# Patient Record
Sex: Female | Born: 1970 | Race: White | Hispanic: No | Marital: Married | Smoking: Never smoker
Health system: Southern US, Community
[De-identification: ages and names within clinical notes are randomized; demographics above are authoritative.]

## PROBLEM LIST (undated history)

## (undated) ENCOUNTER — Ambulatory Visit: Payer: PRIVATE HEALTH INSURANCE

## (undated) ENCOUNTER — Encounter: Attending: Oncology | Primary: Oncology

## (undated) ENCOUNTER — Encounter

## (undated) ENCOUNTER — Ambulatory Visit

## (undated) ENCOUNTER — Encounter: Attending: Adult Health | Primary: Adult Health

## (undated) ENCOUNTER — Telehealth: Attending: Adult Health | Primary: Adult Health

## (undated) ENCOUNTER — Encounter: Attending: Primary Care | Primary: Primary Care

## (undated) ENCOUNTER — Telehealth
Attending: Pharmacist Clinician (PhC)/ Clinical Pharmacy Specialist | Primary: Pharmacist Clinician (PhC)/ Clinical Pharmacy Specialist

## (undated) ENCOUNTER — Telehealth: Attending: Internal Medicine | Primary: Internal Medicine

## (undated) ENCOUNTER — Encounter: Attending: Critical Care Medicine | Primary: Critical Care Medicine

## (undated) ENCOUNTER — Telehealth

## (undated) ENCOUNTER — Encounter: Attending: Internal Medicine | Primary: Internal Medicine

## (undated) ENCOUNTER — Encounter
Attending: Pharmacist Clinician (PhC)/ Clinical Pharmacy Specialist | Primary: Pharmacist Clinician (PhC)/ Clinical Pharmacy Specialist

## (undated) ENCOUNTER — Telehealth
Attending: Student in an Organized Health Care Education/Training Program | Primary: Student in an Organized Health Care Education/Training Program

## (undated) ENCOUNTER — Telehealth: Attending: Oncology | Primary: Oncology

## (undated) ENCOUNTER — Encounter: Attending: Nurse Practitioner | Primary: Nurse Practitioner

## (undated) ENCOUNTER — Inpatient Hospital Stay

## (undated) ENCOUNTER — Encounter: Attending: Family | Primary: Family

## (undated) ENCOUNTER — Ambulatory Visit: Payer: PRIVATE HEALTH INSURANCE | Attending: Adult Health | Primary: Adult Health

## (undated) ENCOUNTER — Ambulatory Visit: Payer: PRIVATE HEALTH INSURANCE | Attending: Internal Medicine | Primary: Internal Medicine

## (undated) ENCOUNTER — Ambulatory Visit: Payer: PRIVATE HEALTH INSURANCE | Attending: Clinical | Primary: Clinical

## (undated) ENCOUNTER — Ambulatory Visit: Attending: Internal Medicine | Primary: Internal Medicine

## (undated) DIAGNOSIS — N83209 Unspecified ovarian cyst, unspecified side: Secondary | ICD-10-CM

## (undated) DIAGNOSIS — D509 Iron deficiency anemia, unspecified: Secondary | ICD-10-CM

## (undated) DIAGNOSIS — N762 Acute vulvitis: Secondary | ICD-10-CM

## (undated) DIAGNOSIS — B977 Papillomavirus as the cause of diseases classified elsewhere: Secondary | ICD-10-CM

## (undated) DIAGNOSIS — D75839 Thrombocytosis, unspecified: Secondary | ICD-10-CM

## (undated) DIAGNOSIS — N6019 Diffuse cystic mastopathy of unspecified breast: Secondary | ICD-10-CM

## (undated) DIAGNOSIS — Z8619 Personal history of other infectious and parasitic diseases: Secondary | ICD-10-CM

## (undated) DIAGNOSIS — B3731 Acute candidiasis of vulva and vagina: Secondary | ICD-10-CM

## (undated) DIAGNOSIS — D7581 Myelofibrosis: Secondary | ICD-10-CM

## (undated) DIAGNOSIS — B379 Candidiasis, unspecified: Secondary | ICD-10-CM

## (undated) DIAGNOSIS — D473 Essential (hemorrhagic) thrombocythemia: Secondary | ICD-10-CM

## (undated) DIAGNOSIS — K219 Gastro-esophageal reflux disease without esophagitis: Secondary | ICD-10-CM

## (undated) DIAGNOSIS — A499 Bacterial infection, unspecified: Secondary | ICD-10-CM

## (undated) DIAGNOSIS — B373 Candidiasis of vulva and vagina: Secondary | ICD-10-CM

## (undated) DIAGNOSIS — C4491 Basal cell carcinoma of skin, unspecified: Secondary | ICD-10-CM

## (undated) HISTORY — DX: Bacterial infection, unspecified: A49.9

## (undated) HISTORY — DX: Basal cell carcinoma of skin, unspecified: C44.91

## (undated) HISTORY — DX: Personal history of other infectious and parasitic diseases: Z86.19

## (undated) HISTORY — DX: Acute vulvitis: N76.2

## (undated) HISTORY — DX: Thrombocytosis, unspecified: D75.839

## (undated) HISTORY — DX: Myelofibrosis: D75.81

## (undated) HISTORY — DX: Gastro-esophageal reflux disease without esophagitis: K21.9

## (undated) HISTORY — DX: Candidiasis of vulva and vagina: B37.3

## (undated) HISTORY — DX: Unspecified ovarian cyst, unspecified side: N83.209

## (undated) HISTORY — PX: BONE MARROW BIOPSY: SHX199

## (undated) HISTORY — DX: Papillomavirus as the cause of diseases classified elsewhere: B97.7

## (undated) HISTORY — DX: Essential (hemorrhagic) thrombocythemia: D47.3

## (undated) HISTORY — DX: Candidiasis, unspecified: B37.9

## (undated) HISTORY — DX: Iron deficiency anemia, unspecified: D50.9

## (undated) HISTORY — DX: Diffuse cystic mastopathy of unspecified breast: N60.19

## (undated) HISTORY — DX: Acute candidiasis of vulva and vagina: B37.31

## (undated) MED ORDER — OXYCODONE 5 MG TABLET: 0 days

## (undated) MED ORDER — CLOBETASOL 0.05 % TOPICAL OINTMENT: 0 days

## (undated) MED ORDER — ASPIRIN 81 MG TABLET,DELAYED RELEASE: 0 days

## (undated) MED ORDER — ESTRADIOL 4 MCG VAGINAL INSERT: 0 days

## (undated) MED ORDER — RUXOLITINIB 20 MG TABLET
Freq: Two times a day (BID) | ORAL | 0 days
Start: ? — End: 2020-01-01

## (undated) MED ORDER — OMEPRAZOLE 20 MG CAPSULE,DELAYED RELEASE: Freq: Every day | ORAL | 0 days

---

## 1997-04-03 ENCOUNTER — Other Ambulatory Visit: Admission: RE | Admit: 1997-04-03 | Discharge: 1997-04-03 | Payer: Self-pay | Admitting: Gynecology

## 1998-03-05 ENCOUNTER — Other Ambulatory Visit: Admission: RE | Admit: 1998-03-05 | Discharge: 1998-03-05 | Payer: Self-pay | Admitting: Obstetrics and Gynecology

## 1999-03-04 ENCOUNTER — Other Ambulatory Visit: Admission: RE | Admit: 1999-03-04 | Discharge: 1999-03-04 | Payer: Self-pay | Admitting: Obstetrics and Gynecology

## 1999-08-28 ENCOUNTER — Inpatient Hospital Stay (HOSPITAL_COMMUNITY): Admission: AD | Admit: 1999-08-28 | Discharge: 1999-08-28 | Payer: Self-pay | Admitting: Obstetrics and Gynecology

## 1999-09-22 ENCOUNTER — Inpatient Hospital Stay (HOSPITAL_COMMUNITY): Admission: AD | Admit: 1999-09-22 | Discharge: 1999-09-22 | Payer: Self-pay | Admitting: Obstetrics & Gynecology

## 1999-09-22 ENCOUNTER — Inpatient Hospital Stay (HOSPITAL_COMMUNITY): Admission: AD | Admit: 1999-09-22 | Discharge: 1999-09-25 | Payer: Self-pay | Admitting: Obstetrics and Gynecology

## 1999-10-21 ENCOUNTER — Encounter: Admission: RE | Admit: 1999-10-21 | Discharge: 2000-01-19 | Payer: Self-pay | Admitting: Obstetrics and Gynecology

## 2000-03-11 ENCOUNTER — Other Ambulatory Visit: Admission: RE | Admit: 2000-03-11 | Discharge: 2000-03-11 | Payer: Self-pay | Admitting: Obstetrics and Gynecology

## 2001-06-28 ENCOUNTER — Encounter (INDEPENDENT_AMBULATORY_CARE_PROVIDER_SITE_OTHER): Payer: Self-pay | Admitting: Specialist

## 2001-06-28 ENCOUNTER — Ambulatory Visit (HOSPITAL_COMMUNITY): Admission: RE | Admit: 2001-06-28 | Discharge: 2001-06-28 | Payer: Self-pay | Admitting: Oncology

## 2001-10-13 ENCOUNTER — Other Ambulatory Visit: Admission: RE | Admit: 2001-10-13 | Discharge: 2001-10-13 | Payer: Self-pay | Admitting: Obstetrics and Gynecology

## 2001-12-15 ENCOUNTER — Ambulatory Visit: Admission: RE | Admit: 2001-12-15 | Discharge: 2001-12-15 | Payer: Self-pay | Admitting: Obstetrics and Gynecology

## 2002-02-07 ENCOUNTER — Inpatient Hospital Stay (HOSPITAL_COMMUNITY): Admission: AD | Admit: 2002-02-07 | Discharge: 2002-02-07 | Payer: Self-pay | Admitting: Obstetrics and Gynecology

## 2002-03-02 ENCOUNTER — Inpatient Hospital Stay (HOSPITAL_COMMUNITY): Admission: AD | Admit: 2002-03-02 | Discharge: 2002-03-02 | Payer: Self-pay | Admitting: Obstetrics and Gynecology

## 2002-04-27 ENCOUNTER — Inpatient Hospital Stay (HOSPITAL_COMMUNITY): Admission: AD | Admit: 2002-04-27 | Discharge: 2002-04-29 | Payer: Self-pay | Admitting: Obstetrics and Gynecology

## 2002-10-19 ENCOUNTER — Other Ambulatory Visit: Admission: RE | Admit: 2002-10-19 | Discharge: 2002-10-19 | Payer: Self-pay | Admitting: Obstetrics and Gynecology

## 2003-05-23 ENCOUNTER — Encounter: Admission: RE | Admit: 2003-05-23 | Discharge: 2003-05-23 | Payer: Self-pay | Admitting: Oncology

## 2003-10-17 ENCOUNTER — Other Ambulatory Visit: Admission: RE | Admit: 2003-10-17 | Discharge: 2003-10-17 | Payer: Self-pay | Admitting: Obstetrics and Gynecology

## 2004-03-07 ENCOUNTER — Ambulatory Visit: Payer: Self-pay | Admitting: Oncology

## 2004-03-18 ENCOUNTER — Encounter: Admission: RE | Admit: 2004-03-18 | Discharge: 2004-03-18 | Payer: Self-pay | Admitting: Oncology

## 2004-03-31 ENCOUNTER — Encounter: Admission: RE | Admit: 2004-03-31 | Discharge: 2004-03-31 | Payer: Self-pay | Admitting: Emergency Medicine

## 2004-06-27 ENCOUNTER — Ambulatory Visit: Payer: Self-pay | Admitting: Oncology

## 2004-08-22 ENCOUNTER — Ambulatory Visit: Payer: Self-pay | Admitting: Oncology

## 2004-12-11 ENCOUNTER — Other Ambulatory Visit: Admission: RE | Admit: 2004-12-11 | Discharge: 2004-12-11 | Payer: Self-pay | Admitting: Obstetrics and Gynecology

## 2005-01-22 ENCOUNTER — Ambulatory Visit: Payer: Self-pay | Admitting: Oncology

## 2005-09-23 ENCOUNTER — Ambulatory Visit: Payer: Self-pay | Admitting: Oncology

## 2005-10-02 LAB — CBC WITH DIFFERENTIAL/PLATELET
Basophils Absolute: 0 10*3/uL (ref 0.0–0.1)
Eosinophils Absolute: 0.1 10*3/uL (ref 0.0–0.5)
HGB: 11.5 g/dL — ABNORMAL LOW (ref 11.6–15.9)
MCV: 79.9 fL — ABNORMAL LOW (ref 81.0–101.0)
MONO#: 0.3 10*3/uL (ref 0.1–0.9)
MONO%: 3.6 % (ref 0.0–13.0)
NEUT#: 6.1 10*3/uL (ref 1.5–6.5)
Platelets: 678 10*3/uL — ABNORMAL HIGH (ref 145–400)
RBC: 4.32 10*6/uL (ref 3.70–5.32)
RDW: 17.2 % — ABNORMAL HIGH (ref 11.3–14.5)
WBC: 7.5 10*3/uL (ref 3.9–10.0)

## 2005-12-29 ENCOUNTER — Ambulatory Visit: Payer: Self-pay | Admitting: Oncology

## 2006-01-01 LAB — CBC WITH DIFFERENTIAL/PLATELET
BASO%: 0.4 % (ref 0.0–2.0)
Basophils Absolute: 0 10*3/uL (ref 0.0–0.1)
EOS%: 1.3 % (ref 0.0–7.0)
HCT: 33.5 % — ABNORMAL LOW (ref 34.8–46.6)
HGB: 11.2 g/dL — ABNORMAL LOW (ref 11.6–15.9)
MONO#: 0.3 10*3/uL (ref 0.1–0.9)
NEUT%: 78.5 % — ABNORMAL HIGH (ref 39.6–76.8)
RDW: 17.3 % — ABNORMAL HIGH (ref 11.3–14.5)
WBC: 6.4 10*3/uL (ref 3.9–10.0)
lymph#: 1 10*3/uL (ref 0.9–3.3)

## 2006-03-30 ENCOUNTER — Ambulatory Visit: Payer: Self-pay | Admitting: Oncology

## 2006-04-02 LAB — CBC WITH DIFFERENTIAL/PLATELET
Basophils Absolute: 0 10*3/uL (ref 0.0–0.1)
Eosinophils Absolute: 0.1 10*3/uL (ref 0.0–0.5)
HCT: 30.8 % — ABNORMAL LOW (ref 34.8–46.6)
LYMPH%: 15.4 % (ref 14.0–48.0)
MCV: 78.1 fL — ABNORMAL LOW (ref 81.0–101.0)
MONO#: 0.1 10*3/uL (ref 0.1–0.9)
MONO%: 1.9 % (ref 0.0–13.0)
NEUT#: 5 10*3/uL (ref 1.5–6.5)
NEUT%: 80 % — ABNORMAL HIGH (ref 39.6–76.8)
Platelets: 523 10*3/uL — ABNORMAL HIGH (ref 145–400)
WBC: 6.3 10*3/uL (ref 3.9–10.0)

## 2006-04-27 ENCOUNTER — Encounter: Admission: RE | Admit: 2006-04-27 | Discharge: 2006-04-27 | Payer: Self-pay | Admitting: Family Medicine

## 2006-05-12 LAB — CBC WITH DIFFERENTIAL/PLATELET
Basophils Absolute: 0.1 10*3/uL (ref 0.0–0.1)
Eosinophils Absolute: 0.1 10*3/uL (ref 0.0–0.5)
LYMPH%: 14.4 % (ref 14.0–48.0)
MCV: 78.6 fL — ABNORMAL LOW (ref 81.0–101.0)
MONO%: 3.3 % (ref 0.0–13.0)
NEUT#: 6.4 10*3/uL (ref 1.5–6.5)
Platelets: 628 10*3/uL — ABNORMAL HIGH (ref 145–400)
RBC: 4.38 10*6/uL (ref 3.70–5.32)

## 2006-10-27 ENCOUNTER — Ambulatory Visit: Payer: Self-pay | Admitting: Oncology

## 2006-10-29 LAB — CBC WITH DIFFERENTIAL/PLATELET
BASO%: 0.5 % (ref 0.0–2.0)
EOS%: 2.2 % (ref 0.0–7.0)
LYMPH%: 13.9 % — ABNORMAL LOW (ref 14.0–48.0)
MCHC: 33.7 g/dL (ref 32.0–36.0)
MCV: 80.4 fL — ABNORMAL LOW (ref 81.0–101.0)
MONO%: 3.6 % (ref 0.0–13.0)
Platelets: 561 10*3/uL — ABNORMAL HIGH (ref 145–400)
RBC: 4.17 10*6/uL (ref 3.70–5.32)
WBC: 5.9 10*3/uL (ref 3.9–10.0)

## 2006-12-03 ENCOUNTER — Encounter: Admission: RE | Admit: 2006-12-03 | Discharge: 2006-12-03 | Payer: Self-pay | Admitting: Obstetrics and Gynecology

## 2007-02-25 ENCOUNTER — Ambulatory Visit: Payer: Self-pay | Admitting: Oncology

## 2007-03-03 LAB — CBC WITH DIFFERENTIAL/PLATELET
BASO%: 0.8 % (ref 0.0–2.0)
EOS%: 1.9 % (ref 0.0–7.0)
HCT: 33 % — ABNORMAL LOW (ref 34.8–46.6)
MCH: 26.9 pg (ref 26.0–34.0)
MCHC: 33.4 g/dL (ref 32.0–36.0)
MONO#: 0.3 10*3/uL (ref 0.1–0.9)
RBC: 4.09 10*6/uL (ref 3.70–5.32)
RDW: 17.9 % — ABNORMAL HIGH (ref 11.3–14.5)
WBC: 5.4 10*3/uL (ref 3.9–10.0)
lymph#: 0.8 10*3/uL — ABNORMAL LOW (ref 0.9–3.3)

## 2007-04-30 ENCOUNTER — Emergency Department (HOSPITAL_COMMUNITY): Admission: EM | Admit: 2007-04-30 | Discharge: 2007-04-30 | Payer: Self-pay | Admitting: Emergency Medicine

## 2007-06-28 ENCOUNTER — Ambulatory Visit: Payer: Self-pay | Admitting: Oncology

## 2007-06-30 LAB — CBC WITH DIFFERENTIAL/PLATELET
HGB: 12.7 g/dL (ref 11.6–15.9)
LYMPH%: 13.6 % — ABNORMAL LOW (ref 14.0–48.0)
MCH: 26.7 pg (ref 26.0–34.0)
MCHC: 33.8 g/dL (ref 32.0–36.0)
MONO#: 0.3 10*3/uL (ref 0.1–0.9)
NEUT%: 80.3 % — ABNORMAL HIGH (ref 39.6–76.8)
RBC: 4.74 10*6/uL (ref 3.70–5.32)
RDW: 17.9 % — ABNORMAL HIGH (ref 11.3–14.5)
lymph#: 1 10*3/uL (ref 0.9–3.3)

## 2007-12-30 ENCOUNTER — Ambulatory Visit: Payer: Self-pay | Admitting: Oncology

## 2008-03-01 ENCOUNTER — Ambulatory Visit: Payer: Self-pay | Admitting: Oncology

## 2008-06-28 ENCOUNTER — Ambulatory Visit: Payer: Self-pay | Admitting: Oncology

## 2008-07-02 LAB — CBC WITH DIFFERENTIAL/PLATELET
Basophils Absolute: 0.1 10*3/uL (ref 0.0–0.1)
EOS%: 1.8 % (ref 0.0–7.0)
LYMPH%: 16 % (ref 14.0–49.7)
MCHC: 30.9 g/dL — ABNORMAL LOW (ref 31.5–36.0)
MCV: 81.2 fL (ref 79.5–101.0)
MONO%: 5.2 % (ref 0.0–14.0)
NEUT#: 4.9 10*3/uL (ref 1.5–6.5)
NEUT%: 75.9 % (ref 38.4–76.8)
RDW: 17.5 % — ABNORMAL HIGH (ref 11.2–14.5)

## 2008-07-23 ENCOUNTER — Encounter: Admission: RE | Admit: 2008-07-23 | Discharge: 2008-07-23 | Payer: Self-pay | Admitting: Obstetrics and Gynecology

## 2008-07-24 LAB — CBC WITH DIFFERENTIAL/PLATELET
Basophils Absolute: 0 10*3/uL (ref 0.0–0.1)
EOS%: 2.5 % (ref 0.0–7.0)
Eosinophils Absolute: 0.2 10*3/uL (ref 0.0–0.5)
MCH: 27.5 pg (ref 25.1–34.0)
NEUT#: 5 10*3/uL (ref 1.5–6.5)
Platelets: 500 10*3/uL — ABNORMAL HIGH (ref 145–400)
WBC: 6.4 10*3/uL (ref 3.9–10.3)
lymph#: 0.9 10*3/uL (ref 0.9–3.3)

## 2008-07-24 LAB — ERYTHROCYTE SEDIMENTATION RATE: Sed Rate: 3 mm/hr (ref 0–30)

## 2008-10-02 ENCOUNTER — Ambulatory Visit: Payer: Self-pay | Admitting: Oncology

## 2008-10-04 LAB — CBC WITH DIFFERENTIAL/PLATELET
Basophils Absolute: 0 10*3/uL (ref 0.0–0.1)
EOS%: 1.3 % (ref 0.0–7.0)
Eosinophils Absolute: 0.1 10*3/uL (ref 0.0–0.5)
MONO#: 0.2 10*3/uL (ref 0.1–0.9)
MONO%: 3.8 % (ref 0.0–14.0)
NEUT#: 5.5 10*3/uL (ref 1.5–6.5)
WBC: 6.6 10*3/uL (ref 3.9–10.3)

## 2008-10-05 LAB — IRON AND TIBC
%SAT: 22 % (ref 20–55)
Iron: 52 ug/dL (ref 42–145)

## 2008-10-05 LAB — FERRITIN: Ferritin: 79 ng/mL (ref 10–291)

## 2009-02-12 ENCOUNTER — Ambulatory Visit: Payer: Self-pay | Admitting: Oncology

## 2009-02-14 LAB — CBC WITH DIFFERENTIAL/PLATELET
Eosinophils Absolute: 0.1 10*3/uL (ref 0.0–0.5)
MCHC: 32.8 g/dL (ref 31.5–36.0)
MONO#: 0.4 10*3/uL (ref 0.1–0.9)
MONO%: 4.1 % (ref 0.0–14.0)
RBC: 4.26 10*6/uL (ref 3.70–5.45)
WBC: 10.6 10*3/uL — ABNORMAL HIGH (ref 3.9–10.3)
lymph#: 1.1 10*3/uL (ref 0.9–3.3)

## 2009-07-29 ENCOUNTER — Encounter: Admission: RE | Admit: 2009-07-29 | Discharge: 2009-07-29 | Payer: Self-pay | Admitting: Obstetrics and Gynecology

## 2009-08-13 ENCOUNTER — Ambulatory Visit (HOSPITAL_BASED_OUTPATIENT_CLINIC_OR_DEPARTMENT_OTHER): Payer: BC Managed Care – PPO | Admitting: Oncology

## 2009-08-15 LAB — CBC WITH DIFFERENTIAL/PLATELET
BASO%: 0.1 % (ref 0.0–2.0)
Basophils Absolute: 0 10*3/uL (ref 0.0–0.1)
Eosinophils Absolute: 0.1 10*3/uL (ref 0.0–0.5)
MCHC: 32.4 g/dL (ref 31.5–36.0)
MCV: 82.3 fL (ref 79.5–101.0)
MONO#: 0.2 10*3/uL (ref 0.1–0.9)
MONO%: 3 % (ref 0.0–14.0)
NEUT#: 5.8 10*3/uL (ref 1.5–6.5)
lymph#: 0.8 10*3/uL — ABNORMAL LOW (ref 0.9–3.3)

## 2009-11-12 HISTORY — PX: SHOULDER SURGERY: SHX246

## 2010-02-13 ENCOUNTER — Encounter: Payer: BC Managed Care – PPO | Admitting: Oncology

## 2010-02-13 DIAGNOSIS — D649 Anemia, unspecified: Secondary | ICD-10-CM

## 2010-02-13 DIAGNOSIS — D473 Essential (hemorrhagic) thrombocythemia: Secondary | ICD-10-CM

## 2010-02-13 LAB — CBC WITH DIFFERENTIAL/PLATELET
EOS%: 0.9 % (ref 0.0–7.0)
MCV: 80.7 fL (ref 79.5–101.0)
MONO#: 0.3 10*3/uL (ref 0.1–0.9)
NEUT#: 5.2 10*3/uL (ref 1.5–6.5)
NEUT%: 81.8 % — ABNORMAL HIGH (ref 38.4–76.8)
WBC: 6.3 10*3/uL (ref 3.9–10.3)
lymph#: 0.8 10*3/uL — ABNORMAL LOW (ref 0.9–3.3)

## 2010-05-30 NOTE — H&P (Signed)
NAME:  Lynn Mccarthy, Lynn Mccarthy                       ACCOUNT NO.:  [id number]   MEDICAL RECORD NO.:  [id number]                   PATIENT TYPE:  INP   LOCATION:  9326                                 FACILITY:  [facility]   PHYSICIAN:  Crist Fat. Rivard, M.D.              DATE OF BIRTH:  [birth year]   DATE OF ADMISSION:  04/27/2002  DATE OF DISCHARGE:                                HISTORY & PHYSICAL   HISTORY OF PRESENT ILLNESS:  The patient is a 40 year old married white  female, gravida 2, para 1-0-0-1, at 38-5/7 weeks who presents complaining of  uterine contractions every three to four minutes at home that have been  increasing in intensity throughout the afternoon.  She reports that she was  seen in the office earlier today and was 2 and 70% at that time.  She  presented initially to maternity admissions and was found to be about 2.5  cm, 90%, vertex, -1 to 0, ambulated for approximately one hour, and was  rechecked and found to be 3, 90%, vertex, -1 and 0, and discussion was held  with her at that time concerning her options.  Initially, she decided to go  home, however, approximately 10 minutes later secondary to the discomfort of  the contractions, she requested that she be allowed to be observed further  on labor and delivery where she had access to a bathtub and birthing ball to  support her in labor.  She denies any vomiting, but is feeling slightly  nauseated.  She denies any headaches or visual disturbances.  She denies any  leaking or vaginal bleeding.  She does report positive fetal movement.  Her  pregnancy has been followed at [facility] by the certified  nurse midwife service, and has been essentially uncomplicated, though at  risk for history of thrombocytosis, splenomegaly, family history of breast  cancer, right ovarian cyst, history of migraines, history of irritable bowel  syndrome.  Her Group B Strep is negative.   OBSTETRICAL AND GYNECOLOGICAL HISTORY:  1.  She is a gravida 2, para 1-0-0-1, with a last menstrual period of     07/30/01, giving her an EDC of 05/06/02, that has been confirmed by     ultrasound.  She delivered a viable female infant in 9/01, who weighed 6     pounds 3 ounces at [redacted] weeks gestation following a 24 hour labor.  She     delivered vaginally without complication and without any anesthesia.     That infant's name is Marcial Pacas.  2. She had a right ovarian cyst in the past.  3. She was diagnosed with a right ovarian cyst in 5/03.   ALLERGIES:  No known drug allergies.   PAST MEDICAL HISTORY:  1. She reports having had the usual childhood diseases.  2. History of irritable bowel syndrome.  3. Occasional migraines.   PAST SURGICAL HISTORY:  Bone marrow biopsy in 1993  and 5/03, secondary to  her diagnosis of thrombocytosis.   FAMILY HISTORY:  Significant for paternal grandfather with myocardial  infarction, maternal grandmother and mother with hypertension, maternal  grandfather with diabetes, mother and maternal grandmother with cancer,  maternal grandmother with kidney failure, and family history of depression.   GENETIC HISTORY:  Negative.   SOCIAL HISTORY:  She is married to [employer] who is involved and  supportive.  He is employed full-time as a Teacher, adult education, as is she.  They deny any illicit drug use, alcohol, or smoking with this pregnancy.  They deny any religious affiliation that effects their care.   LABORATORY DATA:  Her blood type is A positive, her antibody screen is  negative.  Syphilis is nonreactive.  Rubella is positive.  Hepatitis B  surface antigen is negative.  HIV is nonreactive.  GC and Chlamydia are both  negative.  Pap is within normal limits.  Cystic fibrosis screen was  negative.  Serum ferratin was normal.  Her platelets had been elevated  throughout her pregnancy.  Her one hour Glucola was 110.  Maternal serum  alpha fetoprotein as within normal range.  Her Group B Strep was negative  at  36 weeks.   PHYSICAL EXAMINATION:  VITAL SIGNS:  Stable.  She is afebrile.  HEENT:  Grossly within normal limits.  HEART:  Regular rate and rhythm.  CHEST:  Clear.  BREASTS:  Soft and nontender.  ABDOMEN:  Gravid, uterine contractions every three to five minutes.  Her  fetal heart rate is reactive and reassuring.  PELVIC:  Last examination was 3, 90%, vertex, -1 to 0.  EXTREMITIES:  Within normal limits.   ASSESSMENT:  1. Intrauterine pregnancy at term.  2. Probable early labor.  3. Negative Group B Strep.   PLAN:  Admit to labor and delivery for further observation and probable  labor management.  To follow routine CNM orders, and to notify Dr. Estanislado Pandy.     Concha Pyo. Duplantis, C.N.M.              Crist Fat Rivard, M.D.    SJD/MEDQ  D:  04/28/2002  T:  04/28/2002  Job:  [id number]

## 2010-08-14 ENCOUNTER — Encounter (HOSPITAL_BASED_OUTPATIENT_CLINIC_OR_DEPARTMENT_OTHER): Payer: BC Managed Care – PPO | Admitting: Oncology

## 2010-08-14 ENCOUNTER — Other Ambulatory Visit: Payer: Self-pay | Admitting: Oncology

## 2010-08-14 DIAGNOSIS — D649 Anemia, unspecified: Secondary | ICD-10-CM

## 2010-08-14 DIAGNOSIS — D473 Essential (hemorrhagic) thrombocythemia: Secondary | ICD-10-CM

## 2010-08-14 LAB — CBC WITH DIFFERENTIAL/PLATELET
Basophils Absolute: 0 10*3/uL (ref 0.0–0.1)
LYMPH%: 12.3 % — ABNORMAL LOW (ref 14.0–49.7)
MCH: 27.2 pg (ref 25.1–34.0)
MCHC: 33.6 g/dL (ref 31.5–36.0)
MCV: 81.1 fL (ref 79.5–101.0)
MONO#: 0.2 10*3/uL (ref 0.1–0.9)
MONO%: 3.3 % (ref 0.0–14.0)
NEUT#: 4.9 10*3/uL (ref 1.5–6.5)
NEUT%: 82.7 % — ABNORMAL HIGH (ref 38.4–76.8)
Platelets: 411 10*3/uL — ABNORMAL HIGH (ref 145–400)

## 2010-08-18 ENCOUNTER — Other Ambulatory Visit: Payer: Self-pay | Admitting: Obstetrics and Gynecology

## 2010-08-18 DIAGNOSIS — Z1231 Encounter for screening mammogram for malignant neoplasm of breast: Secondary | ICD-10-CM

## 2010-08-26 ENCOUNTER — Ambulatory Visit: Payer: BC Managed Care – PPO

## 2010-08-26 ENCOUNTER — Ambulatory Visit
Admission: RE | Admit: 2010-08-26 | Discharge: 2010-08-26 | Disposition: A | Discharge status: Home / Self Care | Payer: BC Managed Care – PPO | Source: Ambulatory Visit | Attending: Obstetrics and Gynecology | Admitting: Obstetrics and Gynecology

## 2010-08-26 DIAGNOSIS — Z1231 Encounter for screening mammogram for malignant neoplasm of breast: Secondary | ICD-10-CM

## 2011-02-21 ENCOUNTER — Telehealth: Payer: Self-pay | Admitting: Oncology

## 2011-02-21 NOTE — Telephone Encounter (Signed)
lmonvm for pt re appt for 8/2. Schedule mailed.

## 2011-06-13 DIAGNOSIS — C4491 Basal cell carcinoma of skin, unspecified: Secondary | ICD-10-CM

## 2011-06-13 HISTORY — DX: Basal cell carcinoma of skin, unspecified: C44.91

## 2011-07-29 ENCOUNTER — Other Ambulatory Visit: Payer: Self-pay | Admitting: Obstetrics and Gynecology

## 2011-07-29 DIAGNOSIS — Z1231 Encounter for screening mammogram for malignant neoplasm of breast: Secondary | ICD-10-CM

## 2011-08-14 ENCOUNTER — Ambulatory Visit: Payer: BC Managed Care – PPO | Admitting: Oncology

## 2011-08-14 ENCOUNTER — Telehealth: Payer: Self-pay | Admitting: Oncology

## 2011-08-14 ENCOUNTER — Other Ambulatory Visit (HOSPITAL_BASED_OUTPATIENT_CLINIC_OR_DEPARTMENT_OTHER): Payer: BC Managed Care – PPO | Admitting: Lab

## 2011-08-14 ENCOUNTER — Ambulatory Visit (HOSPITAL_BASED_OUTPATIENT_CLINIC_OR_DEPARTMENT_OTHER): Payer: BC Managed Care – PPO | Admitting: Nurse Practitioner

## 2011-08-14 VITALS — BP 120/78 | HR 77 | Temp 97.8°F | Resp 20 | Ht 65.5 in | Wt 152.2 lb

## 2011-08-14 DIAGNOSIS — D649 Anemia, unspecified: Secondary | ICD-10-CM

## 2011-08-14 DIAGNOSIS — D473 Essential (hemorrhagic) thrombocythemia: Secondary | ICD-10-CM

## 2011-08-14 LAB — CBC WITH DIFFERENTIAL/PLATELET
BASO%: 0.9 % (ref 0.0–2.0)
MCHC: 32.3 g/dL (ref 31.5–36.0)
MONO#: 0.3 10*3/uL (ref 0.1–0.9)
RBC: 4.19 10*6/uL (ref 3.70–5.45)
WBC: 8.2 10*3/uL (ref 3.9–10.3)
lymph#: 0.9 10*3/uL (ref 0.9–3.3)

## 2011-08-14 NOTE — Progress Notes (Signed)
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 She is seen today for scheduled followup. She denies symptoms of venous and arterial thrombosis. She denies abdominal pain. No nausea. She intermittently notes fullness at the left abdomen. No fevers or sweats. She has a good appetite. Stable dyspnea on exertion.   Objective: There were no vitals taken for this visit.  Oropharynx is without thrush or ulceration. No palpable cervical, supraclavicular or axillary lymph nodes. Lungs are clear. No wheezes or rales. Regular cardiac rhythm. Abdomen is soft and nontender. No hepatomegaly. Spleen is palpable in the left mid abdomen approximately 2 fingerbreadths above the umbilicus. Extremities are without edema. Calves are soft and nontender.  Lab Results: Lab Results  Component Value Date   WBC 8.2 08/14/2011   HGB 11.1* 08/14/2011   HCT 34.3* 08/14/2011   MCV 82.0 08/14/2011   PLT 431* 08/14/2011    Chemistry:    Chemistry   No results found for this basename: NA, K, CL, CO2, BUN, CREATININE, GLU   No results found for this basename: CALCIUM, ALKPHOS, AST, ALT, BILITOT       Studies/Results: No results found.  Medications: I have reviewed the patient's current medications.  Assessment/Plan:  1. Essential thrombocytosis. She has stable mild thrombocytosis and stable splenomegaly. 2. Microcytic anemia. Improved with the ferrous fumarate. The hemoglobin is stable.  Disposition-Lynn Mccarthy remains stable from a hematologic standpoint. She will return for a CBC in 6 months and a followup visit in one year. She will contact the office in the interim with any problems.  Plan reviewed with Dr. Truett Perna.  Lonna Cobb ANP/GNP-BC

## 2011-08-14 NOTE — Telephone Encounter (Signed)
appts made and printed for pt aom °

## 2011-08-25 ENCOUNTER — Encounter: Payer: Self-pay | Admitting: Obstetrics and Gynecology

## 2011-08-25 ENCOUNTER — Ambulatory Visit (INDEPENDENT_AMBULATORY_CARE_PROVIDER_SITE_OTHER): Payer: BC Managed Care – PPO | Admitting: Obstetrics and Gynecology

## 2011-08-25 VITALS — BP 102/60 | HR 68 | Ht 66.0 in | Wt 154.0 lb

## 2011-08-25 DIAGNOSIS — Z01419 Encounter for gynecological examination (general) (routine) without abnormal findings: Secondary | ICD-10-CM

## 2011-08-25 DIAGNOSIS — N898 Other specified noninflammatory disorders of vagina: Secondary | ICD-10-CM

## 2011-08-25 DIAGNOSIS — Z124 Encounter for screening for malignant neoplasm of cervix: Secondary | ICD-10-CM

## 2011-08-25 MED ORDER — FLUCONAZOLE 150 MG PO TABS: 150.0000 mg | ORAL_TABLET | Freq: Once | ORAL | Status: AC

## 2011-08-25 NOTE — Progress Notes (Signed)
Regular Periods: yes Mammogram: yes    Monthly Breast Ex.: no Exercise: yes  Tetanus < 10 years: yes Seatbelts: yes  NI. Bladder Functn.: yes Abuse at home: no  Daily BM's: yes Stressful Work: no  Healthy Diet: yes Sigmoid-Colonoscopy: NO  Calcium: no Medical problems this year: WANT TO BE CHECKED FOR YEAST   LAST PAP:8/12  Contraception: VASC.  Mammogram:  8/12  PCP: [facility]  PMH: NO CHANGE  FMH: NO CHANGE  Last Bone Scan: NO

## 2011-08-25 NOTE — Progress Notes (Signed)
Subjective:    Lynn Mccarthy is a 41 y.o. female, G2P2, who presents for an annual exam. The patient reports vaginal irritation since antibiotics folllow ing a skin infection from a skin biopsy (returned basal cell carcinoma)  Menstrual cycle:   LMP: Patient's last menstrual period was 08/13/2011.             Review of Systems Pertinent items are noted in HPI. Denies pelvic pain, urinary tract symptoms, vaginitis symptoms, irregular bleeding, menopausal symptoms, change in bowel habits or rectal bleeding   Objective:    BP 102/60  Pulse 68  Ht 5\' 6"  (1.676 m)  Wt 154 lb (69.854 kg)  BMI 24.86 kg/m2  LMP 08/13/2011   @Weigh @t :  Wt Readings from Last 1 Encounters:  08/25/11 154 lb (69.854 kg)   Body mass index is 24.86 kg/(m^2). General Appearance: Alert, no acute distress HEENT: Grossly normal Neck / Thyroid: Supple, no thyromegaly or cervical adenopathy Lungs: Clear to auscultation bilaterally Back: No CVA tenderness Breast Exam: No masses or nodes.No dimpling, nipple retraction or discharge. Cardiovascular: Regular rate and rhythm.  Gastrointestinal: Soft, non-tender, no masses or organomegaly Pelvic Exam: EGBUS-wnl, vagina-normal rugae, cervix- without lesions or tenderness, uterus appears normal size shape and consistency, adnexae-no masses or tenderness Rectovaginal: no masses and normal sphincter tone Lymphatic Exam: Non-palpable nodes in neck, clavicular,  axillary, or inguinal regions  Skin: no rashes or abnormalities Extremities: no clubbing cyanosis or edema  Neurologic: grossly normal Psychiatric: Alert and oriented  Wet Prep: Urinalysis: UPT:    Assessment:   Routine GYN Exam   Plan:    PAP sent RTO 1 year or prn  Kate Larock,ELMIRAPA-C

## 2011-08-27 ENCOUNTER — Ambulatory Visit: Payer: BC Managed Care – PPO

## 2011-08-28 LAB — PAP IG W/ RFLX HPV ASCU

## 2011-12-15 NOTE — Progress Notes (Signed)
Pt failed to report for new patient visit

## 2012-02-12 ENCOUNTER — Telehealth: Payer: Self-pay | Admitting: Oncology

## 2012-02-12 NOTE — Telephone Encounter (Signed)
Pt called, returned call and left message regarding lab on February 2014

## 2012-02-15 ENCOUNTER — Other Ambulatory Visit: Payer: BC Managed Care – PPO | Admitting: Lab

## 2012-02-16 ENCOUNTER — Other Ambulatory Visit (HOSPITAL_BASED_OUTPATIENT_CLINIC_OR_DEPARTMENT_OTHER): Payer: BC Managed Care – PPO | Admitting: Lab

## 2012-02-16 DIAGNOSIS — D473 Essential (hemorrhagic) thrombocythemia: Secondary | ICD-10-CM

## 2012-02-16 LAB — CBC WITH DIFFERENTIAL/PLATELET
Basophils Absolute: 0.1 10*3/uL (ref 0.0–0.1)
EOS%: 1 % (ref 0.0–7.0)
Eosinophils Absolute: 0.1 10*3/uL (ref 0.0–0.5)
HCT: 33.3 % — ABNORMAL LOW (ref 34.8–46.6)
HGB: 10.3 g/dL — ABNORMAL LOW (ref 11.6–15.9)
MCH: 25.5 pg (ref 25.1–34.0)
MCV: 82.4 fL (ref 79.5–101.0)
MONO%: 5 % (ref 0.0–14.0)
NEUT#: 5.6 10*3/uL (ref 1.5–6.5)
NEUT%: 78.5 % — ABNORMAL HIGH (ref 38.4–76.8)
Platelets: 368 10*3/uL (ref 145–400)
RDW: 18.1 % — ABNORMAL HIGH (ref 11.2–14.5)

## 2012-02-16 LAB — TECHNOLOGIST REVIEW

## 2012-02-17 ENCOUNTER — Telehealth: Payer: Self-pay | Admitting: Oncology

## 2012-02-17 ENCOUNTER — Telehealth: Payer: Self-pay | Admitting: *Deleted

## 2012-02-17 DIAGNOSIS — D473 Essential (hemorrhagic) thrombocythemia: Secondary | ICD-10-CM

## 2012-02-17 NOTE — Telephone Encounter (Signed)
Message copied by Wandalee Ferdinand on Wed Feb 17, 2012  2:52 PM ------      Message from: Ladene Artist      Created: Tue Feb 16, 2012  8:42 PM       Please call patient, hb is slightly lower, check cbc 3 months

## 2012-02-17 NOTE — Telephone Encounter (Signed)
lmonvm for pt re appt for 5/5 and mailed schedule.

## 2012-02-17 NOTE — Telephone Encounter (Signed)
Left VM with all results. Recheck counts in 3 months. Call for appointment.

## 2012-05-13 ENCOUNTER — Telehealth: Payer: Self-pay | Admitting: Oncology

## 2012-05-13 NOTE — Telephone Encounter (Signed)
returned pt call and advised on new d/t per pt request.

## 2012-05-16 ENCOUNTER — Ambulatory Visit: Payer: BC Managed Care – PPO | Admitting: Oncology

## 2012-05-16 ENCOUNTER — Other Ambulatory Visit: Payer: BC Managed Care – PPO | Admitting: Lab

## 2012-05-24 ENCOUNTER — Ambulatory Visit: Payer: BC Managed Care – PPO | Admitting: Sports Medicine

## 2012-07-07 ENCOUNTER — Other Ambulatory Visit: Payer: Self-pay | Admitting: Dermatology

## 2012-07-11 ENCOUNTER — Other Ambulatory Visit (HOSPITAL_BASED_OUTPATIENT_CLINIC_OR_DEPARTMENT_OTHER): Payer: BC Managed Care – PPO | Admitting: Lab

## 2012-07-11 ENCOUNTER — Telehealth: Payer: Self-pay | Admitting: Oncology

## 2012-07-11 ENCOUNTER — Ambulatory Visit (HOSPITAL_BASED_OUTPATIENT_CLINIC_OR_DEPARTMENT_OTHER): Payer: BC Managed Care – PPO | Admitting: Oncology

## 2012-07-11 VITALS — BP 105/65 | HR 74 | Temp 99.0°F | Resp 20 | Ht 66.0 in | Wt 156.3 lb

## 2012-07-11 DIAGNOSIS — D649 Anemia, unspecified: Secondary | ICD-10-CM

## 2012-07-11 DIAGNOSIS — D473 Essential (hemorrhagic) thrombocythemia: Secondary | ICD-10-CM

## 2012-07-11 LAB — CBC WITH DIFFERENTIAL/PLATELET
Basophils Absolute: 0.1 10*3/uL (ref 0.0–0.1)
HCT: 32 % — ABNORMAL LOW (ref 34.8–46.6)
HGB: 10.6 g/dL — ABNORMAL LOW (ref 11.6–15.9)
LYMPH%: 9.4 % — ABNORMAL LOW (ref 14.0–49.7)
MCH: 26.6 pg (ref 25.1–34.0)
MONO#: 0.3 10*3/uL (ref 0.1–0.9)
NEUT%: 84.4 % — ABNORMAL HIGH (ref 38.4–76.8)
Platelets: 378 10*3/uL (ref 145–400)
lymph#: 0.7 10*3/uL — ABNORMAL LOW (ref 0.9–3.3)

## 2012-07-11 NOTE — Progress Notes (Signed)
    [facility]    OFFICE PROGRESS NOTE   INTERVAL HISTORY:   She returns as scheduled. She reports stable malaise and exertional dyspnea. No fever, night sweats, or anorexia. She strained her upper back while "pulling "weeds last weekend. She continues once daily iron. She has a menstrual cycle. She is not bothered by the splenomegaly.  Objective:  Vital signs in last 24 hours:  Blood pressure 105/65, pulse 74, temperature 99 F (37.2 C), temperature source Oral, resp. rate 20, height 5\' 6"  (1.676 m), weight 156 lb 4.8 oz (70.897 kg).  Resp: Lungs clear bilaterally Cardio: Regular rate and rhythm GI: The spleen is palpable in the left mid abdomen. No hepatomegaly Vascular: No leg edema   Lab Results:  Lab Results  Component Value Date   WBC 7.8 07/11/2012   HGB 10.6* 07/11/2012   HCT 32.0* 07/11/2012   MCV 80.4 07/11/2012   PLT 378 07/11/2012   ANC 6.6    Medications: I have reviewed the patient's current medications.  Assessment/Plan: 1. Essential thrombocytosis. The platelet count is now in the normal range. Stable splenomegaly. 2. Microcytic anemia. Improved with the ferrous fumarate. The hemoglobin is stable.   Disposition:  She appears stable. No clinical or hematologic evidence of a progressive myeloproliferative disorder. It is possible the chronic mild malaise and exertional dyspnea are related to the mild anemia. No other symptoms related to the essential thrombocytosis.  Lynn Mccarthy will return for a CBC and ferritin level in 6 months. She is scheduled for a one-year a visit. She knows to contact us in the interim for new symptoms.   Thornton Papas, MD  07/11/2012  11:50 AM

## 2012-07-11 NOTE — Telephone Encounter (Signed)
, °

## 2012-07-20 ENCOUNTER — Ambulatory Visit
Admission: RE | Admit: 2012-07-20 | Discharge: 2012-07-20 | Disposition: A | Discharge status: Home / Self Care | Payer: BC Managed Care – PPO | Source: Ambulatory Visit | Attending: Family Medicine | Admitting: Family Medicine

## 2012-07-20 ENCOUNTER — Other Ambulatory Visit: Payer: Self-pay | Admitting: Family Medicine

## 2012-07-20 DIAGNOSIS — R05 Cough: Secondary | ICD-10-CM

## 2012-08-15 ENCOUNTER — Other Ambulatory Visit: Payer: BC Managed Care – PPO | Admitting: Lab

## 2012-08-15 ENCOUNTER — Ambulatory Visit: Payer: BC Managed Care – PPO | Admitting: Oncology

## 2012-09-13 ENCOUNTER — Other Ambulatory Visit: Payer: Self-pay | Admitting: Dermatology

## 2012-12-29 ENCOUNTER — Other Ambulatory Visit (HOSPITAL_BASED_OUTPATIENT_CLINIC_OR_DEPARTMENT_OTHER): Payer: BC Managed Care – PPO

## 2012-12-29 DIAGNOSIS — D649 Anemia, unspecified: Secondary | ICD-10-CM

## 2012-12-29 LAB — CBC WITH DIFFERENTIAL/PLATELET
Eosinophils Absolute: 0 10*3/uL (ref 0.0–0.5)
HCT: 32.7 % — ABNORMAL LOW (ref 34.8–46.6)
LYMPH%: 9.6 % — ABNORMAL LOW (ref 14.0–49.7)
MONO#: 0.3 10*3/uL (ref 0.1–0.9)
NEUT#: 5.8 10*3/uL (ref 1.5–6.5)
NEUT%: 83.9 % — ABNORMAL HIGH (ref 38.4–76.8)
Platelets: 342 10*3/uL (ref 145–400)
RBC: 4.04 10*6/uL (ref 3.70–5.45)
WBC: 6.9 10*3/uL (ref 3.9–10.3)

## 2012-12-30 LAB — FERRITIN CHCC: Ferritin: 166 ng/ml (ref 9–269)

## 2013-02-08 ENCOUNTER — Telehealth: Payer: Self-pay | Admitting: *Deleted

## 2013-02-08 ENCOUNTER — Telehealth: Payer: Self-pay | Admitting: Oncology

## 2013-02-08 DIAGNOSIS — D649 Anemia, unspecified: Secondary | ICD-10-CM

## 2013-02-08 NOTE — Telephone Encounter (Signed)
Message copied by Brien Few on Wed Feb 08, 2013  1:23 PM ------      Message from: Betsy Coder B      Created: Wed Feb 08, 2013 11:47 AM       Please call patient, check cbc/diff and morphology review, 1 blast seen on Dec. Cbc- likely nothing to worry about, check cbc to be sure not increasing ------

## 2013-02-08 NOTE — Telephone Encounter (Signed)
lmonvm for pt re appt for 1/29 @ 9:15am. pt asked to call to r/s if she cannot keep appt. other appts remain the same

## 2013-02-09 ENCOUNTER — Other Ambulatory Visit: Payer: BC Managed Care – PPO

## 2013-02-10 ENCOUNTER — Telehealth: Payer: Self-pay | Admitting: Oncology

## 2013-02-10 ENCOUNTER — Other Ambulatory Visit: Payer: Self-pay | Admitting: *Deleted

## 2013-02-10 NOTE — Telephone Encounter (Signed)
Gave pt appt for lab on 02/13/13

## 2013-02-10 NOTE — Telephone Encounter (Signed)
Left VM to call office.

## 2013-02-10 NOTE — Telephone Encounter (Signed)
Message copied by Tania Ade on Fri Feb 10, 2013  2:39 PM ------      Message from: Betsy Coder B      Created: Wed Feb 08, 2013 11:47 AM       Please call patient, check cbc/diff and morphology review, 1 blast seen on Dec. Cbc- likely nothing to worry about, check cbc to be sure not increasing ------

## 2013-02-13 ENCOUNTER — Other Ambulatory Visit: Payer: BC Managed Care – PPO

## 2013-02-14 ENCOUNTER — Telehealth: Payer: Self-pay | Admitting: *Deleted

## 2013-02-14 NOTE — Telephone Encounter (Signed)
Message from pt reporting she did not get the voicemails about repeating labs in time. Requested I call back, leave message as to why she needs lab so soon. Left message explaining there were abnormal cells in the last CBC. Dr. Benay Spice wanted to recheck lab. Requested she call office to reschedule.

## 2013-02-14 NOTE — Telephone Encounter (Signed)
Pt returned call, explained to her there was an immature blood cell noted on last CBC. Dr. Benay Spice wants to repeat lab and review blood smear. She voiced understanding. Requests appt for 2/4 at 3:15. Orders entered.

## 2013-02-15 ENCOUNTER — Other Ambulatory Visit (HOSPITAL_BASED_OUTPATIENT_CLINIC_OR_DEPARTMENT_OTHER): Payer: BC Managed Care – PPO

## 2013-02-15 DIAGNOSIS — D649 Anemia, unspecified: Secondary | ICD-10-CM

## 2013-02-15 LAB — CBC WITH DIFFERENTIAL/PLATELET
BASO%: 0.9 % (ref 0.0–2.0)
BASOS ABS: 0.1 10*3/uL (ref 0.0–0.1)
EOS ABS: 0.1 10*3/uL (ref 0.0–0.5)
EOS%: 0.7 % (ref 0.0–7.0)
HEMATOCRIT: 35.5 % (ref 34.8–46.6)
HEMOGLOBIN: 11.2 g/dL — AB (ref 11.6–15.9)
LYMPH#: 0.9 10*3/uL (ref 0.9–3.3)
LYMPH%: 9 % — AB (ref 14.0–49.7)
MCH: 25.4 pg (ref 25.1–34.0)
MCHC: 31.4 g/dL — ABNORMAL LOW (ref 31.5–36.0)
MCV: 80.7 fL (ref 79.5–101.0)
MONO#: 0.5 10*3/uL (ref 0.1–0.9)
MONO%: 5.7 % (ref 0.0–14.0)
NEUT#: 8.1 10*3/uL — ABNORMAL HIGH (ref 1.5–6.5)
NEUT%: 83.7 % — ABNORMAL HIGH (ref 38.4–76.8)
Platelets: 436 10*3/uL — ABNORMAL HIGH (ref 145–400)
RBC: 4.4 10*6/uL (ref 3.70–5.45)
RDW: 18.8 % — ABNORMAL HIGH (ref 11.2–14.5)
WBC: 9.6 10*3/uL (ref 3.9–10.3)

## 2013-02-15 LAB — MORPHOLOGY: PLT EST: INCREASED

## 2013-02-16 ENCOUNTER — Telehealth: Payer: Self-pay | Admitting: *Deleted

## 2013-02-16 NOTE — Telephone Encounter (Signed)
VM requesting lab results from 02/15/13. MD notified of request.

## 2013-02-17 ENCOUNTER — Telehealth: Payer: Self-pay | Admitting: *Deleted

## 2013-02-17 NOTE — Telephone Encounter (Signed)
Message copied by Tania Ade on Fri Feb 17, 2013  8:47 AM ------      Message from: Betsy Coder B      Created: Thu Feb 16, 2013  4:38 PM       The hemoglobin is stable. I reviewed the peripheral blood smear from 02/15/2013. No blasts are seen. There are myelocytes. Giant platelets. Ovalocytes, teardrops, and a rare nucleated red cell. ------

## 2013-02-17 NOTE — Telephone Encounter (Signed)
Called patient with CBC/smear results. Explained what various types of cells MD observed are and how in time thrombocytosis can progress. Stressed that this may never happen, and has not at this time. No need to be concerned. Follow up as scheduled.   Tania Ade, RN

## 2013-02-17 NOTE — Telephone Encounter (Signed)
Asking for reliable website that Dr. Benay Spice suggests she use to gain more information on thrombocytosis and myeloproliferative disorder.

## 2013-02-20 NOTE — Telephone Encounter (Signed)
Left VM for patient that Dr. Benay Spice recommends [employer] site.

## 2013-04-30 ENCOUNTER — Ambulatory Visit (INDEPENDENT_AMBULATORY_CARE_PROVIDER_SITE_OTHER): Payer: BC Managed Care – PPO | Admitting: Internal Medicine

## 2013-04-30 ENCOUNTER — Ambulatory Visit: Payer: BC Managed Care – PPO

## 2013-04-30 VITALS — BP 120/80 | HR 79 | Temp 98.7°F | Resp 16 | Ht 65.5 in | Wt 150.0 lb

## 2013-04-30 DIAGNOSIS — S8001XA Contusion of right knee, initial encounter: Secondary | ICD-10-CM

## 2013-04-30 DIAGNOSIS — M25561 Pain in right knee: Secondary | ICD-10-CM

## 2013-04-30 DIAGNOSIS — M25569 Pain in unspecified knee: Secondary | ICD-10-CM

## 2013-04-30 DIAGNOSIS — S8000XA Contusion of unspecified knee, initial encounter: Secondary | ICD-10-CM

## 2013-04-30 NOTE — Progress Notes (Signed)
   Subjective:    Patient ID: Lynn Mccarthy, female    DOB: 01-02-1971, 43 y.o.   MRN: [id number]  HPI Chief Complaint  Patient presents with  . Knee Injury    right knee    This chart was scribed for Tami Lin, MD by Thea Alken, ED Scribe. This patient was seen in room 1 and the patient's care was started at 4:23 PM.  HPI Comments: Lynn Mccarthy is a 43 y.o. female who presents to the [facility] complaining of right knee injury onset 2 hours ago. Pt reports that she was at the skating rink, skating without knee pads, when she fell directly on right knee . Pt reports being able to bear minor weight to left leg. Pt reports that she can bend her knee slightly.    Past Medical History  Diagnosis Date  . History of chicken pox   . Yeast infection   . HPV (human papilloma virus) infection   . Bacterial infection   . Vulvar inflammation   . Thrombocytosis   . Ovarian cyst   . Candida vaginitis   . Fibrocystic breast   . Basal cell carcinoma of skin 06/2011   Allergies  Allergen Reactions  . Doxycycline Nausea Only    Stomach cramps  . Paxil [Paroxetine Hcl] Other (See Comments)    Made her dazed   Prior to Admission medications   Medication Sig Start Date End Date Taking? Authorizing Provider  Ferrous Fumarate (HEMOCYTE PO) Take 1 tablet by mouth daily.   Yes Historical Provider, MD  gabapentin (NEURONTIN) 600 MG tablet Take 600 mg by mouth as needed.  08/01/11  Yes Historical Provider, MD  Multiple Vitamin (MULTIVITAMIN) tablet Take 1 tablet by mouth daily.   Yes Historical Provider, MD  sertraline (ZOLOFT) 100 MG tablet Take 50 mg by mouth daily.  08/12/11  Yes Historical Provider, MD    Review of Systems  Musculoskeletal: Positive for arthralgias and gait problem.       Objective:   Physical Exam  Nursing note and vitals reviewed. Constitutional: She is oriented to person, place, and time. She appears well-developed and  well-nourished. No distress.  HENT:  Head: Normocephalic and atraumatic.  Eyes: EOM are normal.  Neck: Neck supple.  Cardiovascular: Normal rate.   Pulmonary/Chest: Effort normal. No respiratory distress.  Musculoskeletal: Normal range of motion.  Right knee abrasion over patellar with swelling and extreme tenderness with any motion or patellar ballottement  Nontender over the joint line and no pain with palpation over the mediolateral collateral ligaments Valgus and varus stressors show no laxity or pain Lachmann's is stable  Neurological: She is alert and oriented to person, place, and time.  Skin: Skin is warm and dry.  Psychiatric: She has a normal mood and affect. Her behavior is normal.     [facility] reading (PRIMARY) by  Dr.Shaquina Gillham= no fracture or dislocation of the patellae. There is spurring at the proximal fibula but no fracture. The knee joint appears normal    Assessment & Plan:  I have completed the patient encounter in its entirety as documented by the scribe, with editing by me where necessary. Myonna Chisom P. Laney Pastor, M.D. Pain knee with swelling secondary to contusion patella  Ice frequently next 24 hours-48hrs Ibuprofen Compressive Ace wrap but not to the point of discomfort Advance weightbearing only as tolerated(crutches) Recheck if not walking in 3 days

## 2013-07-10 ENCOUNTER — Telehealth: Payer: Self-pay | Admitting: Oncology

## 2013-07-10 ENCOUNTER — Other Ambulatory Visit (HOSPITAL_BASED_OUTPATIENT_CLINIC_OR_DEPARTMENT_OTHER): Payer: BC Managed Care – PPO

## 2013-07-10 ENCOUNTER — Ambulatory Visit (HOSPITAL_BASED_OUTPATIENT_CLINIC_OR_DEPARTMENT_OTHER): Payer: BC Managed Care – PPO | Admitting: Oncology

## 2013-07-10 VITALS — BP 120/81 | HR 81 | Temp 98.5°F | Resp 20 | Ht 65.5 in | Wt 161.5 lb

## 2013-07-10 DIAGNOSIS — D509 Iron deficiency anemia, unspecified: Secondary | ICD-10-CM

## 2013-07-10 DIAGNOSIS — D649 Anemia, unspecified: Secondary | ICD-10-CM

## 2013-07-10 DIAGNOSIS — Z139 Encounter for screening, unspecified: Secondary | ICD-10-CM

## 2013-07-10 DIAGNOSIS — R5381 Other malaise: Secondary | ICD-10-CM

## 2013-07-10 DIAGNOSIS — D473 Essential (hemorrhagic) thrombocythemia: Secondary | ICD-10-CM

## 2013-07-10 DIAGNOSIS — R635 Abnormal weight gain: Secondary | ICD-10-CM

## 2013-07-10 DIAGNOSIS — R5383 Other fatigue: Secondary | ICD-10-CM

## 2013-07-10 LAB — CBC WITH DIFFERENTIAL/PLATELET
BASO%: 0.5 % (ref 0.0–2.0)
BASOS ABS: 0 10*3/uL (ref 0.0–0.1)
EOS%: 1.2 % (ref 0.0–7.0)
Eosinophils Absolute: 0.1 10*3/uL (ref 0.0–0.5)
HEMATOCRIT: 35.3 % (ref 34.8–46.6)
HEMOGLOBIN: 10.9 g/dL — AB (ref 11.6–15.9)
LYMPH#: 0.9 10*3/uL (ref 0.9–3.3)
LYMPH%: 10.5 % — AB (ref 14.0–49.7)
MCH: 26 pg (ref 25.1–34.0)
MCHC: 30.9 g/dL — ABNORMAL LOW (ref 31.5–36.0)
MCV: 84 fL (ref 79.5–101.0)
MONO#: 0.4 10*3/uL (ref 0.1–0.9)
MONO%: 4.6 % (ref 0.0–14.0)
NEUT#: 7 10*3/uL — ABNORMAL HIGH (ref 1.5–6.5)
NEUT%: 83.2 % — AB (ref 38.4–76.8)
PLATELETS: 355 10*3/uL (ref 145–400)
RBC: 4.2 10*6/uL (ref 3.70–5.45)
RDW: 17.8 % — ABNORMAL HIGH (ref 11.2–14.5)
WBC: 8.4 10*3/uL (ref 3.9–10.3)

## 2013-07-10 LAB — TSH CHCC: TSH: 1.041 m(IU)/L (ref 0.308–3.960)

## 2013-07-10 LAB — FERRITIN CHCC: Ferritin: 175 ng/ml (ref 9–269)

## 2013-07-10 NOTE — Progress Notes (Signed)
Only fall has been while roller skating- Asking MD to add more lab work due to weight gain and fatigue-PCP has not worked this up.

## 2013-07-10 NOTE — Telephone Encounter (Signed)
gv adn printed aptp sched and avs fo rpt for June 2016

## 2013-07-10 NOTE — Progress Notes (Signed)
  [facility] OFFICE PROGRESS NOTE   Diagnosis: Essential thrombocytosis  INTERVAL HISTORY:   She returns as scheduled. She is taking iron. The palpable splenomegaly has not changed. No significant abdominal pain or nausea. She is concerned about malaise and weight gain. She requests we check her thyroid function and vitamin D level.  Objective:  Vital signs in last 24 hours:  Blood pressure 120/81, pulse 81, temperature 98.5 F (36.9 C), temperature source Oral, resp. rate 20, height 5' 5.5" (1.664 m), weight 161 lb 8 oz (73.256 kg).   Resp: Lungs clear bilaterally Cardio: Regular rate and rhythm GI: No hepatomegaly. The spleen tip is palpable in the left abdomen extending to the midline and to just above the umbilicus. No tenderness. Vascular: No leg edema   Lab Results:  Lab Results  Component Value Date   WBC 8.4 07/10/2013   HGB 10.9* 07/10/2013   HCT 35.3 07/10/2013   MCV 84.0 07/10/2013   PLT 355 07/10/2013   NEUTROABS 7.0* 07/10/2013   Medications: I have reviewed the patient's current medications.  Assessment/Plan: 1. Essential thrombocytosis. The platelet count is in the normal range. Stable splenomegaly. 2. Microcytic anemia. Improved with the ferrous fumarate. The hemoglobin is stable.  Disposition:  She is stable from a hematologic standpoint. She will continue iron. Ms.Deyarmin will return for an office visit and CBC in one year. She will contact us in the interim as needed. We checked a TSH and vitamin D level today. We will forward these results to Dr.  Nancy Fetter. Betsy Coder, MD  07/10/2013  1:35 PM

## 2013-07-11 LAB — VITAMIN D 25 HYDROXY (VIT D DEFICIENCY, FRACTURES): VIT D 25 HYDROXY: 43 ng/mL (ref 30–89)

## 2013-07-12 ENCOUNTER — Telehealth: Payer: Self-pay | Admitting: Medical Oncology

## 2013-07-12 ENCOUNTER — Telehealth: Payer: Self-pay | Admitting: *Deleted

## 2013-07-12 NOTE — Telephone Encounter (Signed)
Pt notified. Asking for TSH- I told her it was not reported to me . She asked to call her with result. I faxed vit d to PCP

## 2013-07-12 NOTE — Telephone Encounter (Signed)
Called pt with TSH results. Informed her this was sent to PCP as well.

## 2013-07-12 NOTE — Telephone Encounter (Signed)
Message copied by Ardeen Garland on Wed Jul 12, 2013 10:04 AM ------      Message from: Brien Few      Created: Wed Jul 12, 2013  9:50 AM                   ----- Message -----         From: Ladell Pier, MD         Sent: 07/11/2013   7:25 AM           To: Tania Ade, RN, Ludwig Lean, RN, #            Please call patient, Vit. D is normal, send Vit. D and TSH to primary MD ------

## 2013-08-22 ENCOUNTER — Encounter: Payer: Self-pay | Admitting: Sports Medicine

## 2013-08-22 ENCOUNTER — Ambulatory Visit (INDEPENDENT_AMBULATORY_CARE_PROVIDER_SITE_OTHER): Payer: BC Managed Care – PPO | Admitting: Sports Medicine

## 2013-08-22 VITALS — BP 114/71 | Ht 65.5 in | Wt 158.0 lb

## 2013-08-22 DIAGNOSIS — S43109A Unspecified dislocation of unspecified acromioclavicular joint, initial encounter: Secondary | ICD-10-CM | POA: Insufficient documentation

## 2013-08-22 DIAGNOSIS — S43102A Unspecified dislocation of left acromioclavicular joint, initial encounter: Secondary | ICD-10-CM

## 2013-08-22 DIAGNOSIS — G8929 Other chronic pain: Secondary | ICD-10-CM | POA: Diagnosis not present

## 2013-08-22 DIAGNOSIS — M25519 Pain in unspecified shoulder: Secondary | ICD-10-CM

## 2013-08-22 DIAGNOSIS — M25512 Pain in left shoulder: Principal | ICD-10-CM

## 2013-08-22 MED ORDER — AMITRIPTYLINE HCL 25 MG PO TABS: 25.0000 mg | ORAL_TABLET | Freq: Every day | ORAL | Status: DC

## 2013-08-22 NOTE — Assessment & Plan Note (Signed)
This is chronic and I think allows her to impinge her rotator cuff tendons  We will see if we can establish better muscular control to lessen the impingement

## 2013-08-22 NOTE — Progress Notes (Signed)
  Lynn Mccarthy - 43 y.o. female MRN [id number]  Date of birth: Feb 10, 1970  SUBJECTIVE:  Including CC & ROS.   Persistent left shoulder pain   HISTORY: Past Medical, Surgical, Social, and Family History Reviewed & Updated per EMR.   Pertinent Historical Findings include: Patient with a history of a fall onto her left shoulder in 2009 falling to the floor. She had immediate pain and was evaluated with a normal x-ray. She was seen at [place] and when she did not respond to a standard course of physical therapy underwent an MRI. The MRI showed injury to the coracoacromial ligament and a supraspinatous tendinopathy.  And not any better in 2000. She underwent an arthroscopy by Dr. Theda Sers. No other significant injuries were found only arthroscopy.  She bought a total gym and continue doing yoga and other exercises with some gradual improvement but never complete resolution of pain. Over the past few months the pain has been worse and sometimes bothers her at night. She feels that it catches at times.  DATA REVIEWED: MRI report  PHYSICAL EXAM:  VS: BP:114/71 mmHg  HR: bpm  TEMP: ( )  RESP:   HT:5' 5.5" (166.4 cm)   WT:158 lb (71.668 kg)  BMI:25.9 PHYSICAL EXAM: No acute distress  Shoulder: Inspection reveals mild atrophy and asymmetry at left post shoulder Palpation is normal with no tenderness over AC joint or bicipital groove. ROM is full in all planes. There is laxity on testing the left shoulder in inferior and AP glide  Rotator cuff strength normal throughout. + signs of impingement with Neer and Hawkin's tests, empty can. Speeds and Yergason's tests normal. No labral pathology noted with negative Obrien's, negative clunk and good stability. Normal scapular function observed. No painful arc and no drop arm sign. No apprehension sign  Left shoulder ultrasound Bicipital  tendon is normal Infraspinatus, supraspinatous teres minor and some scapularis tendons  are all normal A.c. joint is abnormally positioned on the left with widening and anterior displacement of the clavicle This is not present on the right  ASSESSMENT & PLAN: See problem based charting & AVS for pt instructions.  AC joint instability w secondary impingement

## 2013-08-22 NOTE — Assessment & Plan Note (Signed)
She will use symptomatic medications as needed  Start with home rehabilitation to emphasize scapular stabilization and a few maintenance rotator cuff exercises  Avoid vulnerable positions in yoga  Recheck in 6 weeks

## 2013-08-22 NOTE — Patient Instructions (Addendum)
We suspect your pain is related to increased AC joint space, which likely started with damage to the ligaments at the time of your fall in 2009  Exercises as discussed and as in handout to strengthen the muscles and help stabilize your shoulder/scapula  Amitriptyline at night to help with pain, muscle spasms  Return in about 6 weeks (around 10/03/2013).

## 2013-09-04 ENCOUNTER — Other Ambulatory Visit: Payer: Self-pay | Admitting: Obstetrics and Gynecology

## 2013-09-04 DIAGNOSIS — Z1231 Encounter for screening mammogram for malignant neoplasm of breast: Secondary | ICD-10-CM

## 2013-09-08 ENCOUNTER — Ambulatory Visit
Admission: RE | Admit: 2013-09-08 | Discharge: 2013-09-08 | Disposition: A | Discharge status: Home / Self Care | Payer: BC Managed Care – PPO | Source: Ambulatory Visit | Attending: Obstetrics and Gynecology | Admitting: Obstetrics and Gynecology

## 2013-09-08 ENCOUNTER — Encounter (INDEPENDENT_AMBULATORY_CARE_PROVIDER_SITE_OTHER): Payer: Self-pay

## 2013-09-08 DIAGNOSIS — Z1231 Encounter for screening mammogram for malignant neoplasm of breast: Secondary | ICD-10-CM

## 2013-09-19 ENCOUNTER — Ambulatory Visit: Payer: BC Managed Care – PPO | Admitting: Sports Medicine

## 2013-09-22 ENCOUNTER — Telehealth: Payer: Self-pay | Admitting: *Deleted

## 2013-09-22 NOTE — Telephone Encounter (Signed)
Reports pain has improved, rates 2/10 now. Seems to have moved down to behind the knee. Denies edema or redness. Reviewed with Dr. Benay Spice: See PCP, may need ultrasound of leg. Pt voiced understanding.

## 2013-09-22 NOTE — Telephone Encounter (Addendum)
Message from pt, PCP recommends she go to ED. Pt does not want to go to ED. Asks if there is any other way to have Doppler without being seen in ED? Reviewed with Jenny Reichmann, NP: Bring pt to office now, will evaluate and order Doppler if necessary. Returned call to pt, PCP is ordering Doppler to be done at [facility]. She appreciates call back.

## 2013-10-03 ENCOUNTER — Encounter: Payer: Self-pay | Admitting: Sports Medicine

## 2013-10-03 ENCOUNTER — Ambulatory Visit (INDEPENDENT_AMBULATORY_CARE_PROVIDER_SITE_OTHER): Payer: BC Managed Care – PPO | Admitting: Sports Medicine

## 2013-10-03 VITALS — BP 117/71 | Ht 65.5 in | Wt 152.0 lb

## 2013-10-03 DIAGNOSIS — Z5189 Encounter for other specified aftercare: Secondary | ICD-10-CM

## 2013-10-03 DIAGNOSIS — S43102D Unspecified dislocation of left acromioclavicular joint, subsequent encounter: Secondary | ICD-10-CM

## 2013-10-03 DIAGNOSIS — S43109A Unspecified dislocation of unspecified acromioclavicular joint, initial encounter: Secondary | ICD-10-CM

## 2013-10-03 NOTE — Patient Instructions (Signed)
You have an unstable AC joint on the left  Let's try 3 sets of 15:  Shrugs Forward flexion with elbow straight Limited fly for pecs  Concentrate on posture for upper chest and shoulder  Try amitriptyline for 1 month straight

## 2013-10-03 NOTE — Progress Notes (Signed)
CC: Follow up left shoulder pain  HPI: 73 yof presenting today for follow up of left shoulder pain.  She had previously been seen about one month ago and diagnosed with [facility] joint separation and instability on the left.  She had been assigned home exercises and elavil 25 mg qhs.  She had been taking the elavil as needed. Patient continues to do yoga with modifications. Some overhead activities tend to be more bothersome to her. Doesn't feel as if overall she has improved much, still feels unstable at times in her left shoulder, as if she has to hold it in place. Some neck tightness at times.  She tried a series of RC exercises but these did not seem to help   Objective: BP 117/71  Ht 5' 5.5" (1.664 m)  Wt 152 lb (68.947 kg)  BMI 24.90 kg/m2  LMP 08/24/2013 Gen: NAD HEENT: ncat CV: nl perfusion Resp: nonlabored resp Ext:  Msk -  Left shoulder No obvious deformity +trapezius muscle tightness on left.   Palpable movement of AC jt on left with shoulder roll. FROM forward flexion and abduction, FROM internal/external rotation. Strength 5/5 throughout RTC muscles. Some discomfort with crossbody examination, some discomfort with hawkins test when approaching midline. Negative Neers NV intact  A/P:  82 yof presenting for follow up of left shoulder pain, AC joint separation and instability. Please see AVS for patient instructions - encouraged to perform home exercises.  Encouraged to take elavil 25 mg QHS consistently.  Proper posture reviewed and advised.  Routine follow up in 6 weeks.  Note written by Malka So, MD.  Patient seen and examined with Dr. Oneida Alar.  Agree with assessment/  Ila Mcgill, MD

## 2013-10-03 NOTE — Assessment & Plan Note (Signed)
We will try to use different series of MM for rehab and see if that gives her more stability  Nothing on eval seemed to suggest surgical intervention and her mobility makes the approach to HEP tricky  Reck 2 mos

## 2013-11-13 ENCOUNTER — Encounter: Payer: Self-pay | Admitting: Sports Medicine

## 2014-01-11 ENCOUNTER — Ambulatory Visit (INDEPENDENT_AMBULATORY_CARE_PROVIDER_SITE_OTHER): Payer: BC Managed Care – PPO | Admitting: Family Medicine

## 2014-01-11 VITALS — BP 118/72 | HR 81 | Temp 98.0°F | Resp 18 | Ht 65.5 in | Wt 154.6 lb

## 2014-01-11 DIAGNOSIS — T7840XA Allergy, unspecified, initial encounter: Secondary | ICD-10-CM

## 2014-01-11 DIAGNOSIS — T50995A Adverse effect of other drugs, medicaments and biological substances, initial encounter: Secondary | ICD-10-CM

## 2014-01-11 DIAGNOSIS — L270 Generalized skin eruption due to drugs and medicaments taken internally: Secondary | ICD-10-CM

## 2014-01-11 MED ORDER — METHYLPREDNISOLONE SODIUM SUCC 125 MG IJ SOLR
125.0000 mg | Freq: Once | INTRAMUSCULAR | Status: AC
  Administered 2014-01-11: 125 mg via INTRAMUSCULAR

## 2014-01-11 MED ORDER — PREDNISONE 20 MG PO TABS: ORAL_TABLET | ORAL | Status: DC

## 2014-01-11 NOTE — Progress Notes (Signed)
Subjective: Patient was itching and burning on her face yesterday. Today's much worse with puffiness around the eyes and rash on the face and the of the neck. She had used a toner that she had first tried several weeks ago. Knows of no other allergens. Did not eat anything differently. Was scheduled to go out of town early this morning.  Objective: Face is red and puffy looking. The eyes are blanched around the orbit and puffy eyelids. Throat clear. Neck supple without nodes. Chest clear. Heart regular without murmurs.  Assessment: Allergic reaction on face, not really and angioedema however. Probably allergic to toner  Solu-Medrol Prednisone Allegra or Claritin Ranitidine Return if problems

## 2014-01-11 NOTE — Patient Instructions (Signed)
Wash face well  Take prednisone beginning tomorrow morning 2 pills daily for 2 days, then 1 daily for 2 days  Take over-the-counter Allegra or Claritin one daily for about 5 days  Take over-the-counter ranitidine (Zantac) 150 mg twice daily for about 5 days  In the event of any worse reaction go to an emergency room or urgent care

## 2014-01-29 ENCOUNTER — Other Ambulatory Visit: Payer: Self-pay | Admitting: Obstetrics and Gynecology

## 2014-01-29 DIAGNOSIS — N6012 Diffuse cystic mastopathy of left breast: Secondary | ICD-10-CM

## 2014-01-29 DIAGNOSIS — N63 Unspecified lump in unspecified breast: Secondary | ICD-10-CM

## 2014-01-29 DIAGNOSIS — N644 Mastodynia: Secondary | ICD-10-CM

## 2014-02-05 ENCOUNTER — Ambulatory Visit
Admission: RE | Admit: 2014-02-05 | Discharge: 2014-02-05 | Disposition: A | Discharge status: Home / Self Care | Payer: BC Managed Care – PPO | Source: Ambulatory Visit | Attending: Obstetrics and Gynecology | Admitting: Obstetrics and Gynecology

## 2014-02-05 DIAGNOSIS — N63 Unspecified lump in unspecified breast: Secondary | ICD-10-CM

## 2014-02-05 DIAGNOSIS — N6012 Diffuse cystic mastopathy of left breast: Secondary | ICD-10-CM

## 2014-02-05 DIAGNOSIS — N644 Mastodynia: Secondary | ICD-10-CM

## 2014-02-06 ENCOUNTER — Other Ambulatory Visit: Payer: BC Managed Care – PPO

## 2014-02-27 ENCOUNTER — Ambulatory Visit
Admission: RE | Admit: 2014-02-27 | Discharge: 2014-02-27 | Disposition: A | Discharge status: Home / Self Care | Payer: BC Managed Care – PPO | Source: Ambulatory Visit | Attending: Family Medicine | Admitting: Family Medicine

## 2014-02-27 ENCOUNTER — Other Ambulatory Visit: Payer: Self-pay | Admitting: Family Medicine

## 2014-02-27 DIAGNOSIS — R102 Pelvic and perineal pain: Secondary | ICD-10-CM

## 2014-07-09 ENCOUNTER — Ambulatory Visit (HOSPITAL_BASED_OUTPATIENT_CLINIC_OR_DEPARTMENT_OTHER): Payer: BC Managed Care – PPO | Admitting: Oncology

## 2014-07-09 ENCOUNTER — Other Ambulatory Visit (HOSPITAL_BASED_OUTPATIENT_CLINIC_OR_DEPARTMENT_OTHER): Payer: BC Managed Care – PPO

## 2014-07-09 ENCOUNTER — Telehealth: Payer: Self-pay | Admitting: Oncology

## 2014-07-09 ENCOUNTER — Other Ambulatory Visit: Payer: BC Managed Care – PPO

## 2014-07-09 VITALS — BP 118/71 | HR 84 | Temp 98.6°F | Resp 19 | Ht 65.0 in | Wt 162.2 lb

## 2014-07-09 DIAGNOSIS — D509 Iron deficiency anemia, unspecified: Secondary | ICD-10-CM

## 2014-07-09 DIAGNOSIS — D75839 Thrombocytosis, unspecified: Secondary | ICD-10-CM

## 2014-07-09 DIAGNOSIS — D473 Essential (hemorrhagic) thrombocythemia: Secondary | ICD-10-CM

## 2014-07-09 LAB — CBC WITH DIFFERENTIAL/PLATELET
BASO%: 1 % (ref 0.0–2.0)
BASOS ABS: 0.1 10*3/uL (ref 0.0–0.1)
EOS%: 0.8 % (ref 0.0–7.0)
Eosinophils Absolute: 0.1 10*3/uL (ref 0.0–0.5)
HCT: 37.3 % (ref 34.8–46.6)
HEMOGLOBIN: 11.7 g/dL (ref 11.6–15.9)
LYMPH%: 6.5 % — ABNORMAL LOW (ref 14.0–49.7)
MCH: 25.4 pg (ref 25.1–34.0)
MCHC: 31.4 g/dL — ABNORMAL LOW (ref 31.5–36.0)
MCV: 80.8 fL (ref 79.5–101.0)
MONO#: 0.5 10*3/uL (ref 0.1–0.9)
MONO%: 3.5 % (ref 0.0–14.0)
NEUT%: 88.2 % — ABNORMAL HIGH (ref 38.4–76.8)
NEUTROS ABS: 13.4 10*3/uL — AB (ref 1.5–6.5)
PLATELETS: 391 10*3/uL (ref 145–400)
RBC: 4.62 10*6/uL (ref 3.70–5.45)
RDW: 18.9 % — AB (ref 11.2–14.5)
WBC: 15.2 10*3/uL — AB (ref 3.9–10.3)
lymph#: 1 10*3/uL (ref 0.9–3.3)

## 2014-07-09 LAB — TECHNOLOGIST REVIEW

## 2014-07-09 NOTE — Telephone Encounter (Signed)
Gave and printed appt sched and avs for pt for SEpt and June 2017

## 2014-07-09 NOTE — Progress Notes (Signed)
  [place] OFFICE PROGRESS NOTE   Diagnosis: Essential thrombocytosis  INTERVAL HISTORY:   Lynn Mccarthy returns as scheduled. She reports an upper respiratory infection over the past several weeks. She was treated with a course of antibodies by Dr.Sun with initial improvement. No fever. She reports a sore throat and cough. The cough and congestion have persisted. No bleeding or symptom of thrombosis.  Objective:  Vital signs in last 24 hours:  Blood pressure 118/71, pulse 84, temperature 98.6 F (37 C), temperature source Oral, resp. rate 19, height 5\' 5"  (1.651 m), weight 162 lb 3.2 oz (73.573 kg), SpO2 100 %.    HEENT: Oropharynx without erythema or exudate Resp: Lungs clear bilaterally in the anterior and posterior lung fields, no respiratory distress Cardio: Regular rate and rhythm GI: No hepatomegaly, the spleen is palpable in the left abdomen Vascular: No leg edema   Lab Results:  Lab Results  Component Value Date   WBC 15.2* 07/09/2014   HGB 11.7 07/09/2014   HCT 37.3 07/09/2014   MCV 80.8 07/09/2014   PLT 391 07/09/2014   NEUTROABS 13.4* 07/09/2014    Medications: I have reviewed the patient's current medications.  Assessment/Plan: 1. Essential thrombocytosis. The platelet count is in the normal range. Stable splenomegaly. 2. Microcytic anemia. Improved with iron therapy    Disposition:  She is stable from a hematologic standpoint other than the mild neutrophilia. This may be related to the myeloproliferative disorder or the current upper respiratory infection. She will continue iron. Lynn Mccarthy will return for a CBC in 3 months and an office visit in one year. She will follow-up with Dr.Sun if the respiratory symptoms do not resolve.  Betsy Coder, MD  07/09/2014  11:21 AM

## 2014-07-19 ENCOUNTER — Ambulatory Visit: Payer: BC Managed Care – PPO | Admitting: Sports Medicine

## 2014-09-27 ENCOUNTER — Telehealth: Payer: Self-pay | Admitting: Oncology

## 2014-09-27 NOTE — Telephone Encounter (Signed)
Pt lft msg concerning labs, s/w pt confirming labs for 09/19 no MD until June 2017.... KJ

## 2014-10-01 ENCOUNTER — Other Ambulatory Visit (HOSPITAL_BASED_OUTPATIENT_CLINIC_OR_DEPARTMENT_OTHER): Payer: BC Managed Care – PPO

## 2014-10-01 DIAGNOSIS — D509 Iron deficiency anemia, unspecified: Secondary | ICD-10-CM

## 2014-10-01 DIAGNOSIS — D473 Essential (hemorrhagic) thrombocythemia: Secondary | ICD-10-CM | POA: Diagnosis not present

## 2014-10-01 DIAGNOSIS — D75839 Thrombocytosis, unspecified: Secondary | ICD-10-CM

## 2014-10-01 LAB — CBC WITH DIFFERENTIAL/PLATELET
BASO%: 0.4 % (ref 0.0–2.0)
Basophils Absolute: 0 10*3/uL (ref 0.0–0.1)
EOS%: 0.8 % (ref 0.0–7.0)
Eosinophils Absolute: 0.1 10*3/uL (ref 0.0–0.5)
HCT: 34.1 % — ABNORMAL LOW (ref 34.8–46.6)
HGB: 10.5 g/dL — ABNORMAL LOW (ref 11.6–15.9)
LYMPH%: 8.3 % — ABNORMAL LOW (ref 14.0–49.7)
MCH: 25.9 pg (ref 25.1–34.0)
MCHC: 30.8 g/dL — AB (ref 31.5–36.0)
MCV: 84.2 fL (ref 79.5–101.0)
MONO#: 0.5 10*3/uL (ref 0.1–0.9)
MONO%: 4 % (ref 0.0–14.0)
NEUT%: 86.5 % — ABNORMAL HIGH (ref 38.4–76.8)
NEUTROS ABS: 9.7 10*3/uL — AB (ref 1.5–6.5)
NRBC: 1 % — AB (ref 0–0)
Platelets: 364 10*3/uL (ref 145–400)
RBC: 4.05 10*6/uL (ref 3.70–5.45)
RDW: 17.3 % — AB (ref 11.2–14.5)
WBC: 11.2 10*3/uL — AB (ref 3.9–10.3)
lymph#: 0.9 10*3/uL (ref 0.9–3.3)

## 2014-10-01 LAB — TECHNOLOGIST REVIEW

## 2014-10-03 ENCOUNTER — Telehealth: Payer: Self-pay | Admitting: Oncology

## 2014-10-03 ENCOUNTER — Telehealth: Payer: Self-pay | Admitting: *Deleted

## 2014-10-03 DIAGNOSIS — D473 Essential (hemorrhagic) thrombocythemia: Secondary | ICD-10-CM

## 2014-10-03 NOTE — Telephone Encounter (Signed)
Left message to confirm appointment for January

## 2014-10-03 NOTE — Telephone Encounter (Signed)
-----   Message from Ladell Pier, MD sent at 10/01/2014  7:35 PM EDT ----- Please call patient, hb is wbcs mildly elevated-likely due to the myeloproliferative disorder, check cbc 4 months

## 2014-10-03 NOTE — Telephone Encounter (Signed)
Left message on voicemail informing pt of upcoming lab appointment. WBCs mildly elevated. Dr. Benay Spice recommends checking CBC in 4 mos. Order to schedulers for appointment.

## 2015-01-29 ENCOUNTER — Telehealth: Payer: Self-pay | Admitting: Oncology

## 2015-01-29 NOTE — Telephone Encounter (Signed)
pt cld left voicemail wanting to know what appt was on 1/18-cld & spoke to pt  and adv pt that lab appt and gave pt time

## 2015-01-30 ENCOUNTER — Other Ambulatory Visit (HOSPITAL_BASED_OUTPATIENT_CLINIC_OR_DEPARTMENT_OTHER): Payer: BC Managed Care – PPO

## 2015-01-30 ENCOUNTER — Other Ambulatory Visit: Payer: Self-pay | Admitting: *Deleted

## 2015-01-30 DIAGNOSIS — D473 Essential (hemorrhagic) thrombocythemia: Secondary | ICD-10-CM | POA: Diagnosis not present

## 2015-01-30 LAB — CBC WITH DIFFERENTIAL/PLATELET
BASO%: 1.3 % (ref 0.0–2.0)
Basophils Absolute: 0.1 10*3/uL (ref 0.0–0.1)
EOS%: 1 % (ref 0.0–7.0)
Eosinophils Absolute: 0.1 10*3/uL (ref 0.0–0.5)
HCT: 35.5 % (ref 34.8–46.6)
HGB: 11.1 g/dL — ABNORMAL LOW (ref 11.6–15.9)
LYMPH%: 8.1 % — AB (ref 14.0–49.7)
MCH: 25.3 pg (ref 25.1–34.0)
MCHC: 31.4 g/dL — AB (ref 31.5–36.0)
MCV: 80.8 fL (ref 79.5–101.0)
MONO#: 0.6 10*3/uL (ref 0.1–0.9)
MONO%: 4.9 % (ref 0.0–14.0)
NEUT#: 9.7 10*3/uL — ABNORMAL HIGH (ref 1.5–6.5)
NEUT%: 84.7 % — AB (ref 38.4–76.8)
Platelets: 343 10*3/uL (ref 145–400)
RBC: 4.4 10*6/uL (ref 3.70–5.45)
RDW: 17.9 % — ABNORMAL HIGH (ref 11.2–14.5)
WBC: 11.5 10*3/uL — ABNORMAL HIGH (ref 3.9–10.3)
lymph#: 0.9 10*3/uL (ref 0.9–3.3)

## 2015-01-30 LAB — TECHNOLOGIST REVIEW

## 2015-01-31 ENCOUNTER — Telehealth: Payer: Self-pay | Admitting: *Deleted

## 2015-01-31 LAB — FERRITIN: Ferritin: 212 ng/ml (ref 9–269)

## 2015-01-31 NOTE — Telephone Encounter (Signed)
Pt called wanting to know results of her Ferritin level done 01/30/15.  Gave pt result  Level 212 .   Pt voiced understanding.

## 2015-02-01 ENCOUNTER — Telehealth: Payer: Self-pay | Admitting: *Deleted

## 2015-02-01 NOTE — Telephone Encounter (Signed)
-----   Message from Ladell Pier, MD sent at 01/31/2015  3:39 PM EST ----- Please call patient, hb and iron are ok, f/u as scheduled

## 2015-02-01 NOTE — Telephone Encounter (Signed)
Left message on voicemail informing pt HGB and iron are OK, per Dr. Benay Spice. Follow up as scheduled.

## 2015-06-18 ENCOUNTER — Telehealth: Payer: Self-pay | Admitting: Oncology

## 2015-06-18 NOTE — Telephone Encounter (Signed)
pt called to resched 6/26 apt to next available which was 7/10

## 2015-07-08 ENCOUNTER — Other Ambulatory Visit: Payer: BC Managed Care – PPO

## 2015-07-08 ENCOUNTER — Ambulatory Visit: Payer: BC Managed Care – PPO | Admitting: Oncology

## 2015-07-22 ENCOUNTER — Telehealth: Payer: Self-pay | Admitting: Oncology

## 2015-07-22 ENCOUNTER — Other Ambulatory Visit (HOSPITAL_BASED_OUTPATIENT_CLINIC_OR_DEPARTMENT_OTHER): Payer: BC Managed Care – PPO

## 2015-07-22 ENCOUNTER — Ambulatory Visit (HOSPITAL_BASED_OUTPATIENT_CLINIC_OR_DEPARTMENT_OTHER): Payer: BC Managed Care – PPO | Admitting: Oncology

## 2015-07-22 VITALS — BP 112/62 | HR 78 | Temp 98.6°F | Resp 18 | Ht 65.0 in | Wt 159.1 lb

## 2015-07-22 DIAGNOSIS — R161 Splenomegaly, not elsewhere classified: Secondary | ICD-10-CM | POA: Diagnosis not present

## 2015-07-22 DIAGNOSIS — D509 Iron deficiency anemia, unspecified: Secondary | ICD-10-CM | POA: Diagnosis not present

## 2015-07-22 DIAGNOSIS — D473 Essential (hemorrhagic) thrombocythemia: Secondary | ICD-10-CM

## 2015-07-22 LAB — CBC WITH DIFFERENTIAL/PLATELET
BASO%: 0.5 % (ref 0.0–2.0)
Basophils Absolute: 0.1 10*3/uL (ref 0.0–0.1)
EOS%: 1.1 % (ref 0.0–7.0)
Eosinophils Absolute: 0.1 10*3/uL (ref 0.0–0.5)
HEMATOCRIT: 37.2 % (ref 34.8–46.6)
HGB: 11.3 g/dL — ABNORMAL LOW (ref 11.6–15.9)
LYMPH%: 8.9 % — AB (ref 14.0–49.7)
MCH: 25.4 pg (ref 25.1–34.0)
MCHC: 30.4 g/dL — AB (ref 31.5–36.0)
MCV: 83.6 fL (ref 79.5–101.0)
MONO#: 0.7 10*3/uL (ref 0.1–0.9)
MONO%: 5.4 % (ref 0.0–14.0)
NEUT#: 10.4 10*3/uL — ABNORMAL HIGH (ref 1.5–6.5)
NEUT%: 84.1 % — AB (ref 38.4–76.8)
PLATELETS: 344 10*3/uL (ref 145–400)
RBC: 4.45 10*6/uL (ref 3.70–5.45)
RDW: 17.8 % — ABNORMAL HIGH (ref 11.2–14.5)
WBC: 12.4 10*3/uL — ABNORMAL HIGH (ref 3.9–10.3)
lymph#: 1.1 10*3/uL (ref 0.9–3.3)
nRBC: 1 % — ABNORMAL HIGH (ref 0–0)

## 2015-07-22 LAB — TECHNOLOGIST REVIEW

## 2015-07-22 NOTE — Telephone Encounter (Signed)
per pof to sch pt appt-gave pt copy of avs °

## 2015-07-22 NOTE — Progress Notes (Signed)
  [place] OFFICE PROGRESS NOTE   Diagnosis: Essential thrombocytosis  INTERVAL HISTORY:   Lynn Mccarthy returns as scheduled. She feels well. She is exercising. She reports chronic diminished endurance. She has noted tenderness and erythematous areas at the lower leg bilaterally for the past 3 months. No leg swelling.  Objective:  Vital signs in last 24 hours:  Blood pressure 112/62, pulse 78, temperature 98.6 F (37 C), temperature source Oral, resp. rate 18, height 5\' 5"  (1.651 m), weight 159 lb 1.6 oz (72.167 kg), SpO2 100 %.    Resp: Lungs clear bilaterally Cardio: Regular rate and rhythm GI: No hepatomegaly, the spleen is palpable in the left upper abdomen extending to above the umbilicus Vascular: No leg edema, small varicosities at the lower leg bilaterally, no palpable cord  Skin: Few patches of flat erythema at the pretibial area and calf bilaterally, associated mild tenderness, no vesicles or pustules     Lab Results:  Lab Results  Component Value Date   WBC 12.4* 07/22/2015   HGB 11.3* 07/22/2015   HCT 37.2 07/22/2015   MCV 83.6 07/22/2015   PLT 344 07/22/2015   NEUTROABS 10.4* 07/22/2015     Medications: I have reviewed the patient's current medications.  Assessment/Plan: 1. Essential thrombocytosis. The platelet count is in the normal range. Stable splenomegaly. 2. Microcytic anemia. Improved with iron therapy   Disposition:  She is stable from a hematologic standpoint. She will continue iron. Lynn Mccarthy will return for CBC in 6 months and an office visit in one year.  The etiology of the areas of leg erythema is unclear. I doubt this is related to the myeloproliferative disorder. She will follow-up with her primary physician or dermatologist if these areas persist.  Betsy Coder, MD  07/22/2015  9:25 AM

## 2015-08-22 ENCOUNTER — Other Ambulatory Visit: Payer: Self-pay | Admitting: Obstetrics and Gynecology

## 2015-08-22 DIAGNOSIS — Z1231 Encounter for screening mammogram for malignant neoplasm of breast: Secondary | ICD-10-CM

## 2015-08-23 ENCOUNTER — Ambulatory Visit
Admission: RE | Admit: 2015-08-23 | Discharge: 2015-08-23 | Disposition: A | Discharge status: Home / Self Care | Payer: BC Managed Care – PPO | Source: Ambulatory Visit | Attending: Obstetrics and Gynecology | Admitting: Obstetrics and Gynecology

## 2015-08-23 DIAGNOSIS — Z1231 Encounter for screening mammogram for malignant neoplasm of breast: Secondary | ICD-10-CM

## 2015-09-21 ENCOUNTER — Encounter: Payer: Self-pay | Admitting: Family Medicine

## 2015-09-21 ENCOUNTER — Ambulatory Visit (INDEPENDENT_AMBULATORY_CARE_PROVIDER_SITE_OTHER): Payer: BC Managed Care – PPO

## 2015-09-21 ENCOUNTER — Ambulatory Visit (INDEPENDENT_AMBULATORY_CARE_PROVIDER_SITE_OTHER): Payer: BC Managed Care – PPO | Admitting: Family Medicine

## 2015-09-21 VITALS — BP 122/70 | HR 101 | Temp 98.2°F | Resp 18 | Ht 65.5 in | Wt 160.4 lb

## 2015-09-21 DIAGNOSIS — S93402A Sprain of unspecified ligament of left ankle, initial encounter: Secondary | ICD-10-CM | POA: Diagnosis not present

## 2015-09-21 MED ORDER — MELOXICAM 15 MG PO TABS: 15.0000 mg | ORAL_TABLET | Freq: Every day | ORAL | 1 refills | Status: DC

## 2015-09-21 NOTE — Progress Notes (Signed)
Subjective:  By signing my name below, I, [name], attest that this documentation has been prepared under the direction and in the presence of Delman Cheadle, MD.  Electronically Signed: Thea Alken, ED Scribe. 09/21/2015. 8:21 AM.   Patient ID: Lynn Mccarthy, female    DOB: January 26, 1970, 45 y.o.   MRN: [id number]  HPI Chief Complaint  Patient presents with  . Ankle Pain    Left x 3 weeks, swelling    HPI Comments: Lynn Mccarthy is a 45 y.o. female who presents to the [facility] complaining of gradually worsening left ankle pain that began 3 weeks ago. Pt recently  Started yoga and walking more, but no known injury. She noticed ankle swelling last night while doing yoga. She has been using swedo ankle brace as needed but has not tried ice or OTC medication. She denies chance of pregnancy.    Patient Active Problem List   Diagnosis Date Noted  . Essential thrombocytosis ([place]) 01/30/2015  . Chronic left shoulder pain 08/22/2013  . Acromioclavicular joint separation 08/22/2013   Past Medical History:  Diagnosis Date  . Bacterial infection   . Basal cell carcinoma of skin 06/2011  . Candida vaginitis   . Fibrocystic breast   . History of chicken pox   . HPV (human papilloma virus) infection   . Ovarian cyst   . Thrombocytosis ([place])   . Vulvar inflammation   . Yeast infection    Past Surgical History:  Procedure Laterality Date  . BONE MARROW BIOPSY    . SHOULDER SURGERY  11/2009   Allergies  Allergen Reactions  . Doxycycline Nausea Only    Stomach cramps  . Paxil [Paroxetine Hcl] Other (See Comments)    Made her dazed   Prior to Admission medications   Medication Sig Start Date End Date Taking? Authorizing Provider  Ferrous Fumarate (HEMOCYTE PO) Take 1 tablet by mouth daily. Reported on 07/22/2015    Historical Provider, MD  Multiple Vitamin (MULTIVITAMIN) tablet Take 1 tablet by mouth daily.    Historical Provider, MD   Social History   Social  History  . Marital status: Married    Spouse name: N/A  . Number of children: N/A  . Years of education: N/A   Occupational History  . Not on file.   Social History Main Topics  . Smoking status: Never Smoker  . Smokeless tobacco: Never Used  . Alcohol use Yes  . Drug use: No  . Sexual activity: Yes    Birth control/ protection: Surgical     Comment: VASECTOMY   Other Topics Concern  . Not on file   Social History Narrative  . No narrative on file   Review of Systems  Constitutional: Negative for chills and fever.  Musculoskeletal: Positive for arthralgias and myalgias. Negative for gait problem.  Skin: Negative for color change, rash and wound.  Neurological: Negative for weakness and numbness.       Objective:   Physical Exam  Constitutional: She is oriented to person, place, and time. She appears well-developed and well-nourished. No distress.  HENT:  Head: Normocephalic and atraumatic.  Eyes: Conjunctivae and EOM are normal.  Neck: Neck supple.  Cardiovascular: Normal rate.   Musculoskeletal: Normal range of motion.  Swelling over right anterior to lateral malleolus. No tenderness directly over malleolus or 5th metatarsal head. No tenderness over CF ligament. Tender to palpation over AITF ligament. With Staple test she does have pain with inversion.   Neurological:  She is alert and oriented to person, place, and time.  Skin: Skin is warm and dry.  Psychiatric: She has a normal mood and affect. Her behavior is normal.  Nursing note and vitals reviewed.  Vitals:   09/21/15 0819  BP: 122/70  Pulse: (!) 101  Resp: 18  Temp: 98.2 F (36.8 C)  TempSrc: Oral  SpO2: 100%  Weight: 160 lb 6.4 oz (72.8 kg)  Height: 5' 5.5" (1.664 m)    Assessment & Plan:   1. Sprain of ankle, left, initial encounter   Start using swede-o brace daily rather than prn. Start meloxicam. RICE. Start ROM exercises Expect pain to resolve in about 2 wks after which can change to use  swede-o during activity/exercise and start strengthening exercises.  RTC if worsening or not completely resolved in 4-6 wks.  Orders Placed This Encounter  Procedures  . DG Ankle Complete Left    Standing Status:   Future    Number of Occurrences:   1    Standing Expiration Date:   09/20/2016    Order Specific Question:   Reason for Exam (SYMPTOM  OR DIAGNOSIS REQUIRED)    Answer:   left lateral ankle pain anterior to malleolus worsening x 3 wks, no known injury    Order Specific Question:   Is the patient pregnant?    Answer:   No    Order Specific Question:   Preferred imaging location?    Answer:   External    Meds ordered this encounter  Medications  . Probiotic Product (PROBIOTIC PO)    Sig: Take by mouth.  . meloxicam (MOBIC) 15 MG tablet    Sig: Take 1 tablet (15 mg total) by mouth daily.    Dispense:  30 tablet    Refill:  1    I personally performed the services described in this documentation, which was scribed in my presence. The recorded information has been reviewed and considered, and addended by me as needed.   Eva Shaw, M.D.  [facility]  [place] [address] [place],  [address] [phone] phone [phone] fax  09/24/15 11:18 PM  

## 2015-09-21 NOTE — Patient Instructions (Addendum)
IF you received an x-ray today, you will receive an invoice from [facility]. Please contact [facility] at [phone] with questions or concerns regarding your invoice.   IF you received labwork today, you will receive an invoice from [employer]. Please contact [facility] at [phone] with questions or concerns regarding your invoice.   Our billing staff will not be able to assist you with questions regarding bills from these companies.  You will be contacted with the lab results as soon as they are available. The fastest way to get your results is to activate your My Chart account. Instructions are located on the last page of this paperwork. If you have not heard from [place] regarding the results in 2 weeks, please contact this office.     Acute Ankle Sprain With Phase I Rehab An acute ankle sprain is a partial or complete tear in one or more of the ligaments of the ankle due to traumatic injury. The severity of the injury depends on both the number of ligaments sprained and the grade of sprain. There are 3 grades of sprains.   A grade 1 sprain is a mild sprain. There is a slight pull without obvious tearing. There is no loss of strength, and the muscle and ligament are the correct length.  A grade 2 sprain is a moderate sprain. There is tearing of fibers within the substance of the ligament where it connects two bones or two cartilages. The length of the ligament is increased, and there is usually decreased strength.  A grade 3 sprain is a complete rupture of the ligament and is uncommon. In addition to the grade of sprain, there are three types of ankle sprains.  Lateral ankle sprains: This is a sprain of one or more of the three ligaments on the outer side (lateral) of the ankle. These are the most common sprains. Medial ankle sprains: There is one large triangular ligament of the inner side (medial) of the ankle that is susceptible to  injury. Medial ankle sprains are less common. Syndesmosis, "high ankle," sprains: The syndesmosis is the ligament that connects the two bones of the lower leg. Syndesmosis sprains usually only occur with very severe ankle sprains. SYMPTOMS  Pain, tenderness, and swelling in the ankle, starting at the side of injury that may progress to the whole ankle and foot with time.  "Pop" or tearing sensation at the time of injury.  Bruising that may spread to the heel.  Impaired ability to walk soon after injury. CAUSES   Acute ankle sprains are caused by trauma placed on the ankle that temporarily forces or pries the anklebone (talus) out of its normal socket.  Stretching or tearing of the ligaments that normally hold the joint in place (usually due to a twisting injury). RISK INCREASES WITH:  Previous ankle sprain.  Sports in which the foot may land awkwardly (i.e., basketball, volleyball, or soccer) or walking or running on uneven or rough surfaces.  Shoes with inadequate support to prevent sideways motion when stress occurs.  Poor strength and flexibility.  Poor balance skills.  Contact sports. PREVENTION   Warm up and stretch properly before activity.  Maintain physical fitness:  Ankle and leg flexibility, muscle strength, and endurance.  Cardiovascular fitness.  Balance training activities.  Use proper technique and have a coach correct improper technique.  Taping, protective strapping, bracing, or high-top tennis shoes may help prevent injury. Initially, tape is best; however, it loses most of its  support function within 10 to 15 minutes.  Wear proper-fitted protective shoes (High-top shoes with taping or bracing is more effective than either alone).  Provide the ankle with support during sports and practice activities for 12 months following injury. PROGNOSIS   If treated properly, ankle sprains can be expected to recover completely; however, the length of recovery  depends on the degree of injury.  A grade 1 sprain usually heals enough in 5 to 7 days to allow modified activity and requires an average of 6 weeks to heal completely.  A grade 2 sprain requires 6 to 10 weeks to heal completely.  A grade 3 sprain requires 12 to 16 weeks to heal.  A syndesmosis sprain often takes more than 3 months to heal. RELATED COMPLICATIONS   Frequent recurrence of symptoms may result in a chronic problem. Appropriately addressing the problem the first time decreases the frequency of recurrence and optimizes healing time. Severity of the initial sprain does not predict the likelihood of later instability.  Injury to other structures (bone, cartilage, or tendon).  A chronically unstable or arthritic ankle joint is a possibility with repeated sprains. TREATMENT Treatment initially involves the use of ice, medication, and compression bandages to help reduce pain and inflammation. Ankle sprains are usually immobilized in a walking cast or boot to allow for healing. Crutches may be recommended to reduce pressure on the injury. After immobilization, strengthening and stretching exercises may be necessary to regain strength and a full range of motion. Surgery is rarely needed to treat ankle sprains. MEDICATION   Nonsteroidal anti-inflammatory medications, such as aspirin and ibuprofen (do not take for the first 3 days after injury or within 7 days before surgery), or other minor pain relievers, such as acetaminophen, are often recommended. Take these as directed by your caregiver. Contact your caregiver immediately if any bleeding, stomach upset, or signs of an allergic reaction occur from these medications.  Ointments applied to the skin may be helpful.  Pain relievers may be prescribed as necessary by your caregiver. Do not take prescription pain medication for longer than 4 to 7 days. Use only as directed and only as much as you need. HEAT AND COLD  Cold treatment (icing)  is used to relieve pain and reduce inflammation for acute and chronic cases. Cold should be applied for 10 to 15 minutes every 2 to 3 hours for inflammation and pain and immediately after any activity that aggravates your symptoms. Use ice packs or an ice massage.  Heat treatment may be used before performing stretching and strengthening activities prescribed by your caregiver. Use a heat pack or a warm soak. SEEK IMMEDIATE MEDICAL CARE IF:   Pain, swelling, or bruising worsens despite treatment.  You experience pain, numbness, discoloration, or coldness in the foot or toes.  New, unexplained symptoms develop (drugs used in treatment may produce side effects.) EXERCISES  PHASE I EXERCISES RANGE OF MOTION (ROM) AND STRETCHING EXERCISES - Ankle Sprain, Acute Phase I, Weeks 1 to 2 These exercises may help you when beginning to restore flexibility in your ankle. You will likely work on these exercises for the 1 to 2 weeks after your injury. Once your physician, physical therapist, or athletic trainer sees adequate progress, he or she will advance your exercises. While completing these exercises, remember:   Restoring tissue flexibility helps normal motion to return to the joints. This allows healthier, less painful movement and activity.  An effective stretch should be held for at least 30 seconds.  A stretch should never be painful. You should only feel a gentle lengthening or release in the stretched tissue. RANGE OF MOTION - Dorsi/Plantar Flexion  While sitting with your right / left knee straight, draw the top of your foot upwards by flexing your ankle. Then reverse the motion, pointing your toes downward.  Hold each position for __________ seconds.  After completing your first set of exercises, repeat this exercise with your knee bent. Repeat __________ times. Complete this exercise __________ times per day.  RANGE OF MOTION - Ankle Alphabet  Imagine your right / left big toe is a  pen.  Keeping your hip and knee still, write out the entire alphabet with your "pen." Make the letters as large as you can without increasing any discomfort. Repeat __________ times. Complete this exercise __________ times per day.  STRENGTHENING EXERCISES - Ankle Sprain, Acute -Phase I, Weeks 1 to 2 These exercises may help you when beginning to restore strength in your ankle. You will likely work on these exercises for 1 to 2 weeks after your injury. Once your physician, physical therapist, or athletic trainer sees adequate progress, he or she will advance your exercises. While completing these exercises, remember:   Muscles can gain both the endurance and the strength needed for everyday activities through controlled exercises.  Complete these exercises as instructed by your physician, physical therapist, or athletic trainer. Progress the resistance and repetitions only as guided.  You may experience muscle soreness or fatigue, but the pain or discomfort you are trying to eliminate should never worsen during these exercises. If this pain does worsen, stop and make certain you are following the directions exactly. If the pain is still present after adjustments, discontinue the exercise until you can discuss the trouble with your clinician. STRENGTH - Dorsiflexors  Secure a rubber exercise band/tubing to a fixed object (i.e., table, pole) and loop the other end around your right / left foot.  Sit on the floor facing the fixed object. The band/tubing should be slightly tense when your foot is relaxed.  Slowly draw your foot back toward you using your ankle and toes.  Hold this position for __________ seconds. Slowly release the tension in the band and return your foot to the starting position. Repeat __________ times. Complete this exercise __________ times per day.  STRENGTH - Plantar-flexors   Sit with your right / left leg extended. Holding onto both ends of a rubber exercise band/tubing,  loop it around the ball of your foot. Keep a slight tension in the band.  Slowly push your toes away from you, pointing them downward.  Hold this position for __________ seconds. Return slowly, controlling the tension in the band/tubing. Repeat __________ times. Complete this exercise __________ times per day.  STRENGTH - Ankle Eversion  Secure one end of a rubber exercise band/tubing to a fixed object (table, pole). Loop the other end around your foot just before your toes.  Place your fists between your knees. This will focus your strengthening at your ankle.  Drawing the band/tubing across your opposite foot, slowly, pull your little toe out and up. Make sure the band/tubing is positioned to resist the entire motion.  Hold this position for __________ seconds. Have your muscles resist the band/tubing as it slowly pulls your foot back to the starting position.  Repeat __________ times. Complete this exercise __________ times per day.  STRENGTH - Ankle Inversion  Secure one end of a rubber exercise band/tubing to a fixed object (table, pole).  Loop the other end around your foot just before your toes.  Place your fists between your knees. This will focus your strengthening at your ankle.  Slowly, pull your big toe up and in, making sure the band/tubing is positioned to resist the entire motion.  Hold this position for __________ seconds.  Have your muscles resist the band/tubing as it slowly pulls your foot back to the starting position. Repeat __________ times. Complete this exercises __________ times per day.  STRENGTH - Towel Curls  Sit in a chair positioned on a non-carpeted surface.  Place your right / left foot on a towel, keeping your heel on the floor.  Pull the towel toward your heel by only curling your toes. Keep your heel on the floor.  If instructed by your physician, physical therapist, or athletic trainer, add weight to the end of the towel. Repeat __________  times. Complete this exercise __________ times per day.   This information is not intended to replace advice given to you by your health care provider. Make sure you discuss any questions you have with your health care provider.   Document Released: 07/30/2004 Document Revised: 01/19/2014 Document Reviewed: 04/12/2008 [employer] Interactive Patient Education 2016 [employer]  Chronic Ankle Instability With Rehab Chronic ankle instability is characterized by instability of the ankle for a prolonged period of time. There are two types of ankle instability.   A functionally unstable ankle is one that gives way; however, it may or may not be loose.  A mechanically unstable ankle is one that is loose due to a problem with the ligaments. However, not all loose ankles are unstable or give way. SYMPTOMS   Recurrent ankle pain and giving way of the ankle.  Difficulty running on uneven surfaces, jumping, or changing directions while running (cutting).  Pain, tenderness, swelling, and bruising at the site of injury.  Weakness or looseness in the ankle joint.  Occasionally, impaired ability to walk soon after injury. CAUSES   Ankle instability is most commonly caused by a previous ankle injury that did not completely heal.  Ankle instability may also be caused by stress imposed from either side of the ankle joint that can temporarily force or pry the ankle bone (talus) out of its normal alignment. The ligaments that hold the joint in place are stretched and torn. RISK INCREASES WITH:  Previous ankle injury.  You were born with (congenital) joint looseness.  Too-rapid return to activity after previous ankle sprain.  Activities in which the foot may land sideways while running, walking, and jumping (basketball, volleyball, or soccer) or walking or running on uneven or rough surfaces.  Inadequate ankle support during athletics.  Poor strength and flexibility.  Poor balance  skills. PREVENTION  Warm up and stretch properly before activity.  Maintain physical fitness:  Ankle and leg flexibility, muscle strength, and endurance.  Balanced training activities.  Cardiovascular fitness.  Learn and use proper technique during sports and have a coach correct improper technique.  Taping, protective strapping, bracing, or high-top tennis shoes may be used. Initially, tape is best; however, it loses most of its support function within 10 to 15 minutes.  Wear proper protective shoes (high-top shoes with taping or bracing).  Provide the ankle with support during sports and practice activities for 12 months following injury.  Complete rehabilitation after initial injury. PROGNOSIS  If treated properly, ankle instability normally resolves with non-surgical treatment. However, for certain cases of mechanical instability surgery is necessary. RELATED COMPLICATIONS   Frequent  recurrence of symptoms is possible. Following rehabilitation guidelines correctly decreases the frequency of recurrence and optimizes healing time.  Injury to other structures (bone, cartilage, or tendon).  Chronically unstable or arthritic ankle joint.  Complications of surgery including infection, bleeding, injury to nerves, continued giving way, ankle stiffness, and ankle weakness. TREATMENT Treatment initially involves ice, medication, and compression bandages are used to help reduce pain and inflammation, It may be necessary to immobilize the joint for a period of time to allow for healing. Strengthening and stretching exercises are recommended after immobilization to help regain strength and flexibility. These exercises may be completed at home or with a therapist. Some individuals find placing a heel wedge in the shoe, taping or bracing, and wearing high-top shoes helpful. If symptoms last for longer than 3 months, despite treatment, then surgery may be recommended. HEAT AND COLD  Cold  treatment (icing) relieves pain and reduces inflammation. Cold treatment should be applied for 10 to 15 minutes every 2 to 3 hours for inflammation and pain and immediately after any activity that aggravates your symptoms. Use ice packs or an ice massage.  Heat treatment may be used prior to performing the stretching and strengthening activities prescribed by your caregiver, physical therapist, or athletic trainer. Use a heat pack or a warm soak. MEDICATION   There are no specific medications to improve the stability of your ankle.  If pain medication is necessary, then nonsteroidal anti-inflammatory medications, such as aspirin and ibuprofen, or other minor pain relievers, such as acetaminophen, are often recommended.  Do not take pain medication within 7 days before surgery.  Prescription pain relievers may be prescribed if deemed necessary by your caregiver. Use only as directed and only as much as you need.  Ointments applied to the skin may be helpful. SEEK MEDICAL CARE IF:   Pain, swelling, or bruising worsens despite treatment.  You develop locking or catching in the ankle.  You have pain, numbness, or coldness in the foot.  You develop giving way of the ankle which persists after 3 to 6 months of rehabilitation. EXERCISES  RANGE OF MOTION AND STRETCHING EXERCISES - Ankle Instability, Chronic, Non-Surgical Intervention Since ankles demonstrate instability when they have too much motion throughout the joints, range of motion and stretching exercises are not helpful and can even be harmful. Only complete range of motion and stretching exercises for your ankle if instructed by your physician, physical therapist or athletic trainer. An effective rehabilitation program for unstable ankles will include mostly strengthening and balance exercises. STRENGTHENING EXERCISES - Ankle Instability, Chronic, Non-Surgical Intervention  These exercises may help you when beginning to rehabilitate  your injury. They may resolve your symptoms with or without further involvement from your physician, physical therapist or athletic trainer. While completing these exercises, remember:  Muscles can gain both the endurance and the strength needed for everyday activities through controlled exercises.  Complete these exercises as instructed by your physician, physical therapist or athletic trainer. Progress the resistance and repetitions only as guided.  You may experience muscle soreness or fatigue, but the pain or discomfort you are trying to eliminate should never worsen during these exercises. If this pain does worsen, stop and make certain you are following the directions exactly. If the pain is still present after adjustments, discontinue the exercise until you can discuss the trouble with your clinician. STRENGTH - Dorsiflexors  Secure a rubber exercise band/tubing to a fixed object (table, pole) and loop the other end around your right / left  foot.  Sit on the floor facing the fixed object. The band/tubing should be slightly tense when your foot is relaxed.  Slowly draw your foot back toward you using your ankle and toes.  Hold this position for __________ seconds. Slowly release the tension in the band and return your foot to the starting position. Repeat __________ times. Complete this exercise __________ times per day.  STRENGTH - Plantar-flexors  Sit with your right / left leg extended. Holding onto both ends of a rubber exercise band/tubing, loop it around the ball of your foot. Keep a slight tension in the band.  Slowly push your toes away from you, pointing them downward.  Hold this position for __________ seconds. Return slowly, controlling the tension in the band/tubing. Repeat __________ times. Complete this exercise __________ times per day.  STRENGTH - Plantar-flexors, Standing   Stand with your feet shoulder width apart. Steady yourself with a wall or table using as  little support as needed.  Keeping your weight evenly spread over the width of your feet, rise up on your toes.*  Hold this position for __________ seconds. Repeat __________ times. Complete this exercise __________ times per day.  *If this is too easy, shift your weight toward your right / left leg until you feel challenged. Ultimately, you may be asked to do this exercise with your right / left foot only. STRENGTH - Ankle Eversion  Secure one end of a rubber exercise band/tubing to a fixed object (table, pole). Loop the other end around your foot just before your toes.  Place your fists between your knees. This will focus your strengthening at your ankle.  Drawing the band/tubing across your opposite foot, slowly, pull your little toe out and up. Make sure the band/tubing is positioned to resist the entire motion.  Hold this position for __________ seconds.  Have your muscles resist the band/tubing as it slowly pulls your foot back to the starting position. Repeat __________ times. Complete this exercise __________ times per day.  STRENGTH - Ankle Inversion  Secure one end of a rubber exercise band/tubing to a fixed object (table, pole). Loop the other end around your foot just before your toes.  Place your fists between your knees. This will focus your strengthening at your ankle.  Slowly, pull your big toe up and in, making sure the band/tubing is positioned to resist the entire motion.  Hold this position for __________ seconds.  Have your muscles resist the band/tubing as it slowly pulls your foot back to the starting position. Repeat __________ times. Complete this exercises __________ times per day.  STRENGTH - Towel Curls  Sit in a chair positioned on a non-carpeted surface.  Place your foot on a towel, keeping your heel on the floor.  Pull the towel toward your heel by only curling your toes. Keep your heel on the floor.  If instructed by your physician, physical  therapist or athletic trainer, add weight to the end of the towel. Repeat __________ times. Complete this exercise __________ times per day. STRENGTH - Dorsiflexors and Plantar-flexors, Heel/toe Walking  Dorsiflexion: Walk on your heels only. Keep your toes as high as possible.  Repeat __________ times. Complete __________ times per day.  Plantar flexion: Walk on your toes only. Keep your heels as high as possible.  Walk for ____________________ seconds/feet. Repeat __________ times. Complete __________ times per day.  BALANCE - Tandem Walking  Place your uninjured foot on a line 2-4 inches wide and at least 10 feet long.  Keeping your balance without using anything for extra support, place your right / left heel directly in front of your other foot.  Slowly raise your back foot up, lifting from the heel to the toes, and place it directly in front of the right / left foot.  Continue to walk along the line slowly. Walk for ____________________ feet. Repeat ____________________ times. Complete ____________________ times per day. BALANCE - Inversion/Eversion Use caution, these are advanced level exercises. Do not begin them until you are advised to do so.   Create a balance board using a sturdy board about 1  feet long and at 1-1  feet wide and a 1  inch diameter rod or pipe that is as long as the board's width. A copper pipe or a solid broomstick work well.  Stand on a non-carpeted surface near a countertop or wall. Step onto the board so that your feet are hip-width apart and equally straddle the rod/pipe.  Keeping your feet in place, complete these two exercises without shifting your upper body or hips:  Tip the board from side-to-side. Control the movement so the board does not forcefully strike the ground. The board should silently tap the ground.  Tip the board side-to-side without striking the ground. Occasionally pause and maintain a steady position at various  points.  Repeat the first two exercises, but use only your right / left foot. Place your right / left foot directly over the rod/pipe. Repeat __________ times. Complete this exercise __________ times a day. BALANCE - Plantar/Dorsi Flexion Use caution, these are advanced level exercises. Do not begin them until you are advised to do so.  Create a balance board using a sturdy board about 1  feet long and at 1-1  feet wide and a 1  inch diameter rod or pipe that is as long as the board's width. A copper pipe or a solid broomstick work well.  Stand on a non-carpeted surface near a countertop or wall. Stand on the board so that the rod/pipe runs under the arches in your feet.  Keeping your feet in place, complete these two exercises without shifting your upper body or hips:  Tip the board from side-to-side. Control the movement so the board does not forcefully strike the ground. The board should silently tap the ground.  Tip the board side-to-side without striking the ground. Occasionally pause and maintain a steady position at various points.  Repeat the first two exercises, but use only your right / left foot. Stand in the center of the board. Repeat __________ times. Complete this exercise __________ times a day. STRENGTH - Plantar-flexors, Eccentric Note: This exercise can place a lot of stress on your foot and ankle. Please complete this exercise only if specifically instructed by your caregiver.   Place the balls of your feet on a step. With your hands, use only enough support from a wall or rail to keep your balance.  Keep your knees straight and rise up on your toes.  Slowly shift your weight entirely to your toes and pick up your opposite foot. Gently and with controlled movement, lower your weight through your right / left foot so that your heel drops below the level of the step. You will feel a slight stretch in the back of your calf at the ending position.  Use the healthy leg  to help rise up onto the balls of both feet, then lower weight only on the right / left leg again. Build up to 15 repetitions.  Then progress to 3 consecutive sets of 15 repetitions.*  After completing the above exercise, complete the same exercise with a slight knee bend (about 30 degrees). Again, build up to 15 repetitions. Then progress to 3 consecutive sets of 15 repetitions.* Perform this exercise __________ times per day.  *When you easily complete 3 sets of 15, your physician, physical therapist or athletic trainer may advise you to add resistance by wearing a backpack filled with additional weight.   This information is not intended to replace advice given to you by your health care provider. Make sure you discuss any questions you have with your health care provider.   Document Released: 07/30/2004 Document Revised: 01/19/2014 Document Reviewed: 04/12/2008 Elsevier Interactive Patient Education [employer].

## 2015-10-01 ENCOUNTER — Telehealth: Payer: Self-pay

## 2015-10-01 DIAGNOSIS — M25572 Pain in left ankle and joints of left foot: Secondary | ICD-10-CM

## 2015-10-01 NOTE — Telephone Encounter (Signed)
Dr Brigitte Pulse   Patient states her foot/ankle is much worse.  She was asking if you wanted her to switch up to a boot or get a referral to a specialist. [phone]

## 2015-10-01 NOTE — Telephone Encounter (Signed)
Oh no!  Will refer.

## 2015-10-02 ENCOUNTER — Telehealth: Payer: Self-pay

## 2015-10-02 NOTE — Telephone Encounter (Signed)
See other phone call. Pt was referred to ortho. If appt is to far off she is welcome to come in and see Dr. Carlota Raspberry - our sports med dr and he can fit her for a different boot in the office - bring all of whatever slplints/braces she has at home with her.

## 2015-10-02 NOTE — Telephone Encounter (Signed)
Patient stated she have an ankle sprained. Patient want to know if she need a boot or be referred to an orthopedic. Patient stated she watched a video and she may need a level 3 boot. [phone].

## 2015-10-03 NOTE — Telephone Encounter (Signed)
Attempted to call pt. Message left regarding ankle injury. Has been referred to ortho. If appointment is too far out to make an appointment to see Dr. Carlota Mccarthy. Also left phone number to call back if needed.

## 2015-11-12 ENCOUNTER — Other Ambulatory Visit: Payer: Self-pay | Admitting: Family Medicine

## 2015-11-12 DIAGNOSIS — R1013 Epigastric pain: Secondary | ICD-10-CM

## 2015-11-22 ENCOUNTER — Ambulatory Visit
Admission: RE | Admit: 2015-11-22 | Discharge: 2015-11-22 | Disposition: A | Discharge status: Home / Self Care | Payer: BC Managed Care – PPO | Source: Ambulatory Visit | Attending: Family Medicine | Admitting: Family Medicine

## 2015-11-22 DIAGNOSIS — R1013 Epigastric pain: Secondary | ICD-10-CM

## 2016-01-22 ENCOUNTER — Other Ambulatory Visit: Payer: BC Managed Care – PPO

## 2016-01-23 ENCOUNTER — Other Ambulatory Visit (HOSPITAL_BASED_OUTPATIENT_CLINIC_OR_DEPARTMENT_OTHER): Payer: BC Managed Care – PPO

## 2016-01-23 DIAGNOSIS — D473 Essential (hemorrhagic) thrombocythemia: Secondary | ICD-10-CM

## 2016-01-23 LAB — CBC WITH DIFFERENTIAL/PLATELET
BASO%: 0.6 % (ref 0.0–2.0)
BASOS ABS: 0.1 10*3/uL (ref 0.0–0.1)
EOS%: 0.7 % (ref 0.0–7.0)
Eosinophils Absolute: 0.1 10*3/uL (ref 0.0–0.5)
HCT: 34.1 % — ABNORMAL LOW (ref 34.8–46.6)
HEMOGLOBIN: 10.5 g/dL — AB (ref 11.6–15.9)
LYMPH#: 1 10*3/uL (ref 0.9–3.3)
LYMPH%: 7.1 % — ABNORMAL LOW (ref 14.0–49.7)
MCH: 24.8 pg — ABNORMAL LOW (ref 25.1–34.0)
MCHC: 30.8 g/dL — ABNORMAL LOW (ref 31.5–36.0)
MCV: 80.4 fL (ref 79.5–101.0)
MONO#: 0.7 10*3/uL (ref 0.1–0.9)
MONO%: 5 % (ref 0.0–14.0)
NEUT%: 86.6 % — ABNORMAL HIGH (ref 38.4–76.8)
NEUTROS ABS: 12.2 10*3/uL — AB (ref 1.5–6.5)
NRBC: 1 % — AB (ref 0–0)
Platelets: 345 10*3/uL (ref 145–400)
RBC: 4.24 10*6/uL (ref 3.70–5.45)
RDW: 18.2 % — ABNORMAL HIGH (ref 11.2–14.5)
WBC: 14.1 10*3/uL — AB (ref 3.9–10.3)

## 2016-01-23 LAB — TECHNOLOGIST REVIEW

## 2016-01-24 ENCOUNTER — Telehealth: Payer: Self-pay | Admitting: *Deleted

## 2016-01-24 NOTE — Telephone Encounter (Signed)
Left message on identified voicemail informing pt of result per Dr. Gearldine Shown note below.

## 2016-01-24 NOTE — Telephone Encounter (Signed)
-----   Message from Ladell Pier, MD sent at 01/23/2016  5:22 PM EST ----- Please call patient, hb slightly lower, f/u as scheduled

## 2016-05-04 ENCOUNTER — Telehealth: Payer: Self-pay | Admitting: *Deleted

## 2016-05-04 NOTE — Telephone Encounter (Signed)
Call from pt reporting increased BLE edema, with erythema around toes. PCP has referred to vascular specialist. Pt asks if she should see Dr. Benay Spice. "Does he have any recommendations?"

## 2016-05-06 NOTE — Telephone Encounter (Signed)
Late entry for 4/23: returned call to pt, Dr. Benay Spice can see her 4/30 @ 4PM. She voiced understanding. Message to schedulers for appt.

## 2016-05-07 NOTE — Telephone Encounter (Signed)
Message from pt reporting she will see vascular specialist, inform them about her blood disorder and will see Dr. Benay Spice as needed. She had not received a call about 4/30 appt and will just hold off on that for now.  Dr. Benay Spice made aware.

## 2016-05-11 ENCOUNTER — Telehealth: Payer: Self-pay | Admitting: *Deleted

## 2016-05-11 NOTE — Telephone Encounter (Signed)
Message received from patient requesting an office visit to see Dr. Benay Spice this week.  She states that her vascular MD can not see her until July.  Message left for patient to notify her of available times for Lynn Mccarthy and Dr. Benay Spice.  Call received back from patient stating that she is able to come in tomorrow at 3:15PM to see Lynn Mccarthy.  Message sent to scheduling.

## 2016-05-12 ENCOUNTER — Telehealth: Payer: Self-pay | Admitting: Oncology

## 2016-05-12 ENCOUNTER — Ambulatory Visit (HOSPITAL_BASED_OUTPATIENT_CLINIC_OR_DEPARTMENT_OTHER): Payer: BC Managed Care – PPO | Admitting: Nurse Practitioner

## 2016-05-12 VITALS — BP 128/68 | HR 73 | Temp 98.9°F | Resp 16 | Wt 160.9 lb

## 2016-05-12 DIAGNOSIS — D473 Essential (hemorrhagic) thrombocythemia: Secondary | ICD-10-CM | POA: Diagnosis not present

## 2016-05-12 DIAGNOSIS — D509 Iron deficiency anemia, unspecified: Secondary | ICD-10-CM

## 2016-05-12 DIAGNOSIS — R21 Rash and other nonspecific skin eruption: Secondary | ICD-10-CM | POA: Diagnosis not present

## 2016-05-12 NOTE — Telephone Encounter (Signed)
Gave patient avs report and appointments for May  °

## 2016-05-12 NOTE — Patient Instructions (Signed)
Erythromelalgia Begin Aspirin 325 mg daily

## 2016-05-12 NOTE — Progress Notes (Addendum)
  [facility] OFFICE PROGRESS NOTE   Diagnosis:  Essential thrombocytosis  INTERVAL HISTORY:   Lynn Mccarthy returns for follow-up. She reports noticing redness with associated tenderness around the first and second toes on both feet a few months ago. Symptoms worsen with exercise. She feels a "burning" sensation. No blistering. She reports a persistent rash at the posterior upper arms, less so at the upper legs. She continues to have tenderness at the lower legs.  Objective:  Vital signs in last 24 hours:  Blood pressure 128/68, pulse 73, temperature 98.9 F (37.2 C), temperature source Oral, resp. rate 16, weight 160 lb 14.4 oz (73 kg), SpO2 100 %.    HEENT: No thrush or ulcers. Resp: Lungs clear bilaterally. Cardio: Regular rate and rhythm. GI: Abdomen is soft. Spleen is palpable left upper abdomen with associated tenderness. Vascular: No leg edema. Skin: Erythema involving the skin surrounding the first and second toenails on both feet. Erythematous somewhat nodular/firm rash posterior upper arm bilaterally.    Lab Results:  Lab Results  Component Value Date   WBC 14.1 (H) 01/23/2016   HGB 10.5 (L) 01/23/2016   HCT 34.1 (L) 01/23/2016   MCV 80.4 01/23/2016   PLT 345 01/23/2016   NEUTROABS 12.2 (H) 01/23/2016    Imaging:  No results found.  Medications: I have reviewed the patient's current medications.  Assessment/Plan: 1. Essential thrombocytosis. The platelet count is in the normal range. Stable splenomegaly. 2. Microcytic anemia. Improved with iron therapy   Disposition: Lynn Mccarthy appears stable. The etiology of the erythema and tenderness involving the toe nail beds and erythematous nodular rash at the upper arms is unclear. We discussed the possibility of erythromelalgia related to the myeloproliferative disorder. She will begin a trial of aspirin 325 mg daily. She will return for a follow-up visit in 3 weeks. If there is no improvement we  will consider referring her for a biopsy of the upper arm rash, evaluate for an autoimmune process.  Patient seen with Dr. Benay Spice. 25 minutes were spent face-to-face at today's visit with the majority of that time involved in counseling/coordination of care.    Ned Card ANP/GNP-BC   05/12/2016  3:34 PM  This was a shared visit with Ned Card. Lynn Mccarthy was interviewed and examined. The clinical presentation may be related to erythromelalgia, but some of the features are atypical. It is possible the skin changes are related to a collagen vascular disease or allergies. She will begin a trial of aspirin and return for an office visit in 3 weeks.  Julieanne Manson, M.D.

## 2016-05-21 ENCOUNTER — Other Ambulatory Visit: Payer: Self-pay | Admitting: *Deleted

## 2016-05-21 DIAGNOSIS — R600 Localized edema: Secondary | ICD-10-CM

## 2016-06-02 ENCOUNTER — Other Ambulatory Visit (HOSPITAL_BASED_OUTPATIENT_CLINIC_OR_DEPARTMENT_OTHER): Payer: BC Managed Care – PPO

## 2016-06-02 ENCOUNTER — Ambulatory Visit (HOSPITAL_BASED_OUTPATIENT_CLINIC_OR_DEPARTMENT_OTHER): Payer: BC Managed Care – PPO | Admitting: Nurse Practitioner

## 2016-06-02 VITALS — BP 130/85 | HR 90 | Temp 99.4°F | Resp 20 | Ht 65.5 in | Wt 158.7 lb

## 2016-06-02 DIAGNOSIS — D473 Essential (hemorrhagic) thrombocythemia: Secondary | ICD-10-CM

## 2016-06-02 DIAGNOSIS — L539 Erythematous condition, unspecified: Secondary | ICD-10-CM | POA: Diagnosis not present

## 2016-06-02 DIAGNOSIS — R161 Splenomegaly, not elsewhere classified: Secondary | ICD-10-CM | POA: Diagnosis not present

## 2016-06-02 DIAGNOSIS — R21 Rash and other nonspecific skin eruption: Secondary | ICD-10-CM

## 2016-06-02 DIAGNOSIS — D509 Iron deficiency anemia, unspecified: Secondary | ICD-10-CM | POA: Diagnosis not present

## 2016-06-02 LAB — CBC WITH DIFFERENTIAL/PLATELET
BASO%: 1.1 % (ref 0.0–2.0)
Basophils Absolute: 0.2 10*3/uL — ABNORMAL HIGH (ref 0.0–0.1)
EOS%: 1 % (ref 0.0–7.0)
Eosinophils Absolute: 0.2 10*3/uL (ref 0.0–0.5)
HEMATOCRIT: 35.2 % (ref 34.8–46.6)
HEMOGLOBIN: 11.1 g/dL — AB (ref 11.6–15.9)
LYMPH#: 1.1 10*3/uL (ref 0.9–3.3)
LYMPH%: 5.7 % — ABNORMAL LOW (ref 14.0–49.7)
MCH: 25.2 pg (ref 25.1–34.0)
MCHC: 31.5 g/dL (ref 31.5–36.0)
MCV: 79.9 fL (ref 79.5–101.0)
MONO#: 0.7 10*3/uL (ref 0.1–0.9)
MONO%: 3.7 % (ref 0.0–14.0)
NEUT%: 88.5 % — AB (ref 38.4–76.8)
NEUTROS ABS: 17 10*3/uL — AB (ref 1.5–6.5)
PLATELETS: 415 10*3/uL — AB (ref 145–400)
RBC: 4.41 10*6/uL (ref 3.70–5.45)
RDW: 18.7 % — ABNORMAL HIGH (ref 11.2–14.5)
WBC: 19.2 10*3/uL — ABNORMAL HIGH (ref 3.9–10.3)

## 2016-06-02 LAB — TECHNOLOGIST REVIEW: Technologist Review: 3

## 2016-06-02 NOTE — Progress Notes (Addendum)
  [facility] OFFICE PROGRESS NOTE   Diagnosis:  Essential thrombocytosis  INTERVAL HISTORY:   Lynn Mccarthy returns for follow-up. She has noted no significant improvement in the erythema and tenderness involving the toe nail beds since beginning aspirin following her last visit. The rash at the upper arms and thighs is unchanged as well.  Objective:  Vital signs in last 24 hours:  Blood pressure 130/85, pulse 90, temperature 99.4 F (37.4 C), temperature source Oral, resp. rate 20, height 5' 5.5" (1.664 m), weight 158 lb 11.2 oz (72 kg), SpO2 100 %.    HEENT: No thrush or ulcers. Resp: Lungs clear bilaterally. Cardio: Regular rate and rhythm. GI: Abdomen is soft. Spleen palpable left upper abdomen with associated tenderness. Vascular: No leg edema. Skin: Erythematous palpable rash posterior upper arm. Erythematous rash upper thigh bilaterally. Erythema involving the skin surrounding multiple toenails. There are a few small erythematous lesions on the buttocks.   Lab Results:  Lab Results  Component Value Date   WBC 19.2 (H) 06/02/2016   HGB 11.1 (L) 06/02/2016   HCT 35.2 06/02/2016   MCV 79.9 06/02/2016   PLT 415 (H) 06/02/2016   NEUTROABS 17.0 (H) 06/02/2016    Imaging:  No results found.  Medications: I have reviewed the patient's current medications.  Assessment/Plan: 1. Essential thrombocytosis. The platelet count is mildly elevated. Stable splenomegaly. 2. Microcytic anemia. Improved with iron therapy 3. Rash involving upper and lower extremities 4. Erythema/tenderness involving multiple toenails beds   Disposition: Lynn Mccarthy has essential thrombocytosis. The platelet count is mildly elevated on labs today. Hemoglobin is stable, white count elevated from baseline.   The toe nail bed erythema did not improve with the trial of aspirin. In addition she has a persistent rash on her arms and legs.  We discussed that the rash may be a  manifestation of the myeloproliferative disorder or could be unrelated. We are checking a sedimentation rate, ANA and rheumatoid factor. We are making a referral to dermatology.  With regard to follow-up she prefers to arrange for this once she has seen dermatology. She will contact the office to schedule.  Patient seen with Dr. Benay Spice. 25 minutes were spent face-to-face at today's visit with the majority of that time involved in counseling/coordination of care.    Ned Card ANP/GNP-BC   06/02/2016  4:11 PM  This was a shared visit with Ned Card. Lynn Mccarthy was interviewed and examined. The etiology of the rash and nail bed erythema remains unclear. This is most likely a manifestation of the myeloproliferative disorder. We will check laboratory studies to look for evidence of a collagen vascular disorder. We will refer her to dermatology to consider a biopsy of the raised rash at the arms.  We will consider a trial of hydroxyurea if dermatology finds no explanation for the rash.  Julieanne Manson, M.D.

## 2016-06-03 ENCOUNTER — Other Ambulatory Visit: Payer: BC Managed Care – PPO

## 2016-06-03 DIAGNOSIS — D473 Essential (hemorrhagic) thrombocythemia: Secondary | ICD-10-CM

## 2016-06-03 LAB — SEDIMENTATION RATE: SED RATE: 6 mm/h (ref 0–32)

## 2016-06-04 LAB — ANTI-DNA ANTIBODY, DOUBLE-STRANDED: DSDNA AB: 8 [IU]/mL (ref 0–9)

## 2016-06-04 LAB — RHEUMATOID FACTOR

## 2016-06-05 ENCOUNTER — Telehealth: Payer: Self-pay | Admitting: *Deleted

## 2016-06-05 NOTE — Telephone Encounter (Signed)
  Telephone call to patient- she does not have any interest in starting a medication until the rash origin is identified. Pt would like to wait until after dermatology appointment. This has not been scheduled yet.  I have contacted HIM in regards to referral. Waiting for a response.          Message  Received: Today  Message Contents  Owens Shark, NP  Belva Chimes, LPN        Please let her know lupus tests are negative, Dr. Benay Spice thinks we need to consider hydrea. Does she have an appointment with dermatology yet?

## 2016-06-05 NOTE — Telephone Encounter (Signed)
Left message for patient to return call.

## 2016-06-05 NOTE — Telephone Encounter (Signed)
-----   Message from Owens Shark, NP sent at 06/05/2016  8:46 AM EDT ----- Please let her know lupus tests are negative, Dr. Benay Spice thinks we need to consider hydrea. Does she have an appointment with dermatology yet?

## 2016-06-10 ENCOUNTER — Telehealth: Payer: Self-pay | Admitting: *Deleted

## 2016-06-10 ENCOUNTER — Telehealth: Payer: Self-pay | Admitting: Oncology

## 2016-06-10 NOTE — Telephone Encounter (Signed)
Follow up on referral to dermatology. Patient called and made an appt with [facility] herself. Next available appointment is 7/23 at 1020am. Patient is questioning if this is acceptable since it is so far out. Patient returns to vascular on 7/5 and [place] on 7/10. Status reported to Ned Card for review.

## 2016-06-10 NOTE — Telephone Encounter (Signed)
Left message on patient's voicemail regarding her Dermatology appointment with Dr Harriett Sine. Patient is an established patient of Dr Elvera Lennox. Appointment scheduled for next available, which is on  08/03/16 @ 10:20 a.m.   [place], [place], [place]. [address]. Tel [phone]

## 2016-07-16 ENCOUNTER — Encounter (HOSPITAL_COMMUNITY): Payer: BC Managed Care – PPO

## 2016-07-16 ENCOUNTER — Encounter: Payer: BC Managed Care – PPO | Admitting: Vascular Surgery

## 2016-07-21 ENCOUNTER — Ambulatory Visit: Payer: BC Managed Care – PPO | Admitting: Oncology

## 2016-07-21 ENCOUNTER — Other Ambulatory Visit: Payer: BC Managed Care – PPO

## 2016-09-17 ENCOUNTER — Other Ambulatory Visit: Payer: Self-pay | Admitting: Obstetrics and Gynecology

## 2016-09-17 DIAGNOSIS — Z1231 Encounter for screening mammogram for malignant neoplasm of breast: Secondary | ICD-10-CM

## 2016-09-24 ENCOUNTER — Ambulatory Visit
Admission: RE | Admit: 2016-09-24 | Discharge: 2016-09-24 | Disposition: A | Discharge status: Home / Self Care | Payer: BC Managed Care – PPO | Source: Ambulatory Visit | Attending: Obstetrics and Gynecology | Admitting: Obstetrics and Gynecology

## 2016-09-24 ENCOUNTER — Ambulatory Visit: Payer: BC Managed Care – PPO

## 2016-09-24 DIAGNOSIS — Z1231 Encounter for screening mammogram for malignant neoplasm of breast: Secondary | ICD-10-CM

## 2018-03-03 ENCOUNTER — Other Ambulatory Visit: Payer: Self-pay | Admitting: Obstetrics and Gynecology

## 2018-03-03 DIAGNOSIS — Z1231 Encounter for screening mammogram for malignant neoplasm of breast: Secondary | ICD-10-CM

## 2018-03-04 ENCOUNTER — Ambulatory Visit
Admission: RE | Admit: 2018-03-04 | Discharge: 2018-03-04 | Disposition: A | Discharge status: Home / Self Care | Payer: BC Managed Care – PPO | Source: Ambulatory Visit | Attending: Obstetrics and Gynecology | Admitting: Obstetrics and Gynecology

## 2018-03-04 ENCOUNTER — Other Ambulatory Visit: Payer: Self-pay | Admitting: Obstetrics and Gynecology

## 2018-03-04 DIAGNOSIS — Z1231 Encounter for screening mammogram for malignant neoplasm of breast: Secondary | ICD-10-CM

## 2018-03-04 DIAGNOSIS — N63 Unspecified lump in unspecified breast: Secondary | ICD-10-CM

## 2018-03-11 ENCOUNTER — Other Ambulatory Visit: Payer: Self-pay | Admitting: Obstetrics and Gynecology

## 2018-03-11 ENCOUNTER — Ambulatory Visit
Admission: RE | Admit: 2018-03-11 | Discharge: 2018-03-11 | Disposition: A | Discharge status: Home / Self Care | Payer: BC Managed Care – PPO | Source: Ambulatory Visit | Attending: Obstetrics and Gynecology | Admitting: Obstetrics and Gynecology

## 2018-03-11 DIAGNOSIS — N63 Unspecified lump in unspecified breast: Secondary | ICD-10-CM

## 2018-03-16 ENCOUNTER — Ambulatory Visit
Admission: RE | Admit: 2018-03-16 | Discharge: 2018-03-16 | Disposition: A | Discharge status: Home / Self Care | Payer: BC Managed Care – PPO | Source: Ambulatory Visit | Attending: Obstetrics and Gynecology | Admitting: Obstetrics and Gynecology

## 2018-03-16 ENCOUNTER — Other Ambulatory Visit: Payer: Self-pay | Admitting: Obstetrics and Gynecology

## 2018-03-16 DIAGNOSIS — N6001 Solitary cyst of right breast: Secondary | ICD-10-CM

## 2018-03-16 DIAGNOSIS — N63 Unspecified lump in unspecified breast: Secondary | ICD-10-CM

## 2018-03-21 ENCOUNTER — Other Ambulatory Visit: Payer: Self-pay | Admitting: Obstetrics and Gynecology

## 2018-12-05 ENCOUNTER — Other Ambulatory Visit: Payer: Self-pay

## 2018-12-05 DIAGNOSIS — Z20822 Contact with and (suspected) exposure to covid-19: Secondary | ICD-10-CM

## 2018-12-06 LAB — NOVEL CORONAVIRUS, NAA: SARS-CoV-2, NAA: NOT DETECTED

## 2019-01-01 ENCOUNTER — Telehealth: Payer: BC Managed Care – PPO | Admitting: Family

## 2019-01-01 DIAGNOSIS — Z20822 Contact with and (suspected) exposure to covid-19: Secondary | ICD-10-CM

## 2019-01-01 MED ORDER — BENZONATATE 100 MG PO CAPS: 100.0000 mg | ORAL_CAPSULE | Freq: Three times a day (TID) | ORAL | 0 refills | Status: DC | PRN

## 2019-01-01 NOTE — Progress Notes (Signed)
E-Visit for Corona Virus Screening   Your current symptoms could be consistent with the coronavirus.  Many health care providers can now test patients at their office but not all are.  [facility] has multiple testing sites. For information on our [place] testing locations and hours go to [email or web address]  We are enrolling you in our [place] for [place] . Daily you will receive a questionnaire within the [place] website. Our COVID 19 response team will be monitoring your responses daily.  Testing Information: The COVID-19 Community Testing sites will begin testing BY APPOINTMENT ONLY.  You can schedule online at [email or web address]  If you do not have access to a smart phone or computer you may call [phone] for an appointment.  Testing Locations: Appointment schedule is 8 am to 3:30 pm at all sites  [facility] indoors at [address], [place] [address] [facility]  indoors at [place]. [address], [place], [place] [address] [place] indoors at [address], [place] [address]  Additional testing sites in the Community:  . For [facility] Testing sites in [facility]  FaceUpdate.uy  . For Pop-up testing sites in [place]  BowlDirectory.co.uk  . For Testing sites with regular hours https://onsms.org/[email or web address]  . For [place] [email or web address]  . For [facility] [email or web address]  . For [facility] testing in [place] and [employer] [email or web address]  . For [facility] testing in [facility]   [email or web address].care/[email or web address]  For  more  information about community testing call [phone]   We are enrolling you in our [place] for [place] . Daily you will receive a questionnaire within the [place] website. Our COVID 19 response team will be monitoring your responses daily.  Please quarantine yourself while awaiting your test results. If you develop fever/cough/breathlessness, please stay home for 10 days with improving symptoms and until you have had 24 hours of no fever (without taking a fever reducer).  You should wear a mask or cloth face covering over your nose and mouth if you must be around other people or animals, including pets (even at home). Try to stay at least 6 feet away from other people. This will protect the people around you.  Please continue good preventive care measures, including:  frequent hand-washing, avoid touching your face, cover coughs/sneezes, stay out of crowds and keep a 6 foot distance from others.  COVID-19 is a respiratory illness with symptoms that are similar to the flu. Symptoms are typically mild to moderate, but there have been cases of severe illness and death due to the virus.   The following symptoms may appear 2-14 days after exposure: . Fever . Cough . Shortness of breath or difficulty breathing . Chills . Repeated shaking with chills . Muscle pain . Headache . Sore throat . New loss of taste or smell . Fatigue . Congestion or runny nose . Nausea or vomiting . Diarrhea  Go to the nearest hospital ED for assessment if fever/cough/breathlessness are severe or illness seems like a threat to life.  It is vitally important that if you feel that you have an infection such as this virus or any other virus that you stay home and away from places where you may spread it to others.  You should avoid contact with people age 47 and older.   You can use medication such as A prescription cough medication called Tessalon Perles 100 mg. You may take 1-2 capsules every 8 hours as  needed for cough.  You may also take acetaminophen (Tylenol) as needed for fever.  Reduce your risk of any infection by using the same precautions used for avoiding the common cold or flu:  Marland Kitchen Wash your hands often with soap and warm water for at least 20 seconds.  If soap and water are not readily available, use an alcohol-based hand sanitizer with at least 60% alcohol.  . If coughing or sneezing, cover your mouth and nose by coughing or sneezing into the elbow areas of your shirt or coat, into a tissue or into your sleeve (not your hands). . Avoid shaking hands with others and consider head nods or verbal greetings only. . Avoid touching your eyes, nose, or mouth with unwashed hands.  . Avoid close contact with people who are sick. . Avoid places or events with large numbers of people in one location, like concerts or sporting events. . Carefully consider travel plans you have or are making. . If you are planning any travel outside or inside the [place], visit the CDC's Travelers' Health webpage for the latest health notices. . If you have some symptoms but not all symptoms, continue to monitor at home and seek medical attention if your symptoms worsen. . If you are having a medical emergency, call 911.  HOME CARE . Only take medications as instructed by your medical team. . Drink plenty of fluids and get plenty of rest. . A steam or ultrasonic humidifier can help if you have congestion.   GET HELP RIGHT AWAY IF YOU HAVE EMERGENCY WARNING SIGNS** FOR COVID-19. If you or someone is showing any of these signs seek emergency medical care immediately. Call 911 or proceed to your closest emergency facility if: . You develop worsening high fever. . Trouble breathing . Bluish lips or face . Persistent pain or pressure in the chest . New confusion . Inability to wake or stay awake . You cough up blood. . Your symptoms become more severe  **This list is not all possible symptoms. Contact your  medical provider for any symptoms that are sever or concerning to you.  MAKE SURE YOU   Understand these instructions.  Will watch your condition.  Will get help right away if you are not doing well or get worse.  Your e-visit answers were reviewed by a board certified advanced clinical practitioner to complete your personal care plan.  Depending on the condition, your plan could have included both over the counter or prescription medications.  If there is a problem please reply once you have received a response from your provider.  Your safety is important to [place].  If you have drug allergies check your prescription carefully.    You can use MyChart to ask questions about today's visit, request a non-urgent call back, or ask for a work or school excuse for 24 hours related to this e-Visit. If it has been greater than 24 hours you will need to follow up with your provider, or enter a new e-Visit to address those concerns. You will get an e-mail in the next two days asking about your experience.  I hope that your e-visit has been valuable and will speed your recovery. Thank you for using e-visits.  Approximately 5 minutes was spent documenting and reviewing patient's chart.

## 2019-04-12 NOTE — Telephone Encounter (Signed)
discard

## 2019-05-01 ENCOUNTER — Telehealth: Payer: Self-pay | Admitting: *Deleted

## 2019-05-01 DIAGNOSIS — D473 Essential (hemorrhagic) thrombocythemia: Secondary | ICD-10-CM

## 2019-05-01 NOTE — Telephone Encounter (Signed)
Notified patient that Dr. Benay Spice has reviewed labs sent by [facility]. She needs to be seen here with labs in next 2-3 weeks. She understands and agrees. Scheduling message sent. Confirmed she is still taking her hemocyte daily.

## 2019-05-02 ENCOUNTER — Telehealth: Payer: Self-pay | Admitting: Oncology

## 2019-05-02 NOTE — Telephone Encounter (Signed)
Scheduled appt per 4/19 sch message-unable to reach pt . Left message with appt date and time

## 2019-05-19 ENCOUNTER — Ambulatory Visit: Payer: BC Managed Care – PPO | Admitting: Oncology

## 2019-05-19 ENCOUNTER — Inpatient Hospital Stay: Payer: BC Managed Care – PPO | Admitting: Oncology

## 2019-05-19 ENCOUNTER — Other Ambulatory Visit: Payer: Self-pay

## 2019-05-19 ENCOUNTER — Inpatient Hospital Stay: Payer: BC Managed Care – PPO

## 2019-05-19 ENCOUNTER — Telehealth: Payer: Self-pay | Admitting: Oncology

## 2019-05-19 ENCOUNTER — Other Ambulatory Visit: Payer: BC Managed Care – PPO

## 2019-05-19 ENCOUNTER — Inpatient Hospital Stay: Payer: BC Managed Care – PPO | Attending: Oncology

## 2019-05-19 VITALS — BP 127/82 | HR 96 | Temp 97.3°F | Resp 18 | Ht 65.5 in | Wt 147.6 lb

## 2019-05-19 DIAGNOSIS — R21 Rash and other nonspecific skin eruption: Secondary | ICD-10-CM | POA: Insufficient documentation

## 2019-05-19 DIAGNOSIS — D473 Essential (hemorrhagic) thrombocythemia: Secondary | ICD-10-CM

## 2019-05-19 DIAGNOSIS — M898X9 Other specified disorders of bone, unspecified site: Secondary | ICD-10-CM | POA: Diagnosis not present

## 2019-05-19 DIAGNOSIS — D509 Iron deficiency anemia, unspecified: Secondary | ICD-10-CM | POA: Diagnosis not present

## 2019-05-19 LAB — CBC WITH DIFFERENTIAL (CANCER CENTER ONLY)
Abs Immature Granulocytes: 3.23 10*3/uL — ABNORMAL HIGH (ref 0.00–0.07)
Basophils Absolute: 0.1 10*3/uL (ref 0.0–0.1)
Basophils Relative: 1 %
Eosinophils Absolute: 0.1 10*3/uL (ref 0.0–0.5)
Eosinophils Relative: 0 %
HCT: 35.6 % — ABNORMAL LOW (ref 36.0–46.0)
Hemoglobin: 10.3 g/dL — ABNORMAL LOW (ref 12.0–15.0)
Immature Granulocytes: 20 %
Lymphocytes Relative: 7 %
Lymphs Abs: 1 10*3/uL (ref 0.7–4.0)
MCH: 24.4 pg — ABNORMAL LOW (ref 26.0–34.0)
MCHC: 28.9 g/dL — ABNORMAL LOW (ref 30.0–36.0)
MCV: 84.4 fL (ref 80.0–100.0)
Monocytes Absolute: 0.9 10*3/uL (ref 0.1–1.0)
Monocytes Relative: 6 %
Neutro Abs: 10.5 10*3/uL — ABNORMAL HIGH (ref 1.7–7.7)
Neutrophils Relative %: 66 %
Platelet Count: 326 10*3/uL (ref 150–400)
RBC: 4.22 MIL/uL (ref 3.87–5.11)
RDW: 19.2 % — ABNORMAL HIGH (ref 11.5–15.5)
WBC Count: 15.8 10*3/uL — ABNORMAL HIGH (ref 4.0–10.5)
nRBC: 0.8 % — ABNORMAL HIGH (ref 0.0–0.2)

## 2019-05-19 LAB — CMP (CANCER CENTER ONLY)
ALT: 16 U/L (ref 0–44)
AST: 28 U/L (ref 15–41)
Albumin: 4.4 g/dL (ref 3.5–5.0)
Alkaline Phosphatase: 60 U/L (ref 38–126)
Anion gap: 12 (ref 5–15)
BUN: 15 mg/dL (ref 6–20)
CO2: 25 mmol/L (ref 22–32)
Calcium: 9 mg/dL (ref 8.9–10.3)
Chloride: 103 mmol/L (ref 98–111)
Creatinine: 0.74 mg/dL (ref 0.44–1.00)
GFR, Est AFR Am: >60 mL/min (ref >60)
GFR, Estimated: >60 mL/min (ref >60)
Glucose, Bld: 96 mg/dL (ref 70–99)
Potassium: 4.1 mmol/L (ref 3.5–5.1)
Sodium: 140 mmol/L (ref 135–145)
Total Bilirubin: 1.5 mg/dL — ABNORMAL HIGH (ref 0.3–1.2)
Total Protein: 7.1 g/dL (ref 6.5–8.1)

## 2019-05-19 LAB — LACTATE DEHYDROGENASE: LDH: 909 U/L — ABNORMAL HIGH (ref 98–192)

## 2019-05-19 LAB — SAVE SMEAR(SSMR), FOR PROVIDER SLIDE REVIEW

## 2019-05-19 NOTE — Progress Notes (Signed)
  [facility] OFFICE PROGRESS NOTE   Diagnosis: Essential thrombocytosis  INTERVAL HISTORY:   [name] was last seen in the hematology clinic 3 years ago.  She has been followed by her primary provider.  She continues to have a rash over the legs and arms.  She reports undergoing an evaluation by dermatology that was nondiagnostic.  She has pain at the lower legs and ankles.  She has sweats at night.  Good appetite.  The spleen remains enlarged. She has received the COVID-19 vaccine. She reports intentional weight loss with a change in her diet. She recently saw her primary provider.  A CBC on 04/27/2019 returned with a hemoglobin of 9.7, MCV 78.8, platelets 372,000, white count 17.4 and neutrophil count 15.4.  The ferritin returned at 6.4.  She reports that she had not been taking her regular iron supplement when the CBC was obtained in April.  She resumed iron therapy several weeks ago. Objective:  Vital signs in last 24 hours:  Blood pressure 127/82, pulse 96, temperature (!) 97.3 F (36.3 C), temperature source Oral, resp. rate 18, height 5' 5.5" (1.664 m), weight 147 lb 9.6 oz (67 kg), SpO2 100 %.    Lymphatics: No cervical, supraclavicular, axillary, or inguinal nodes Resp: Lungs clear bilaterally Cardio: Regular rate and rhythm GI: No hepatomegaly, the spleen is palpable in the left mid abdomen extending to the level of the umbilicus Vascular: No leg edema  Skin: Erythematous dry confluent slightly raised areas over the posterior upper arms and thighs   Lab Results:  Lab Results  Component Value Date   WBC 15.8 (H) 05/19/2019   HGB 10.3 (L) 05/19/2019   HCT 35.6 (L) 05/19/2019   MCV 84.4 05/19/2019   PLT 326 05/19/2019   NEUTROABS PENDING 05/19/2019   Blood smear: The majority of the white blood cells are mature neutrophils.  There are a few band forms and myelocytes.  No blasts.  The platelets are normal in number with large platelets present.  There  are numerous ovalocytes and teardrop forms.  Few nucleated red cells.  Medications: I have reviewed the patient's current medications.   Assessment/Plan: 1. Essential thrombocytosis. The platelet count is in the normal range. Stable splenomegaly.  Bone marrow biopsy 06/28/2001-hypercellular marrow with a chronic myeloproliferative disorder, absent iron stores, marked increase in megakaryocytes, reticulin stain showed mild fibrosis and trichrome stain was negative for collagenous fibrosis 2. Microcytic anemia. Improved with iron therapy 3. Rash 4. Bone pain    Disposition: Lynn Mccarthy has a longstanding history of a myeloproliferative disorder, characterized as essential thrombocytosis.  She has chronic iron deficiency anemia, likely secondary to the myeloproliferative disorder.  The rash and bone pain may be related to the myeloproliferative disorder.  We will check a myeloproliferative panel to look for a JAK2 mutation.  She may benefit from a trial of a JAK2 inhibitor.  She may be developing myelofibrosis.   She will continue iron.  Lynn Mccarthy will return for an office and lab visit in approximately 3 weeks.    Betsy Coder, MD  05/19/2019  8:45 AM

## 2019-05-19 NOTE — Telephone Encounter (Signed)
Scheduled appt per 5/7 los - pt aware of appt date and time

## 2019-05-31 ENCOUNTER — Telehealth: Payer: Self-pay | Admitting: *Deleted

## 2019-05-31 NOTE — Telephone Encounter (Addendum)
Left VM for patient to return call for her lab results. Notified that per Dr. 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 Will discuss treatment w/Jak-2 inhibitor when she is seen next week in office. LDH elevation is common in this situation. Will be able to monitor her response to treatment with improvement in anemia, spleen getting smaller and rash resolution.

## 2019-06-05 ENCOUNTER — Inpatient Hospital Stay: Payer: BC Managed Care – PPO

## 2019-06-05 ENCOUNTER — Encounter: Payer: Self-pay | Admitting: Nurse Practitioner

## 2019-06-05 ENCOUNTER — Other Ambulatory Visit: Payer: Self-pay

## 2019-06-05 ENCOUNTER — Telehealth: Payer: Self-pay | Admitting: Oncology

## 2019-06-05 ENCOUNTER — Inpatient Hospital Stay: Payer: BC Managed Care – PPO | Admitting: Nurse Practitioner

## 2019-06-05 VITALS — BP 122/75 | HR 81 | Temp 97.8°F | Ht 65.0 in | Wt 146.7 lb

## 2019-06-05 DIAGNOSIS — D473 Essential (hemorrhagic) thrombocythemia: Secondary | ICD-10-CM

## 2019-06-05 LAB — CBC WITH DIFFERENTIAL (CANCER CENTER ONLY)
Abs Immature Granulocytes: 4.25 10*3/uL — ABNORMAL HIGH (ref 0.00–0.07)
Basophils Absolute: 0.1 10*3/uL (ref 0.0–0.1)
Basophils Relative: 1 %
Eosinophils Absolute: 0.1 10*3/uL (ref 0.0–0.5)
Eosinophils Relative: 0 %
HCT: 36.3 % (ref 36.0–46.0)
Hemoglobin: 10.3 g/dL — ABNORMAL LOW (ref 12.0–15.0)
Immature Granulocytes: 20 %
Lymphocytes Relative: 6 %
Lymphs Abs: 1.3 10*3/uL (ref 0.7–4.0)
MCH: 24 pg — ABNORMAL LOW (ref 26.0–34.0)
MCHC: 28.4 g/dL — ABNORMAL LOW (ref 30.0–36.0)
MCV: 84.6 fL (ref 80.0–100.0)
Monocytes Absolute: 1.2 10*3/uL — ABNORMAL HIGH (ref 0.1–1.0)
Monocytes Relative: 6 %
Neutro Abs: 14.5 10*3/uL — ABNORMAL HIGH (ref 1.7–7.7)
Neutrophils Relative %: 67 %
Platelet Count: 389 10*3/uL (ref 150–400)
RBC: 4.29 MIL/uL (ref 3.87–5.11)
RDW: 19.6 % — ABNORMAL HIGH (ref 11.5–15.5)
WBC Count: 21.4 10*3/uL — ABNORMAL HIGH (ref 4.0–10.5)
nRBC: 1.3 % — ABNORMAL HIGH (ref 0.0–0.2)

## 2019-06-05 NOTE — Progress Notes (Signed)
  [place] OFFICE PROGRESS NOTE   Diagnosis: Essential thrombocytosis  INTERVAL HISTORY:   Lynn Mccarthy returns as scheduled.  She continues to have a rash of the upper arms.  She has pain at the lower legs/ankles.  She continues to have night sweats.  She has abdominal discomfort from enlargement of the spleen.  She is more fatigued.  She has noted dryness/cracking of the skin on both feet.  Objective:  Vital signs in last 24 hours:  Blood pressure 122/75, pulse 81, temperature 97.8 F (36.6 C), temperature source Temporal, height '5\' 5"'$  (1.651 m), weight 146 lb 11.2 oz (66.5 kg), SpO2 100 %.    GI: Spleen palpable to the level of the umbilicus and just crossing midline. Vascular: No leg edema. Skin: Erythematous confluent rash posterior upper arms.   Lab Results:  Lab Results  Component Value Date   WBC 21.4 (H) 06/05/2019   HGB 10.3 (L) 06/05/2019   HCT 36.3 06/05/2019   MCV 84.6 06/05/2019   PLT 389 06/05/2019   NEUTROABS 14.5 (H) 06/05/2019    Imaging:  No results found.  Medications: I have reviewed the patient's current medications.  Assessment/Plan: 1. Essential thrombocytosis. The platelet count is in the normal range. Stable splenomegaly.  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 Microcytic anemia. Improved with iron therapy 3. Rash 4. Bone pain   Disposition: Lynn Mccarthy appears unchanged.  She has a long standing history of a myeloproliferative disorder/essential thrombocytosis.  She has a CALR mutation.  She is symptomatic with splenomegaly.  The rash and bone pain may be related as well.  Dr. Benay Spice recommends a trial of a Jak 2 inhibitor.  We specifically discussed Jakafi.  We reviewed potential  toxicities including bone marrow toxicity, bruising, increased serum cholesterol, hypertriglyceridemia, diarrhea, dizziness, headache, insomnia.  She was provided with printed information as well.  We will ask the [place] pharmacist to contact her to discuss further.  She will return for lab and follow-up in 3 weeks.  Patient seen with Dr. Benay Spice.    Ned Card ANP/GNP-BC   06/05/2019  4:30 PM This was a shared visit with Ned Card.  We discussed the CALR finding and treatment options with Lynn Mccarthy.  I suspect the bone pain, rash, night sweats, and malaise are related to the myeloproliferative disorder.   She will be referred for a bone marrow biopsy to assess for the extent of marrow fibrosis.  She will most likely begin a trial of a JAK2 inhibitor with the hope of improving her symptoms.  We will consider a transplant evaluation based on the bone marrow findings.  Julieanne Manson, MD

## 2019-06-05 NOTE — Telephone Encounter (Signed)
Scheduled per 5/24 los. Pt aware of appts. No avs or calendar needed.

## 2019-06-06 ENCOUNTER — Telehealth: Payer: Self-pay | Admitting: *Deleted

## 2019-06-06 ENCOUNTER — Encounter: Payer: Self-pay | Admitting: *Deleted

## 2019-06-06 ENCOUNTER — Other Ambulatory Visit: Payer: Self-pay | Admitting: Nurse Practitioner

## 2019-06-06 DIAGNOSIS — D473 Essential (hemorrhagic) thrombocythemia: Secondary | ICD-10-CM

## 2019-06-06 NOTE — Telephone Encounter (Addendum)
Reading office note of 5/24 on Mychart: mentioned referral for bone marrow biopsy and she thought Dr. Benay Spice said "no" to this and was not aware MD was considering a referral for transplant options. She would like to discuss this w/MD or NP. Also requesting a referral to [place] hematology at myeloproliferative disorder clinic she found in her research. Per Dr. Benay Spice: He plans to call her later today.

## 2019-06-07 ENCOUNTER — Encounter: Payer: Self-pay | Admitting: *Deleted

## 2019-06-07 ENCOUNTER — Other Ambulatory Visit: Payer: Self-pay | Admitting: Nurse Practitioner

## 2019-06-07 DIAGNOSIS — D473 Essential (hemorrhagic) thrombocythemia: Secondary | ICD-10-CM

## 2019-06-07 NOTE — Progress Notes (Signed)
Faxed referral, demographics and chart information to [facility] for Dr. Adriana Simas to see. Fax [phone]. Noted that her bone marrow biopsy is 06/20/19. Patient notified via Mychart message.

## 2019-06-15 DIAGNOSIS — D473 Essential (hemorrhagic) thrombocythemia: Principal | ICD-10-CM

## 2019-06-19 ENCOUNTER — Other Ambulatory Visit: Payer: Self-pay | Admitting: Radiology

## 2019-06-20 ENCOUNTER — Observation Stay (HOSPITAL_COMMUNITY)
Admission: RE | Admit: 2019-06-20 | Discharge: 2019-06-20 | Disposition: A | Discharge status: Home / Self Care | Payer: BC Managed Care – PPO | Source: Ambulatory Visit | Attending: Oncology | Admitting: Oncology

## 2019-06-20 ENCOUNTER — Ambulatory Visit (HOSPITAL_COMMUNITY)
Admission: RE | Admit: 2019-06-20 | Discharge: 2019-06-20 | Disposition: A | Discharge status: Home / Self Care | Payer: BC Managed Care – PPO | Source: Ambulatory Visit | Attending: Nurse Practitioner | Admitting: Nurse Practitioner

## 2019-06-20 ENCOUNTER — Encounter (HOSPITAL_COMMUNITY): Payer: Self-pay

## 2019-06-20 ENCOUNTER — Other Ambulatory Visit: Payer: Self-pay

## 2019-06-20 ENCOUNTER — Telehealth: Payer: Self-pay | Admitting: *Deleted

## 2019-06-20 ENCOUNTER — Encounter
Admit: 2019-06-20 | Discharge: 2019-06-20 | Payer: PRIVATE HEALTH INSURANCE | Attending: Internal Medicine | Primary: Internal Medicine

## 2019-06-20 DIAGNOSIS — Z825 Family history of asthma and other chronic lower respiratory diseases: Secondary | ICD-10-CM | POA: Insufficient documentation

## 2019-06-20 DIAGNOSIS — Z881 Allergy status to other antibiotic agents status: Secondary | ICD-10-CM | POA: Diagnosis not present

## 2019-06-20 DIAGNOSIS — Z811 Family history of alcohol abuse and dependence: Secondary | ICD-10-CM | POA: Insufficient documentation

## 2019-06-20 DIAGNOSIS — Z79899 Other long term (current) drug therapy: Secondary | ICD-10-CM | POA: Diagnosis not present

## 2019-06-20 DIAGNOSIS — Z833 Family history of diabetes mellitus: Secondary | ICD-10-CM | POA: Insufficient documentation

## 2019-06-20 DIAGNOSIS — Z803 Family history of malignant neoplasm of breast: Secondary | ICD-10-CM | POA: Diagnosis not present

## 2019-06-20 DIAGNOSIS — N6019 Diffuse cystic mastopathy of unspecified breast: Secondary | ICD-10-CM | POA: Diagnosis not present

## 2019-06-20 DIAGNOSIS — Z818 Family history of other mental and behavioral disorders: Secondary | ICD-10-CM | POA: Diagnosis not present

## 2019-06-20 DIAGNOSIS — D649 Anemia, unspecified: Secondary | ICD-10-CM | POA: Insufficient documentation

## 2019-06-20 DIAGNOSIS — Z8249 Family history of ischemic heart disease and other diseases of the circulatory system: Secondary | ICD-10-CM | POA: Diagnosis not present

## 2019-06-20 DIAGNOSIS — D473 Essential (hemorrhagic) thrombocythemia: Secondary | ICD-10-CM

## 2019-06-20 DIAGNOSIS — D72829 Elevated white blood cell count, unspecified: Secondary | ICD-10-CM | POA: Insufficient documentation

## 2019-06-20 DIAGNOSIS — Z85828 Personal history of other malignant neoplasm of skin: Secondary | ICD-10-CM | POA: Insufficient documentation

## 2019-06-20 LAB — CBC WITH DIFFERENTIAL/PLATELET
Abs Immature Granulocytes: 1.6 10*3/uL — ABNORMAL HIGH (ref 0.00–0.07)
Band Neutrophils: 8 %
Basophils Absolute: 0.3 10*3/uL — ABNORMAL HIGH (ref 0.0–0.1)
Basophils Relative: 2 %
Eosinophils Absolute: 0.1 10*3/uL (ref 0.0–0.5)
Eosinophils Relative: 1 %
HCT: 35 % — ABNORMAL LOW (ref 36.0–46.0)
Hemoglobin: 10 g/dL — ABNORMAL LOW (ref 12.0–15.0)
Lymphocytes Relative: 7 %
Lymphs Abs: 1 10*3/uL (ref 0.7–4.0)
MCH: 24.2 pg — ABNORMAL LOW (ref 26.0–34.0)
MCHC: 28.6 g/dL — ABNORMAL LOW (ref 30.0–36.0)
MCV: 84.5 fL (ref 80.0–100.0)
Metamyelocytes Relative: 3 %
Monocytes Absolute: 0.6 10*3/uL (ref 0.1–1.0)
Monocytes Relative: 4 %
Myelocytes: 7 %
Neutro Abs: 11 10*3/uL — ABNORMAL HIGH (ref 1.7–7.7)
Neutrophils Relative %: 66 %
Other: 1 %
Platelets: 272 10*3/uL (ref 150–400)
Promyelocytes Relative: 1 %
RBC: 4.14 MIL/uL (ref 3.87–5.11)
RDW: 18.9 % — ABNORMAL HIGH (ref 11.5–15.5)
WBC: 14.9 10*3/uL — ABNORMAL HIGH (ref 4.0–10.5)
nRBC: 0.4 % — ABNORMAL HIGH (ref 0.0–0.2)

## 2019-06-20 MED ORDER — SODIUM CHLORIDE 0.9 % IV SOLN: INTRAVENOUS | Status: DC

## 2019-06-20 MED ORDER — LIDOCAINE HCL (PF) 1 % IJ SOLN
INTRAMUSCULAR | Status: AC | PRN
  Administered 2019-06-20: 10 mL

## 2019-06-20 MED ORDER — MIDAZOLAM HCL 2 MG/2ML IJ SOLN
INTRAMUSCULAR | Status: AC | PRN
  Administered 2019-06-20 (×4): 1 mg via INTRAVENOUS

## 2019-06-20 MED ORDER — FENTANYL CITRATE (PF) 100 MCG/2ML IJ SOLN
INTRAMUSCULAR | Status: AC | PRN
  Administered 2019-06-20 (×2): 50 ug via INTRAVENOUS

## 2019-06-20 MED ORDER — MIDAZOLAM HCL 2 MG/2ML IJ SOLN
INTRAMUSCULAR | Status: AC
  Filled 2019-06-20: qty 6

## 2019-06-20 MED ORDER — HYDROCODONE-ACETAMINOPHEN 5-325 MG PO TABS: 1.0000 | ORAL_TABLET | ORAL | Status: DC | PRN

## 2019-06-20 MED ORDER — FENTANYL CITRATE (PF) 100 MCG/2ML IJ SOLN
INTRAMUSCULAR | Status: AC
  Filled 2019-06-20: qty 2

## 2019-06-20 NOTE — Procedures (Signed)
Interventional Radiology Procedure Note  Procedure: CT BM ASP AND CORE BX  Complications: None  Estimated Blood Loss: MIN  Findings: 11 G CORE AND

## 2019-06-20 NOTE — H&P (Signed)
Chief Complaint: Patient was seen in consultation today for bone marrow biopsy   Referring Physician(s): *Ned Card NP  Supervising Physician: Daryll Brod  Patient Status: [facility] - Out-pt  History of Present Illness: Lynn Mccarthy is a 49 y.o. female with hx of chronic essential thrombocytosis. She has had BM bx in the past. She now has developed rash and there is concern for more myeloproliferative disorder. She is referred for another bone marrow biopsy PMHx, meds, labs, imaging, allergies reviewed. Feels well, no recent fevers, chills, illness. Has been NPO today as directed.   Past Medical History:  Diagnosis Date  . Bacterial infection   . Basal cell carcinoma of skin 06/2011  . Candida vaginitis   . Fibrocystic breast   . History of chicken pox   . HPV (human papilloma virus) infection   . Ovarian cyst   . Thrombocytosis ([place])   . Vulvar inflammation   . Yeast infection     Past Surgical History:  Procedure Laterality Date  . BONE MARROW BIOPSY    . SHOULDER SURGERY  11/2009    Allergies: Doxycycline and Paxil [paroxetine hcl]  Medications: Prior to Admission medications   Medication Sig Start Date End Date Taking? Authorizing Provider  Ferrous Fumarate (HEMOCYTE PO) Take 1 tablet by mouth daily. Reported on 07/22/2015   Yes [provider]  Multiple Vitamin (MULTIVITAMIN) tablet Take 1 tablet by mouth daily.   Yes [provider]  Multiple Vitamins-Minerals (IMMUNE SYSTEM BOOSTER PO) Take 3 tablets by mouth daily.   Yes [provider]  sertraline (ZOLOFT) 50 MG tablet Take 50 mg by mouth daily. 05/10/19  Yes [provider]  fluticasone (FLONASE) 50 MCG/ACT nasal spray Place 1 spray into both nostrils daily.    [provider]     Family History  Problem Relation Age of Onset  . Heart disease Paternal Grandfather   . COPD Paternal Grandfather   . Alcohol abuse Paternal Grandfather   .  Hypertension Maternal Grandmother   . Diabetes Maternal Grandmother   . Cancer Maternal Grandmother        BREAST  . Breast cancer Maternal Grandmother 60  . Hypertension Mother   . Cancer Mother        BREAST  . Breast cancer Mother 79  . Depression Maternal Grandfather   . Alcohol abuse Paternal Aunt   . Alcohol abuse Father     Social History   Socioeconomic History  . Marital status: Married    Spouse name: Not on file  . Number of children: Not on file  . Years of education: Not on file  . Highest education level: Not on file  Occupational History  . Not on file  Tobacco Use  . Smoking status: Never Smoker  . Smokeless tobacco: Never Used  Substance and Sexual Activity  . Alcohol use: Yes  . Drug use: No  . Sexual activity: Yes    Birth control/protection: Surgical    Comment: VASECTOMY  Other Topics Concern  . Not on file  Social History Narrative  . Not on file   Social Determinants of Health   Financial Resource Strain:   . Difficulty of Paying Living Expenses:   Food Insecurity:   . Worried About Charity fundraiser in the Last Year:   . Arboriculturist in the Last Year:   Transportation Needs:   . Film/video editor (Medical):   Marland Kitchen Lack of Transportation (Non-Medical):   Physical  Activity:   . Days of Exercise per Week:   . Minutes of Exercise per Session:   Stress:   . Feeling of Stress :   Social Connections:   . Frequency of Communication with Friends and Family:   . Frequency of Social Gatherings with Friends and Family:   . Attends Religious Services:   . Active Member of Clubs or Organizations:   . Attends Archivist Meetings:   Marland Kitchen Marital Status:      Review of Systems: A 12 point ROS discussed and pertinent positives are indicated in the HPI above.  All other systems are negative.  Review of Systems  Vital Signs: BP 124/69   Pulse 79   Temp 98.4 F (36.9 C) (Oral)   Resp 18   Ht _0  (1.651 m)   Wt 64.9 kg   LMP  05/09/2019 (Approximate)   SpO2 100%   BMI 23.80 kg/m   Physical Exam Constitutional:      Appearance: Normal appearance. She is not ill-appearing.  HENT:     Mouth/Throat:     Mouth: Mucous membranes are moist.     Pharynx: Oropharynx is clear.  Cardiovascular:     Rate and Rhythm: Normal rate and regular rhythm.     Heart sounds: Normal heart sounds.  Pulmonary:     Effort: Pulmonary effort is normal.     Breath sounds: Normal breath sounds.  Skin:    General: Skin is warm and dry.  Neurological:     General: No focal deficit present.     Mental Status: She is alert and oriented to person, place, and time. Mental status is at baseline.  Psychiatric:        Mood and Affect: Mood normal.        Thought Content: Thought content normal.        Judgment: Judgment normal.      Labs:  CBC: Recent Labs    05/19/19 0808 06/05/19 1508  WBC 15.8* 21.4*  HGB 10.3* 10.3*  HCT 35.6* 36.3  PLT 326 389    COAGS: No results for input(s): INR, APTT in the last 8760 hours.  BMP: Recent Labs    05/19/19 0920  NA 140  K 4.1  CL 103  CO2 25  GLUCOSE 96  BUN 15  CALCIUM 9.0  CREATININE 0.74  GFRNONAA >60  GFRAA >60    LIVER FUNCTION TESTS: Recent Labs    05/19/19 0920  BILITOT 1.5*  AST 28  ALT 16  ALKPHOS 60  PROT 7.1  ALBUMIN 4.4    TUMOR MARKERS: No results for input(s): AFPTM, CEA, CA199, CHROMGRNA in the last 8760 hours.  Assessment and Plan: Thrombocytosis For CT guided bone marrow biopsy. Risks and benefits of bone marrow bx was discussed with the patient and/or patient's family including, but not limited to bleeding, infection, damage to adjacent structures or low yield requiring additional tests.  All of the questions were answered and there is agreement to proceed.  Consent signed and in chart.    Thank you for this interesting consult.  I greatly enjoyed meeting Gwenyth Ober and look forward to participating in their care.  A  copy of this report was sent to the requesting provider on this date.  Electronically Signed: Ascencion Dike, PA-C 06/20/2019, 8:37 AM   I spent a total of 20 minutes in face to face in clinical consultation, greater than 50% of which was counseling/coordinating care for BM bx

## 2019-06-20 NOTE — Telephone Encounter (Signed)
Patient asking if she should cancel her OV here on 6/15, since she is being seen on 6/22 at [facility]. Also asking if she will be called about her bone marrow results (just done today). Per Dr. Benay Spice: OK to move out till after visit at [facility] and he will call her w/results when available. Scheduling message sent.

## 2019-06-20 NOTE — Discharge Instructions (Signed)
Bone Marrow Aspiration and Bone Marrow Biopsy, Adult, Care After This sheet gives you information about how to care for yourself after your procedure. Your health care provider may also give you more specific instructions. If you have problems or questions, contact your health care provider. What can I expect after the procedure? After the procedure, it is common to have:  Mild pain and tenderness.  Swelling.  Bruising. Follow these instructions at home: Puncture site care   Follow instructions from your health care provider about how to take care of the puncture site. Make sure you: ? Wash your hands with soap and water before and after you change your bandage (dressing). If soap and water are not available, use hand sanitizer. ? Change your dressing as told by your health care provider.  Check your puncture site every day for signs of infection. Check for: ? More redness, swelling, or pain. ? Fluid or blood. ? Warmth. ? Pus or a bad smell. Activity  Return to your normal activities as told by your health care provider. Ask your health care provider what activities are safe for you.  Do not lift anything that is heavier than 10 lb (4.5 kg), or the limit that you are told, until your health care provider says that it is safe.  Do not drive for 24 hours if you were given a sedative during your procedure. General instructions   Take over-the-counter and prescription medicines only as told by your health care provider.  Do not take baths, swim, or use a hot tub until your health care provider approves. Ask your health care provider if you may take showers. You may only be allowed to take sponge baths.  If directed, put ice on the affected area. To do this: ? Put ice in a plastic bag. ? Place a towel between your skin and the bag. ? Leave the ice on for 20 minutes, 2-3 times a day.  Keep all follow-up visits as told by your health care provider. This is important. Contact a  health care provider if:  Your pain is not controlled with medicine.  You have a fever.  You have more redness, swelling, or pain around the puncture site.  You have fluid or blood coming from the puncture site.  Your puncture site feels warm to the touch.  You have pus or a bad smell coming from the puncture site. Summary  After the procedure, it is common to have mild pain, tenderness, swelling, and bruising.  Follow instructions from your health care provider about how to take care of the puncture site and what activities are safe for you.  Take over-the-counter and prescription medicines only as told by your health care provider.  Contact a health care provider if you have any signs of infection, such as fluid or blood coming from the puncture site. This information is not intended to replace advice given to you by your health care provider. Make sure you discuss any questions you have with your health care provider. Document Revised: 05/17/2018 Document Reviewed: 05/17/2018 Elsevier Patient Education  2020 Elsevier Inc. Moderate Conscious Sedation, Adult, Care After These instructions provide you with information about caring for yourself after your procedure. Your health care provider may also give you more specific instructions. Your treatment has been planned according to current medical practices, but problems sometimes occur. Call your health care provider if you have any problems or questions after your procedure. What can I expect after the procedure? After your procedure,   it is common:  To feel sleepy for several hours.  To feel clumsy and have poor balance for several hours.  To have poor judgment for several hours.  To vomit if you eat too soon. Follow these instructions at home: For at least 24 hours after the procedure:   Do not: ? Participate in activities where you could fall or become injured. ? Drive. ? Use heavy machinery. ? Drink alcohol. ? Take  sleeping pills or medicines that cause drowsiness. ? Make important decisions or sign legal documents. ? Take care of children on your own.  Rest. Eating and drinking  Follow the diet recommended by your health care provider.  If you vomit: ? Drink water, juice, or soup when you can drink without vomiting. ? Make sure you have little or no nausea before eating solid foods. General instructions  Have a responsible adult stay with you until you are awake and alert.  Take over-the-counter and prescription medicines only as told by your health care provider.  If you smoke, do not smoke without supervision.  Keep all follow-up visits as told by your health care provider. This is important. Contact a health care provider if:  You keep feeling nauseous or you keep vomiting.  You feel light-headed.  You develop a rash.  You have a fever. Get help right away if:  You have trouble breathing. This information is not intended to replace advice given to you by your health care provider. Make sure you discuss any questions you have with your health care provider. Document Revised: 12/11/2016 Document Reviewed: 04/20/2015 Elsevier Patient Education  2020 Elsevier Inc.  

## 2019-06-21 ENCOUNTER — Telehealth: Payer: Self-pay | Admitting: Oncology

## 2019-06-21 NOTE — Telephone Encounter (Signed)
Pt stated that she will call back to r/s appt with f/u. Pt was unsure at the moment to schedule.

## 2019-06-22 ENCOUNTER — Telehealth: Payer: Self-pay

## 2019-06-22 LAB — MPN-ET/MYELOFIBROSIS (JAK2 V617F-MPL515-CALR)

## 2019-06-22 NOTE — Telephone Encounter (Signed)
VM received from [place] at [facility]. Informed MD Sherrill's office that this patient is scheduled to see Dr. Evelene Croon at [facility] on 6/22. Copy of bone marrow bx report done on 6/8 faxed to [phone] as requested.

## 2019-06-23 ENCOUNTER — Telehealth: Payer: Self-pay | Admitting: *Deleted

## 2019-06-23 NOTE — Telephone Encounter (Signed)
Notified patient of Dr. Gearldine Shown message regarding results and to f/u with [facility] and then here. She is asking if the results show she has myelofibrosis?

## 2019-06-23 NOTE — Telephone Encounter (Signed)
-----   Message from Ladell Pier, MD sent at 06/23/2019  8:11 AM EDT ----- Please call patient, bone marrow consistent with her chronic history of a myeloproliferative disorder, no evidence of conversion to leukemia  Follow-up as scheduled at [facility] and then here

## 2019-06-26 ENCOUNTER — Encounter: Payer: Self-pay | Admitting: *Deleted

## 2019-06-26 DIAGNOSIS — D473 Essential (hemorrhagic) thrombocythemia: Principal | ICD-10-CM

## 2019-06-27 ENCOUNTER — Other Ambulatory Visit: Payer: BC Managed Care – PPO

## 2019-06-27 ENCOUNTER — Ambulatory Visit: Payer: BC Managed Care – PPO | Admitting: Oncology

## 2019-06-29 ENCOUNTER — Encounter (HOSPITAL_COMMUNITY): Payer: Self-pay | Admitting: Oncology

## 2019-06-29 ENCOUNTER — Telehealth: Payer: Self-pay | Admitting: *Deleted

## 2019-06-29 NOTE — Telephone Encounter (Signed)
Notified patient that the final stain results are in as well as cytogenetics on her BMBX. Faxed to Dr. Delman Cheadle as she requested, but having difficulty getting it to go to [facility]. HIM department is trying to send now. Suggested she come to [facility] sometime before her visit on 6/22 to pick up a hardcopy of all the results. Left at front desk for pick up.

## 2019-06-30 LAB — SURGICAL PATHOLOGY

## 2019-07-04 ENCOUNTER — Other Ambulatory Visit: Admit: 2019-07-04 | Discharge: 2019-07-05 | Payer: PRIVATE HEALTH INSURANCE

## 2019-07-04 ENCOUNTER — Encounter
Admit: 2019-07-04 | Discharge: 2019-07-05 | Payer: PRIVATE HEALTH INSURANCE | Attending: Internal Medicine | Primary: Internal Medicine

## 2019-07-04 DIAGNOSIS — D7581 Myelofibrosis: Principal | ICD-10-CM

## 2019-07-04 DIAGNOSIS — N3010 Interstitial cystitis (chronic) without hematuria: Principal | ICD-10-CM

## 2019-07-04 DIAGNOSIS — D473 Essential (hemorrhagic) thrombocythemia: Principal | ICD-10-CM

## 2019-07-21 ENCOUNTER — Other Ambulatory Visit: Payer: Self-pay | Admitting: Obstetrics and Gynecology

## 2019-07-21 DIAGNOSIS — Z1231 Encounter for screening mammogram for malignant neoplasm of breast: Secondary | ICD-10-CM

## 2019-07-26 ENCOUNTER — Ambulatory Visit
Admission: RE | Admit: 2019-07-26 | Discharge: 2019-07-26 | Disposition: A | Discharge status: Home / Self Care | Payer: BC Managed Care – PPO | Source: Ambulatory Visit | Attending: Obstetrics and Gynecology | Admitting: Obstetrics and Gynecology

## 2019-07-26 DIAGNOSIS — Z1231 Encounter for screening mammogram for malignant neoplasm of breast: Secondary | ICD-10-CM

## 2019-07-28 DIAGNOSIS — Z7682 Awaiting organ transplant status: Principal | ICD-10-CM

## 2019-08-02 ENCOUNTER — Encounter: Admit: 2019-08-02 | Discharge: 2019-08-03 | Payer: PRIVATE HEALTH INSURANCE

## 2019-09-05 MED ORDER — CYCLOBENZAPRINE 5 MG TABLET
0 days
Start: 2019-09-05 — End: 2019-11-27

## 2019-09-25 ENCOUNTER — Telehealth: Payer: Self-pay | Admitting: *Deleted

## 2019-09-25 NOTE — Telephone Encounter (Addendum)
Called to report she has been in significant sharp pain in area of her spleen (LUQ abdomen) ever since a "rolfing" movement was done in therapy when going from stomach to turning on her side on 09/20/19. She has not informed the therapist of the pain. Was being seen for back pain by therapy (sent per her PCP). She is asking to be seen by Dr. Benay Spice or his NP for evaluation of this pain and possibly lab work. Asking to be seen 1st available.  Per our PT department, this maneuver is not done in the [facility] system and can be painful. Per Dr. Benay Spice: Can see her 9/16 at 0830 or NP at 1513. Appointment scheduled for 1515 per patient choice. MD does not feel labs are necessary.

## 2019-09-28 ENCOUNTER — Encounter: Payer: Self-pay | Admitting: Nurse Practitioner

## 2019-09-28 ENCOUNTER — Other Ambulatory Visit: Payer: Self-pay

## 2019-09-28 ENCOUNTER — Inpatient Hospital Stay: Payer: BC Managed Care – PPO | Attending: Nurse Practitioner | Admitting: Nurse Practitioner

## 2019-09-28 ENCOUNTER — Inpatient Hospital Stay: Payer: BC Managed Care – PPO

## 2019-09-28 VITALS — BP 125/81 | HR 82 | Temp 97.9°F | Resp 16 | Ht 65.0 in | Wt 154.0 lb

## 2019-09-28 DIAGNOSIS — D473 Essential (hemorrhagic) thrombocythemia: Secondary | ICD-10-CM | POA: Insufficient documentation

## 2019-09-28 DIAGNOSIS — D509 Iron deficiency anemia, unspecified: Secondary | ICD-10-CM | POA: Diagnosis not present

## 2019-09-28 DIAGNOSIS — D471 Chronic myeloproliferative disease: Secondary | ICD-10-CM | POA: Insufficient documentation

## 2019-09-28 DIAGNOSIS — R161 Splenomegaly, not elsewhere classified: Secondary | ICD-10-CM | POA: Insufficient documentation

## 2019-09-28 DIAGNOSIS — R1012 Left upper quadrant pain: Secondary | ICD-10-CM | POA: Diagnosis not present

## 2019-09-28 LAB — CBC WITH DIFFERENTIAL (CANCER CENTER ONLY)
Abs Immature Granulocytes: 4.1 10*3/uL — ABNORMAL HIGH (ref 0.00–0.07)
Band Neutrophils: 7 %
Basophils Absolute: 0.4 10*3/uL — ABNORMAL HIGH (ref 0.0–0.1)
Basophils Relative: 2 %
Eosinophils Absolute: 0 10*3/uL (ref 0.0–0.5)
Eosinophils Relative: 0 %
HCT: 35 % — ABNORMAL LOW (ref 36.0–46.0)
Hemoglobin: 10.3 g/dL — ABNORMAL LOW (ref 12.0–15.0)
Lymphocytes Relative: 7 %
Lymphs Abs: 1.4 10*3/uL (ref 0.7–4.0)
MCH: 24.4 pg — ABNORMAL LOW (ref 26.0–34.0)
MCHC: 29.4 g/dL — ABNORMAL LOW (ref 30.0–36.0)
MCV: 82.9 fL (ref 80.0–100.0)
Metamyelocytes Relative: 11 %
Monocytes Absolute: 1.6 10*3/uL — ABNORMAL HIGH (ref 0.1–1.0)
Monocytes Relative: 8 %
Myelocytes: 10 %
Neutro Abs: 12 10*3/uL — ABNORMAL HIGH (ref 1.7–7.7)
Neutrophils Relative %: 55 %
Platelet Count: 379 10*3/uL (ref 150–400)
RBC: 4.22 MIL/uL (ref 3.87–5.11)
RDW: 19 % — ABNORMAL HIGH (ref 11.5–15.5)
WBC Count: 19.4 10*3/uL — ABNORMAL HIGH (ref 4.0–10.5)
nRBC: 0.7 % — ABNORMAL HIGH (ref 0.0–0.2)

## 2019-09-28 LAB — CMP (CANCER CENTER ONLY)
ALT: 22 U/L (ref 0–44)
AST: 30 U/L (ref 15–41)
Albumin: 4.6 g/dL (ref 3.5–5.0)
Alkaline Phosphatase: 57 U/L (ref 38–126)
Anion gap: 12 (ref 5–15)
BUN: 18 mg/dL (ref 6–20)
CO2: 25 mmol/L (ref 22–32)
Calcium: 9.1 mg/dL (ref 8.9–10.3)
Chloride: 103 mmol/L (ref 98–111)
Creatinine: 0.62 mg/dL (ref 0.44–1.00)
GFR, Est AFR Am: >60 mL/min (ref >60)
GFR, Estimated: >60 mL/min (ref >60)
Glucose, Bld: 93 mg/dL (ref 70–99)
Potassium: 3.9 mmol/L (ref 3.5–5.1)
Sodium: 140 mmol/L (ref 135–145)
Total Bilirubin: 1.3 mg/dL — ABNORMAL HIGH (ref 0.3–1.2)
Total Protein: 7.4 g/dL (ref 6.5–8.1)

## 2019-09-28 MED ORDER — TRAMADOL HCL 50 MG PO TABS: 25.0000 mg | ORAL_TABLET | Freq: Three times a day (TID) | ORAL | 0 refills | Status: AC | PRN

## 2019-09-28 MED ORDER — TRAMADOL 50 MG TABLET
ORAL | 0 days
Start: 2019-09-28 — End: ?

## 2019-09-28 NOTE — Progress Notes (Addendum)
  [place] OFFICE PROGRESS NOTE   Diagnosis: Essential thrombocytosis  INTERVAL HISTORY:   Lynn Mccarthy returns prior to scheduled follow-up for evaluation of left-sided abdominal pain.  She reports onset of mid back pain onset about 4 weeks ago.  She was referred to physical therapy.  8 days ago she had deep tissue massage type therapy to the low back and gluteal regions.  When she sat up after this was completed she felt an "electrifying shock" at the left upper abdomen.  She continues to have pain/tenderness in this area.  She denies nausea.  Bowels are moving.  No bowel or bladder dysfunction.  No leg weakness or numbness.  Objective:  Vital signs in last 24 hours:  Blood pressure 125/81, pulse 82, temperature 97.9 F (36.6 C), temperature source Tympanic, resp. rate 16, height $RemoveBe'5\' 5"'[id number]$  (1.651 m), weight 154 lb (69.9 kg), SpO2 100 %.    Lymphatics: No palpable cervical, supraclavicular or axillary lymph nodes. Resp: Lungs clear bilaterally. Cardio: Regular rate and rhythm. GI: No hepatomegaly.  Spleen enlarged to the level of the umbilicus and just crossing midline.  Focal marked tenderness left mid abdomen. Vascular: No leg edema. Skin: Faint erythematous rash at the upper outer arms bilaterally.   Lab Results:  Lab Results  Component Value Date   WBC 14.9 (H) 06/20/2019   HGB 10.0 (L) 06/20/2019   HCT 35.0 (L) 06/20/2019   MCV 84.5 06/20/2019   PLT 272 06/20/2019   NEUTROABS 11.0 (H) 06/20/2019    Imaging:  No results found.  Medications: I have reviewed the patient's current medications.  Assessment/Plan: 1. Essential thrombocytosis. The platelet count is in the normal range. Stable splenomegaly.  Bone marrow biopsy 06/28/2001-hypercellular marrow with a chronic myeloproliferative disorder, absent iron stores, marked increase in megakaryocytes, reticulin stain showed mild fibrosis and trichrome stain was negative for collagenous fibrosis  CALR  mutation 05/19/2019  Bone marrow biopsy 06/20/2019-hypercellular marrow with megakaryocytic hyperplasia and fibrosis; cytogenetic analysis normal 2. Microcytic anemia. Improved with iron therapy 3. Rash 4. Bone pain  Disposition: Lynn Mccarthy has a myeloproliferative disorder currently being followed with observation.  She has marked splenomegaly which is chronic.  She presents today with sudden onset of focal left mid to upper quadrant pain about a week ago following a physical therapy session.  We are referring her for CT scans to evaluate the pain, question splenic infarction.  We will send a prescription to her pharmacy for tramadol.  She understands she should not drive while taking tramadol.  She is returning to the lab today for a CBC and chemistry panel.  We scheduled a return visit in 4 to 6 weeks, sooner pending CT results.  Patient seen with Dr. Benay Spice.    Ned Card ANP/GNP-BC   09/28/2019  3:33 PM  This was a shared visit with Ned Card.  Lynn Mccarthy was interviewed and examined.  She had acute onset of left upper abdomen pain during a massage procedure.  She continues to have pain and tenderness in the left upper abdomen.  This is at the site of splenomegaly.  She will be referred for a CT evaluation.  She is stable from a hematologic standpoint.  She remains off of specific therapy for the myeloproliferative disorder.   Julieanne Manson, MD

## 2019-09-29 ENCOUNTER — Encounter: Payer: Self-pay | Admitting: Nurse Practitioner

## 2019-09-29 ENCOUNTER — Telehealth: Payer: Self-pay | Admitting: Nurse Practitioner

## 2019-09-29 NOTE — Telephone Encounter (Signed)
Scheduled appointment per 9/16 los. Called patient, no answer. Left message for patient with appointment date and time.

## 2019-10-03 ENCOUNTER — Other Ambulatory Visit: Payer: Self-pay | Admitting: Nurse Practitioner

## 2019-10-03 DIAGNOSIS — R161 Splenomegaly, not elsewhere classified: Secondary | ICD-10-CM

## 2019-10-03 DIAGNOSIS — D473 Essential (hemorrhagic) thrombocythemia: Secondary | ICD-10-CM

## 2019-10-04 ENCOUNTER — Other Ambulatory Visit: Payer: Self-pay | Admitting: Obstetrics and Gynecology

## 2019-10-04 DIAGNOSIS — N63 Unspecified lump in unspecified breast: Secondary | ICD-10-CM

## 2019-10-05 ENCOUNTER — Other Ambulatory Visit: Payer: Self-pay

## 2019-10-05 ENCOUNTER — Ambulatory Visit (HOSPITAL_COMMUNITY)
Admission: RE | Admit: 2019-10-05 | Discharge: 2019-10-05 | Disposition: A | Discharge status: Home / Self Care | Payer: BC Managed Care – PPO | Source: Ambulatory Visit | Attending: Nurse Practitioner | Admitting: Nurse Practitioner

## 2019-10-05 DIAGNOSIS — D473 Essential (hemorrhagic) thrombocythemia: Secondary | ICD-10-CM

## 2019-10-05 DIAGNOSIS — R161 Splenomegaly, not elsewhere classified: Secondary | ICD-10-CM | POA: Diagnosis present

## 2019-10-05 MED ORDER — IOHEXOL 300 MG/ML  SOLN
100.0000 mL | Freq: Once | INTRAMUSCULAR | Status: AC | PRN
  Administered 2019-10-05: 100 mL via INTRAVENOUS

## 2019-10-06 ENCOUNTER — Telehealth: Payer: Self-pay

## 2019-10-06 ENCOUNTER — Ambulatory Visit (HOSPITAL_COMMUNITY): Payer: BC Managed Care – PPO

## 2019-10-06 NOTE — Telephone Encounter (Signed)
I left a detailed message for pt. Per Ned Card, NP, CT scan is negative; nonspecific lung nodule can be followed with a repeat CT in 6-12 months if no hx of smoking. Pt was also advised to call back if she has questions/concerns.

## 2019-10-09 ENCOUNTER — Other Ambulatory Visit: Payer: BC Managed Care – PPO

## 2019-10-09 ENCOUNTER — Encounter: Payer: Self-pay | Admitting: Nurse Practitioner

## 2019-10-16 ENCOUNTER — Telehealth: Payer: Self-pay | Admitting: *Deleted

## 2019-10-16 ENCOUNTER — Other Ambulatory Visit: Admit: 2019-10-16 | Discharge: 2019-10-17 | Payer: PRIVATE HEALTH INSURANCE

## 2019-10-16 ENCOUNTER — Encounter
Admit: 2019-10-16 | Discharge: 2019-10-17 | Payer: PRIVATE HEALTH INSURANCE | Attending: Adult Health | Primary: Adult Health

## 2019-10-16 DIAGNOSIS — D7581 Myelofibrosis: Principal | ICD-10-CM

## 2019-10-16 MED ORDER — OXYCODONE 5 MG TABLET
ORAL_TABLET | ORAL | 0 refills | 14.00000 days | Status: CP | PRN
Start: 2019-10-16 — End: 2019-10-30

## 2019-10-16 NOTE — Telephone Encounter (Addendum)
Called to report she saw Dr. Evelene Croon today and she suggested she start the [facility] therapy. Asking if Dr. Benay Spice will order the medication and provide her labs/follow up while taking.

## 2019-10-18 DIAGNOSIS — D7581 Myelofibrosis: Principal | ICD-10-CM

## 2019-10-18 MED ORDER — RUXOLITINIB 20 MG TABLET
ORAL_TABLET | Freq: Two times a day (BID) | ORAL | 2 refills | 30 days | Status: CP
Start: 2019-10-18 — End: 2019-11-17

## 2019-10-19 ENCOUNTER — Telehealth: Payer: Self-pay

## 2019-10-19 ENCOUNTER — Other Ambulatory Visit: Payer: Self-pay | Admitting: Nurse Practitioner

## 2019-10-19 DIAGNOSIS — D7581 Myelofibrosis: Principal | ICD-10-CM

## 2019-10-19 DIAGNOSIS — R161 Splenomegaly, not elsewhere classified: Secondary | ICD-10-CM

## 2019-10-19 DIAGNOSIS — D473 Essential (hemorrhagic) thrombocythemia: Secondary | ICD-10-CM

## 2019-10-19 NOTE — Telephone Encounter (Signed)
Sent scheduling message for lab appointment. Patient was instructed to call or MyChart message the office once she starts her medication so a follow up appointment can be scheduled. Patient verbalized understanding.

## 2019-10-20 ENCOUNTER — Inpatient Hospital Stay: Payer: BC Managed Care – PPO

## 2019-10-20 ENCOUNTER — Other Ambulatory Visit: Payer: Self-pay

## 2019-10-23 ENCOUNTER — Ambulatory Visit: Payer: BC Managed Care – PPO

## 2019-10-23 ENCOUNTER — Encounter: Payer: Self-pay | Admitting: Oncology

## 2019-10-23 ENCOUNTER — Other Ambulatory Visit: Payer: Self-pay | Admitting: Nurse Practitioner

## 2019-10-23 ENCOUNTER — Other Ambulatory Visit: Payer: BC Managed Care – PPO

## 2019-10-23 DIAGNOSIS — D7581 Myelofibrosis: Principal | ICD-10-CM

## 2019-10-23 DIAGNOSIS — D473 Essential (hemorrhagic) thrombocythemia: Secondary | ICD-10-CM

## 2019-10-23 MED ORDER — RUXOLITINIB 20 MG TABLET
ORAL_TABLET | Freq: Two times a day (BID) | ORAL | 0 refills | 30.00000 days | Status: CP
Start: 2019-10-23 — End: 2019-11-22
  Filled 2019-10-25: qty 60, 30d supply, fill #0

## 2019-10-24 ENCOUNTER — Inpatient Hospital Stay: Payer: BC Managed Care – PPO | Attending: Nurse Practitioner

## 2019-10-24 ENCOUNTER — Other Ambulatory Visit: Payer: Self-pay

## 2019-10-24 DIAGNOSIS — R911 Solitary pulmonary nodule: Secondary | ICD-10-CM | POA: Diagnosis not present

## 2019-10-24 DIAGNOSIS — D473 Essential (hemorrhagic) thrombocythemia: Secondary | ICD-10-CM

## 2019-10-24 LAB — CBC WITH DIFFERENTIAL (CANCER CENTER ONLY)
Abs Immature Granulocytes: 3.18 10*3/uL — ABNORMAL HIGH (ref 0.00–0.07)
Basophils Absolute: 0.1 10*3/uL (ref 0.0–0.1)
Basophils Relative: 1 %
Eosinophils Absolute: 0.1 10*3/uL (ref 0.0–0.5)
Eosinophils Relative: 1 %
HCT: 30.9 % — ABNORMAL LOW (ref 36.0–46.0)
Hemoglobin: 9.1 g/dL — ABNORMAL LOW (ref 12.0–15.0)
Immature Granulocytes: 24 %
Lymphocytes Relative: 7 %
Lymphs Abs: 1 10*3/uL (ref 0.7–4.0)
MCH: 24.6 pg — ABNORMAL LOW (ref 26.0–34.0)
MCHC: 29.4 g/dL — ABNORMAL LOW (ref 30.0–36.0)
MCV: 83.5 fL (ref 80.0–100.0)
Monocytes Absolute: 0.6 10*3/uL (ref 0.1–1.0)
Monocytes Relative: 5 %
Neutro Abs: 8.5 10*3/uL — ABNORMAL HIGH (ref 1.7–7.7)
Neutrophils Relative %: 62 %
Platelet Count: 297 10*3/uL (ref 150–400)
RBC: 3.7 MIL/uL — ABNORMAL LOW (ref 3.87–5.11)
RDW: 18.1 % — ABNORMAL HIGH (ref 11.5–15.5)
WBC Count: 13.5 10*3/uL — ABNORMAL HIGH (ref 4.0–10.5)
nRBC: 0.7 % — ABNORMAL HIGH (ref 0.0–0.2)

## 2019-10-24 LAB — CMP (CANCER CENTER ONLY)
ALT: 16 U/L (ref 0–44)
AST: 25 U/L (ref 15–41)
Albumin: 3.5 g/dL (ref 3.5–5.0)
Alkaline Phosphatase: 52 U/L (ref 38–126)
Anion gap: 4 — ABNORMAL LOW (ref 5–15)
BUN: 9 mg/dL (ref 6–20)
CO2: 28 mmol/L (ref 22–32)
Calcium: 8.2 mg/dL — ABNORMAL LOW (ref 8.9–10.3)
Chloride: 112 mmol/L — ABNORMAL HIGH (ref 98–111)
Creatinine: 0.66 mg/dL (ref 0.44–1.00)
GFR, Estimated: >60 mL/min (ref >60)
Glucose, Bld: 97 mg/dL (ref 70–99)
Potassium: 3.8 mmol/L (ref 3.5–5.1)
Sodium: 144 mmol/L (ref 135–145)
Total Bilirubin: 0.6 mg/dL (ref 0.3–1.2)
Total Protein: 6.1 g/dL — ABNORMAL LOW (ref 6.5–8.1)

## 2019-10-25 MED FILL — JAKAFI 20 MG TABLET: 30 days supply | Qty: 60 | Fill #0 | Status: AC

## 2019-10-25 NOTE — Unmapped (Signed)
[facility] Specialty Medication Onboarding    Specialty Medication: Jakafi 20mg  tablets  Prior Authorization: Approved   Financial Assistance: Yes - copay card approved as secondary   Final Copay/Day Supply: $0 / 30 days    Insurance Restrictions: Yes - max 1 month supply     Notes to Pharmacist:     The triage team has completed the benefits investigation and has determined that the patient is able to fill this medication at [facility]. Please contact the patient to complete the onboarding or follow up with the prescribing physician as needed.

## 2019-10-25 NOTE — Unmapped (Signed)
[facility]   Patient Onboarding/Medication Counseling    Mary Navarro is a 49 y.o. female with Myelofibrosis who I am counseling today on initiation of therapy.  I am speaking to the patient.    Was a Nurse, learning disability used for this call? No    Verified patient's date of birth / HIPAA.    Specialty medication(s) to be sent: Hematology/Oncology: Earvin Hansen    Non-specialty medications/supplies to be sent: none    Medications not needed at this time: none     Jakafi (ruxolitinib)    Medication & Administration     Dosage: Take 1 tablet (20 mg total) by mouth Two (2) times a day.    Administration:   ??? Take with or without food    Adherence/Missed dose instructions:  ??? Skip the missed dose and go back to your normal time.  ??? Do not take 2 doses at the same time or extra doses.    Goals of Therapy   ??? It is used to treat myelofibrosis.  ??? It is used to treat polycythemia vera.  ??? It is used to treat graft-versus-host disease (GVHD)    Side Effects & Monitoring Parameters   ??? Common side effects  ??? Diarrhea, constipation, stomach pain or gas  ??? Dizziness  ??? Headache  ??? Muscle spasm  ??? Weight gain  ??? Nose or throat irritation  ??? Joint pain    ??? The following side effects should be reported to the provider  ??? Allergic reaction  ??? Infection like symptoms  ??? Unexplained bleeding or bruising  ??? Painful skin rash with blisters  ??? Upset stomach or vomiting  ??? Shortness of breath  ??? Feeling tired or weak  ??? Swelling  ??? Confusion, memory problems, low mood, changes in behavior, change in strength on 1 side is greater than the other, trouble speaking or thinking, change in balance, or change in eyesight.  ??? Change in color or size of a mole, any new or changing skin lump or growth    ??? Monitoring Parameters:  ??? CBC baseline and every 2-4 weeks  ??? Lipid panels 8-12 weeks after therapy initiated  ??? TB skin test prior to starting    Contraindications, Warnings, & Precautions     ??? Hematologic toxicity, including thrombocytopenia, anemia, and neutropenia  ??? Infections  ??? Lipid abnormalities  ??? Non-melanoma skin cancer  ??? Use caution in patients with hepatic or renal impairment  ??? Withdrawal syndrome - fever, respiratory distress, hypotension, multiorgan failure, splenomegaly, worsening cytopenias, hemodynamic compensation, and septic shock-like syndrome have been reported with treatment tapering or discontinuation. Patients should not interrupt/discontinue treatment without consulting a healthcare provider.    Pregnancy & Lactation Considerations  ??? Not recommended for use during pregnancy  ??? Breastfeeding is not recommended during treatment and for 2 weeks after the last dose    Drug/Food Interactions     ??? Medication list reviewed in Epic. The patient was instructed to inform the care team before taking any new medications or supplements. Stop herbal supplements like Ashwagandha.   ??? Avoid grapefruit juice  ??? Ask your doctor before getting any live or inactivated vaccinations    Storage, Handling Precautions, & Disposal     ??? Store at room temperature  ??? Keep away from children and pets  ??? Caregivers recommended to wear gloves when handling medication    Current Medications (including OTC/herbals), Comorbidities and Allergies     Current Outpatient Medications   Medication Sig  Dispense Refill   ??? ASHWAGANDHA ROOT EXTRACT,BULK, MISC      ??? BORIC ACID MISC Compounded Boric Acid 600mg  vaginal capsules     ??? cetirizine (ZYRTEC) 10 MG tablet Take 10 mg by mouth.     ??? cholecalciferol, vitamin D3-25 mcg, 1,000 unit,, (VITAMIN D3) 25 mcg (1,000 unit) capsule Take 25 mcg by mouth daily.     ??? ferrous sulfate 324 mg (65 mg iron) EC tablet      ??? fluorouraciL (EFUDEX) 5 % cream fluorouracil 5 % topical cream     ??? fluticasone propionate (FLONASE) 50 mcg/actuation nasal spray 1 spray into each nostril.     ??? multivit-min/ferrous fumarate (MULTI VITAMIN ORAL)      ??? olopatadine (PAZEO) 0.7 % ophthalmic solution Pazeo 0.7 % eye drops INSTILL 1 DROP INTO BOTH EYES EVERY DAY AS NEEDED     ??? oxyCODONE (ROXICODONE) 5 MG immediate release tablet Take 1 tablet (5 mg total) by mouth every four (4) hours as needed for pain for up to 14 days. 84 tablet 0   ??? ruxolitinib (JAKAFI) 20 mg tablet Take 1 tablet (20 mg total) by mouth Two (2) times a day. 60 tablet 2   ??? ruxolitinib (JAKAFI) 20 mg tablet Take 1 tablet (20 mg total) by mouth Two (2) times a day. 60 tablet 0   ??? sertraline HCl (ZOLOFT ORAL)      ??? zinc gluconate 50 mg (7 mg elemental zinc) tablet Take 50 mg by mouth daily.       No current facility-administered medications for this visit.       Allergies   Allergen Reactions   ??? Paroxetine Hcl Other (See Comments)     Other reaction(s): Other (See Comments)  Made her dazed  Made her dazed     ??? Doxycycline Nausea Only     Stomach cramps  Stomach cramps         Patient Active Problem List   Diagnosis   ??? Myelofibrosis (CMS-HCC)       Reviewed and up to date in Epic.    Appropriateness of Therapy     Is medication and dose appropriate based on diagnosis? Yes    Prescription has been clinically reviewed: Yes    Baseline Quality of Life Assessment      How many days over the past month did your myelofibrosis  keep you from your normal activities? For example, brushing your teeth or getting up in the morning. 0    Financial Information     Medication Assistance provided: Prior Authorization and Copay Assistance    Anticipated copay of $0 / 30 days reviewed with patient. Verified delivery address.    Delivery Information     Scheduled delivery date: 10/26/19    Expected start date: 10/26/19    Medication will be delivered via UPS to the prescription address in [facility].  This shipment will not require a signature.      Explained the services we provide at [facility] and that each month we would call to set up refills.  Stressed importance of returning phone calls so that we could ensure they receive their medications in time each month.  Informed patient that we should be setting up refills 7-10 days prior to when they will run out of medication.  A pharmacist will reach out to perform a clinical assessment periodically.  Informed patient that a welcome packet and a drug information handout will be sent.  Patient verbalized understanding of the above information as well as how to contact the pharmacy at [phone] option 4 with any questions/concerns.  The pharmacy is open Monday through Friday 8:30am-4:30pm.  A pharmacist is available 24/7 via pager to answer any clinical questions they may have.    Patient Specific Needs     - Does the patient have any physical, cognitive, or cultural barriers? No    - Patient prefers to have medications discussed with  Patient     - Is the patient or caregiver able to read and understand education materials at a high school level or above? Yes    - Patient's primary language is  English     - Is the patient high risk? Yes, patient is taking oral chemotherapy. Appropriateness of therapy as been assessed    - Does the patient require a Care Management Plan? No     - Does the patient require physician intervention or other additional services (i.e. nutrition, smoking cessation, social work)? No      Brytney Somes [facility] Specialty Pharmacist

## 2019-10-26 ENCOUNTER — Telehealth: Payer: Self-pay

## 2019-10-26 LAB — QUANTIFERON-TB GOLD PLUS (RQFGPL)
QuantiFERON Mitogen Value: 0.21 IU/mL
QuantiFERON Nil Value: 0.07 IU/mL
QuantiFERON TB1 Ag Value: 0.07 IU/mL
QuantiFERON TB2 Ag Value: 0.04 IU/mL

## 2019-10-26 LAB — QUANTIFERON-TB GOLD PLUS: QuantiFERON-TB Gold Plus: UNDETERMINED — AB

## 2019-10-26 NOTE — Telephone Encounter (Signed)
-----   Message from Owens Shark, NP sent at 10/26/2019  9:05 AM EDT ----- Please call her--has she started [facility]?

## 2019-10-26 NOTE — Telephone Encounter (Signed)
Called patient this morning, no answer. Asked patient to call the office back. Will call patient again later today.

## 2019-10-26 NOTE — Telephone Encounter (Signed)
Patient called and left a message stating her Lynn Mccarthy is coming in today and she is supposed to start it tonight. Patient questioned results from TB test. Per Lattie Haw NP will have to look into lab result more and not to start [place] until TB results are in.  Left a message on patient voicemail and sent a MyChart message. Advised to call back or send a MyChart message with any further question.

## 2019-10-27 ENCOUNTER — Other Ambulatory Visit: Payer: Self-pay | Admitting: *Deleted

## 2019-10-29 MED ORDER — PROMETHAZINE-DM 6.25 MG-15 MG/5 ML ORAL SYRUP
0 days
Start: 2019-10-29 — End: ?

## 2019-10-30 ENCOUNTER — Telehealth: Payer: Self-pay | Admitting: *Deleted

## 2019-10-30 ENCOUNTER — Ambulatory Visit: Payer: BC Managed Care – PPO | Admitting: Oncology

## 2019-10-30 DIAGNOSIS — D473 Essential (hemorrhagic) thrombocythemia: Secondary | ICD-10-CM

## 2019-10-30 NOTE — Telephone Encounter (Signed)
Called patient to confirm start date of her jackafi--it was pm dose on 10/26/19. She reports she is not able to make her appointment here on 10/25--will be having her office visits at [facility] on 11/15 and in December. Asking to have lab on 10/28 per order of [facility]. Scheduled for 10/28 at 3:30 pm. Faxed TB gold test to [facility]

## 2019-11-06 ENCOUNTER — Ambulatory Visit: Payer: BC Managed Care – PPO | Admitting: Oncology

## 2019-11-09 ENCOUNTER — Other Ambulatory Visit: Payer: Self-pay

## 2019-11-09 ENCOUNTER — Inpatient Hospital Stay: Payer: BC Managed Care – PPO

## 2019-11-09 DIAGNOSIS — D473 Essential (hemorrhagic) thrombocythemia: Secondary | ICD-10-CM | POA: Diagnosis not present

## 2019-11-09 LAB — CBC WITH DIFFERENTIAL (CANCER CENTER ONLY)
Abs Immature Granulocytes: 7.59 10*3/uL — ABNORMAL HIGH (ref 0.00–0.07)
Basophils Absolute: 0.3 10*3/uL — ABNORMAL HIGH (ref 0.0–0.1)
Basophils Relative: 1 %
Eosinophils Absolute: 0.4 10*3/uL (ref 0.0–0.5)
Eosinophils Relative: 1 %
HCT: 34.7 % — ABNORMAL LOW (ref 36.0–46.0)
Hemoglobin: 10.4 g/dL — ABNORMAL LOW (ref 12.0–15.0)
Immature Granulocytes: 24 %
Lymphocytes Relative: 9 %
Lymphs Abs: 2.8 10*3/uL (ref 0.7–4.0)
MCH: 24.4 pg — ABNORMAL LOW (ref 26.0–34.0)
MCHC: 30 g/dL (ref 30.0–36.0)
MCV: 81.5 fL (ref 80.0–100.0)
Monocytes Absolute: 1.3 10*3/uL — ABNORMAL HIGH (ref 0.1–1.0)
Monocytes Relative: 4 %
Neutro Abs: 19.8 10*3/uL — ABNORMAL HIGH (ref 1.7–7.7)
Neutrophils Relative %: 61 %
Platelet Count: 340 10*3/uL (ref 150–400)
RBC: 4.26 MIL/uL (ref 3.87–5.11)
RDW: 17.9 % — ABNORMAL HIGH (ref 11.5–15.5)
WBC Count: 32.2 10*3/uL — ABNORMAL HIGH (ref 4.0–10.5)
nRBC: 0.2 % (ref 0.0–0.2)

## 2019-11-10 ENCOUNTER — Encounter: Payer: Self-pay | Admitting: Oncology

## 2019-11-10 ENCOUNTER — Other Ambulatory Visit: Payer: Self-pay | Admitting: *Deleted

## 2019-11-14 DIAGNOSIS — D7581 Myelofibrosis: Principal | ICD-10-CM

## 2019-11-14 MED ORDER — JAKAFI 20 MG TABLET
ORAL_TABLET | Freq: Two times a day (BID) | ORAL | 0 refills | 30 days
Start: 2019-11-14 — End: 2019-12-14
  Filled 2019-11-20: qty 60, 30d supply, fill #0

## 2019-11-14 MED ORDER — AMOXICILLIN 875 MG-POTASSIUM CLAVULANATE 125 MG TABLET
0 days
Start: 2019-11-14 — End: 2019-11-27

## 2019-11-14 NOTE — Unmapped (Signed)
[facility] Clinical Assessment & Refill Coordination Note    Mary Navarro [place], DOB: 1970/04/02  Phone: [phone] (home)     All above HIPAA information was verified with patient.     Was a Nurse, learning disability used for this call? No    Specialty Medication(s):   Hematology/Oncology: [id number]     Current Outpatient Medications   Medication Sig Dispense Refill   ??? ASHWAGANDHA ROOT EXTRACT,BULK, MISC      ??? BORIC ACID MISC Compounded Boric Acid 600mg  vaginal capsules     ??? cetirizine (ZYRTEC) 10 MG tablet Take 10 mg by mouth.     ??? cholecalciferol, vitamin D3-25 mcg, 1,000 unit,, (VITAMIN D3) 25 mcg (1,000 unit) capsule Take 25 mcg by mouth daily.     ??? ferrous sulfate 324 mg (65 mg iron) EC tablet      ??? fluorouraciL (EFUDEX) 5 % cream fluorouracil 5 % topical cream     ??? fluticasone propionate (FLONASE) 50 mcg/actuation nasal spray 1 spray into each nostril.     ??? multivit-min/ferrous fumarate (MULTI VITAMIN ORAL)      ??? olopatadine (PAZEO) 0.7 % ophthalmic solution Pazeo 0.7 % eye drops   INSTILL 1 DROP INTO BOTH EYES EVERY DAY AS NEEDED     ??? ruxolitinib (JAKAFI) 20 mg tablet Take 1 tablet (20 mg total) by mouth Two (2) times a day. 60 tablet 2   ??? ruxolitinib (JAKAFI) 20 mg tablet Take 1 tablet (20 mg total) by mouth Two (2) times a day. 60 tablet 0   ??? sertraline HCl (ZOLOFT ORAL)      ??? zinc gluconate 50 mg (7 mg elemental zinc) tablet Take 50 mg by mouth daily.       No current facility-administered medications for this visit.        Changes to medications: Mary Navarro reports no changes at this time.    Allergies   Allergen Reactions   ??? Paroxetine Hcl Other (See Comments)     Other reaction(s): Other (See Comments)  Made her dazed  Made her dazed     ??? Doxycycline Nausea Only     Stomach cramps  Stomach cramps         Changes to allergies: No    SPECIALTY MEDICATION ADHERENCE     Jakafi 20 mg: 11 days of medicine on hand       Medication Adherence    Patient reported X missed doses in the last month: 0  Specialty Medication: Jakafi 20mg  BID  Informant: patient          Specialty medication(s) dose(s) confirmed: Regimen is correct and unchanged.     Are there any concerns with adherence? No    Adherence counseling provided? Not needed    CLINICAL MANAGEMENT AND INTERVENTION      Clinical Benefit Assessment:    Do you feel the medicine is effective or helping your condition? Yes. Patient reported that spleen size has shrunk (is no longer palpable) but area remains tender.    Clinical Benefit counseling provided? Not needed    Adverse Effects Assessment:    Are you experiencing any side effects? Yes, patient reports experiencing soreness in her elbows and knees, low energy (same as her baseline), bone soreness from conducting ADLs, and has suspicion of developing a UTI.. Side effect counseling provided: I recommended OTC Tylenol to help relieve pain and joint soreness. Pt also has an appt today with her local provider to evaluate UTI symptoms  Are you experiencing difficulty administering your medicine? No    Quality of Life Assessment:    How many days over the past month did your myelofibrosis  keep you from your normal activities? For example, brushing your teeth or getting up in the morning. Patient declined to answer    Have you discussed this with your provider? Not needed    Therapy Appropriateness:    Is therapy appropriate? Yes, therapy is appropriate and should be continued    DISEASE/MEDICATION-SPECIFIC INFORMATION      N/A     PATIENT SPECIFIC NEEDS     - Does the patient have any physical, cognitive, or cultural barriers? No    - Is the patient high risk? Yes, patient is taking oral chemotherapy. Appropriateness of therapy as been assessed    - Does the patient require a Care Management Plan? No     - Does the patient require physician intervention or other additional services (i.e. nutrition, smoking cessation, social work)? No      SHIPPING     Specialty Medication(s) to be Shipped:   Hematology/Oncology: Jakafi    Other medication(s) to be shipped: No additional medications requested for fill at this time     Changes to insurance: No    Delivery Scheduled: Yes, Expected medication delivery date: 11/21/19.  However, Rx request for refills was sent to the provider as there are none remaining.     Medication will be delivered via UPS to the confirmed prescription address in [facility].    The patient will receive a drug information handout for each medication shipped and additional FDA Medication Guides as required.  Verified that patient has previously received a Conservation officer, historic buildings.    All of the patient's questions and concerns have been addressed.    Vernon Prey   [facility] Specialty Pharmacist  I reviewed this patient case and all documentation provided by the learner and was readily available for consultation during their interaction with the patient.  I agree with the assessment and plan listed below.    Breck Coons [facility] Specialty Pharmacist

## 2019-11-17 ENCOUNTER — Other Ambulatory Visit: Payer: Self-pay

## 2019-11-17 ENCOUNTER — Ambulatory Visit
Admission: RE | Admit: 2019-11-17 | Discharge: 2019-11-17 | Disposition: A | Discharge status: Home / Self Care | Payer: BC Managed Care – PPO | Source: Ambulatory Visit | Attending: Obstetrics and Gynecology | Admitting: Obstetrics and Gynecology

## 2019-11-17 DIAGNOSIS — N63 Unspecified lump in unspecified breast: Secondary | ICD-10-CM

## 2019-11-20 DIAGNOSIS — D471 Chronic myeloproliferative disease: Principal | ICD-10-CM

## 2019-11-20 MED FILL — JAKAFI 20 MG TABLET: 30 days supply | Qty: 60 | Fill #0 | Status: AC

## 2019-11-21 NOTE — Unmapped (Signed)
Clinical Pharmacist Practitioner: [facility]       Patient Information: Mary Navarro  is a 49 y.o. 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    Dose: 20mg  two times daily      A/P: (discussed with Dr Anise Salvo)   -adjust ruxolitinib dose to 20mg  once daily  -Labs (cbc, cmp) asap    Counseled patient on:  -dose adjustment of ruxolitinib  -need for blood draw for cbc, cmp asap at local [facility]    Ms.Oehler verbalized understanding of the treatment plan provided and had no further questions.     F/u: 11/27/19    ____________________________________________________________________________________    Interim History:  Patient reported following:  -started ruxolitinib on evening of 10/26/19  -bone pain (pain when leaning elbow on table), nausea (whole day) started two weeks ago, with emesis yesterday  -waking up middle of the night, unable to go back to sleep    Adherence: Patient reported missing 0 doses from start of ruxolitinib     Side effects: bone pain, insomnia, nausea, vomiting    Medication access issues:  Pharmacy: [facility]  Insurance: [employer]  Copay: $0 with copay card    Labs:   No visits with results within 1 Day(s) from this visit.   Latest known visit with results is:   Lab on 10/16/2019   Component Date Value Ref Range Status   ??? Sodium 10/16/2019 139  135 - 145 mmol/L Final   ??? Potassium 10/16/2019 4.3  3.4 - 4.5 mmol/L Final   ??? Chloride 10/16/2019 106  98 - 107 mmol/L Final   ??? Anion Gap 10/16/2019 7  5 - 14 mmol/L Final   ??? CO2 10/16/2019 26.0  20.0 - 31.0 mmol/L Final   ??? BUN 10/16/2019 19  9 - 23 mg/dL Final   ??? Creatinine 10/16/2019 0.78  0.55 - 1.02 mg/dL Final   ??? BUN/Creatinine Ratio 10/16/2019 24   Final   ??? EGFR CKD-EPI Non-African American,* 10/16/2019 >90  >=60 mL/min/1.50m2 Final   ??? EGFR CKD-EPI African American, Fem* 10/16/2019 >90  >=60 mL/min/1.29m2 Final   ??? Glucose 10/16/2019 95  70 - 179 mg/dL Final   ??? Calcium 16/10/9602 9.4  8.7 - 10.4 mg/dL Final   ??? Albumin 54/09/8117 4.4  3.4 - 5.0 g/dL Final   ??? Total Protein 10/16/2019 7.5  5.7 - 8.2 g/dL Final   ??? Total Bilirubin 10/16/2019 1.0  0.3 - 1.2 mg/dL Final   ??? AST 14/78/2956 33  <=34 U/L Final   ??? ALT 10/16/2019 18  10 - 49 U/L Final   ??? Alkaline Phosphatase 10/16/2019 67  46 - 116 U/L Final   ??? LDH 10/16/2019 928* 120 - 246 U/L Final   ??? WBC 10/16/2019 21.0* 4.5 - 11.0 10*9/L Final   ??? RBC 10/16/2019 4.84  4.00 - 5.20 10*12/L Final   ??? HGB 10/16/2019 12.4  12.0 - 16.0 g/dL Final   ??? HCT 21/30/8657 40.4  36.0 - 46.0 % Final   ??? MCV 10/16/2019 83.4  80.0 - 100.0 fL Final   ??? MCH 10/16/2019 25.7* 26.0 - 34.0 pg Final   ??? MCHC 10/16/2019 30.8* 31.0 - 37.0 g/dL Final   ??? RDW 84/69/6295 18.7* 12.0 - 15.0 % Final   ??? MPV 10/16/2019 9.9  7.0 - 10.0 fL Final   ??? Platelet 10/16/2019 456* 150 - 440 10*9/L Final   ??? Variable HGB Concentration 10/16/2019 Slight* Not Present Final   ???  Neutrophils % 10/16/2019 88.9  % Final   ??? Lymphocytes % 10/16/2019 3.7  % Final   ??? Monocytes % 10/16/2019 3.7  % Final   ??? Eosinophils % 10/16/2019 1.1  % Final   ??? Basophils % 10/16/2019 0.9  % Final   ??? Neutrophil Left Shift 10/16/2019 1+* Not Present Final   ??? Absolute Neutrophils 10/16/2019 18.7* 2.0 - 7.5 10*9/L Final   ??? Absolute Lymphocytes 10/16/2019 0.8* 1.5 - 5.0 10*9/L Final   ??? Absolute Monocytes 10/16/2019 0.8  0.2 - 0.8 10*9/L Final   ??? Absolute Eosinophils 10/16/2019 0.2  0.0 - 0.4 10*9/L Final   ??? Absolute Basophils 10/16/2019 0.2* 0.0 - 0.1 10*9/L Final   ??? Large Unstained Cells 10/16/2019 2  0 - 4 % Final   ??? Microcytosis 10/16/2019 Slight* Not Present Final   ??? Anisocytosis 10/16/2019 Moderate* Not Present Final   ??? Hypochromasia 10/16/2019 Marked* Not Present Final   ??? Smear Review Comments 10/16/2019 See Comment* Undefined Final    Slide reviewed. Promyelocytes present- rare. Myelocytes present.     ??? Polychromasia 10/16/2019 Slight* Not Present Final   ??? Ovalocytes 10/16/2019 Moderate* Not Present Final   ??? Tear Drop Cells 10/16/2019 Moderate* Not Present Final   ??? Poikilocytosis 10/16/2019 Moderate* Not Present Final   ??? Pathologist Smear Interpretation 10/16/2019 Confirmed by Hemepath Specialist/Senior Tech  Confirmed by Hemepath Fellow, Confirmed by Hemepath Specialist/Senior Tech, Confirmed by Core Specialist/Senior Tech, To Be Accessioned - Case Report to Follow Final        Approximate telephone time spent with patient: 15 minutes     Audie Box, PharmD, BCOP, CPP  Clinical Pharmacist Practitioner, Benign Hematology

## 2019-11-22 LAB — CBC W/ DIFFERENTIAL
BASOPHILS ABSOLUTE COUNT: 0.2 10*3/uL (ref 0.0–0.2)
BASOPHILS RELATIVE PERCENT: 1 %
EOSINOPHILS ABSOLUTE COUNT: 0.2 10*3/uL (ref 0.0–0.4)
EOSINOPHILS RELATIVE PERCENT: 1 %
HEMATOCRIT: 32.2 % — ABNORMAL LOW (ref 34.0–46.6)
HEMOGLOBIN: 10.1 g/dL — ABNORMAL LOW (ref 11.1–15.9)
LYMPHOCYTES ABSOLUTE COUNT: 1.2 10*3/uL (ref 0.7–3.1)
LYMPHOCYTES RELATIVE PERCENT: 6 %
MEAN CORPUSCULAR HEMOGLOBIN CONC: 31.4 g/dL — ABNORMAL LOW (ref 31.5–35.7)
MEAN CORPUSCULAR HEMOGLOBIN: 25 pg — ABNORMAL LOW (ref 26.6–33.0)
MEAN CORPUSCULAR VOLUME: 80 fL (ref 79–97)
MONOCYTES ABSOLUTE COUNT: 0.4 10*3/uL (ref 0.1–0.9)
MONOCYTES RELATIVE PERCENT: 2 %
NEUTROPHILS ABSOLUTE COUNT: 16.6 10*3/uL — ABNORMAL HIGH (ref 1.4–7.0)
NEUTROPHILS RELATIVE PERCENT: 82 %
NUCLEATED RED BLOOD CELLS: 3 % — ABNORMAL HIGH (ref 0–0)
PLATELET COUNT: 250 10*3/uL (ref 150–450)
RED BLOOD CELL COUNT: 4.04 x10E6/uL (ref 3.77–5.28)
RED CELL DISTRIBUTION WIDTH: 18.7 % — ABNORMAL HIGH (ref 11.7–15.4)
WHITE BLOOD CELL COUNT: 20.2 10*3/uL (ref 3.4–10.8)

## 2019-11-22 LAB — COMPREHENSIVE METABOLIC PANEL
A/G RATIO: 2.4 — ABNORMAL HIGH (ref 1.2–2.2)
ALBUMIN: 4.7 g/dL (ref 3.8–4.8)
ALKALINE PHOSPHATASE: 51 IU/L (ref 44–121)
ALT (SGPT): 32 IU/L (ref 0–32)
AST (SGOT): 32 IU/L (ref 0–40)
BILIRUBIN TOTAL: 0.5 mg/dL (ref 0.0–1.2)
BLOOD UREA NITROGEN: 17 mg/dL (ref 6–24)
BUN / CREAT RATIO: 24 — ABNORMAL HIGH (ref 9–23)
CALCIUM: 8.4 mg/dL — ABNORMAL LOW (ref 8.7–10.2)
CHLORIDE: 103 mmol/L (ref 96–106)
CO2: 25 mmol/L (ref 20–29)
CREATININE: 0.72 mg/dL (ref 0.57–1.00)
GFR MDRD AF AMER: 116 mL/min/{1.73_m2}
GFR MDRD NON AF AMER: 101 mL/min/{1.73_m2}
GLOBULIN, TOTAL: 2 g/dL (ref 1.5–4.5)
GLUCOSE: 79 mg/dL (ref 65–99)
POTASSIUM: 4.6 mmol/L (ref 3.5–5.2)
SODIUM: 138 mmol/L (ref 134–144)
TOTAL PROTEIN: 6.7 g/dL (ref 6.0–8.5)

## 2019-11-22 LAB — IMMATURE CELLS
BLASTS/BLAST LIKE CELLS: 1 % — ABNORMAL HIGH (ref 0–0)
METAMYLOCYTES-LABCORP: 4 % — ABNORMAL HIGH (ref 0–0)
MYELOCYTES: 3 % — ABNORMAL HIGH (ref 0–0)

## 2019-11-22 LAB — C-REACTIVE PROTEIN: C-REACTIVE PROTEIN: <1 mg/L (ref 0–10)

## 2019-11-23 DIAGNOSIS — D471 Chronic myeloproliferative disease: Principal | ICD-10-CM

## 2019-11-23 MED ORDER — RUXOLITINIB 10 MG TABLET
ORAL_TABLET | Freq: Two times a day (BID) | ORAL | 2 refills | 30.00000 days | Status: CP
Start: 2019-11-23 — End: 2020-02-06
  Filled 2019-11-28: qty 60, 30d supply, fill #0

## 2019-11-24 NOTE — Unmapped (Signed)
Clinical Assessment Needed For: Dose Change  Medication: Jakafi 10mg  tablet  Last Fill Date/Day Supply: 11/20/2019 / 30 days  Copay $0  Was previous dose already scheduled to fill: No    Notes to Pharmacist:

## 2019-11-24 NOTE — Unmapped (Signed)
Lab cxld

## 2019-11-27 ENCOUNTER — Encounter
Admit: 2019-11-27 | Discharge: 2019-11-28 | Payer: PRIVATE HEALTH INSURANCE | Attending: Adult Health | Primary: Adult Health

## 2019-11-27 DIAGNOSIS — Z7982 Long term (current) use of aspirin: Principal | ICD-10-CM

## 2019-11-27 DIAGNOSIS — D7581 Myelofibrosis: Principal | ICD-10-CM

## 2019-11-27 DIAGNOSIS — R161 Splenomegaly, not elsewhere classified: Principal | ICD-10-CM

## 2019-11-27 DIAGNOSIS — D509 Iron deficiency anemia, unspecified: Principal | ICD-10-CM

## 2019-11-27 DIAGNOSIS — D473 Essential (hemorrhagic) thrombocythemia: Principal | ICD-10-CM

## 2019-11-27 DIAGNOSIS — Z79899 Other long term (current) drug therapy: Principal | ICD-10-CM

## 2019-11-27 NOTE — Unmapped (Signed)
Myeloproliferative Neoplasms & Bone Marrow Failure Clinic Follow Up Visit    Patient: Mary Navarro  MRN: [id number]  DOB: [birth year]  Date of Visit: 11/27/2019    Referred By: Vernie Murders, AGNP  [address]  [id number]  [place],  [place] [address]    Reason for Visit   Zuleika Gallus is a 49 y.o. seen in follow up of post-essential thrombocythemia myelofibrosis.     Assessment   #1 Post-ET myelofibrosis  #2 Iron deficiency - ferritin 6.4  #3 anemia related to #1 and #2  #4 Splenomegaly related to #1  #5 Rash - not present today    Doing fairly well overall.  She had great spleen response with 20mg  BID.  However, she had significant side effects that were debilitating.  Her dose was reduced to 20mg  PO daily while she awaited the 10mg  tablets for 10mg  BID dosing.  She has noticed that after she decreased her dose, she started to be able to feel her spleen again, which was discouraging to her.  For now, we will plan to continue with 10mg  BID and hopefully this should continue to shrink her spleen.  Her side effect symptoms have completely resolved.    If spleen size increases over the next 2 weeks, will have to increase to a 10mg /20mg  split dosing.        Plan   1. Decrease Jakafi to 10mg  PO BID  2. Labs in 2 weeks at [facility]   3. Follow up in 1 month with labs    Markus Jarvis, RN, MSN, AGPCNP-C  Nurse Practitioner  Hematologic Malignancies  [facility]  [phone] (phone)  [phone] (fax)  [email or web address]      I personally spent 45 minutes face-to-face and non-face-to-face in the care of this patient, which includes all pre, intra, and post visit time on the date of service.        Interval History  Started on 10/26/19.  Reported bone pain, nausea, and vomiting while on 20mg  PO bid.  Spleen shrunk with this.  However, when she decreased dose her symptoms resolved, but her spleen size increased.   Has not gotten the 10mg  tablets yet.  New bottle was sent on 11/8, but that was the 20mg  tabs.  She has been breaking the tablets in half.  Elbow pain is still present.  Under left rib, it is tender, the shock feeling is less intense.  She also says that she's doing more exercise so she thinks that could be contributing.      Otherwise, denies new bone pain, fevers, chills, night sweats, lumps/bumps, tongue swelling, shortness of breath, syncope, lightheadedness, constipation or diarrhea, nausea or vomiting, very easy bruising or bleeding, or urinary changes.       History of Present Illness   Dx = Post-ET myelofibrosis, diagnosed 06/20/2019.    Patient was originally diagnosed with essential thrombocythemia in 5 at the age of 69 when routine check pre-Accutane.  Platelets were >1 million.   Had two children without pregnancy complications after the diagnosis.      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 Abdominal ultrasound from 11/22/2015 shows spleen 23 cm.   B) Bone pain  - shins are very tender all the time.   C) Iron deficiency   D) rash - urticaria perhaps heat-related;  erythematous, blanchable rash on lower extremities      Prognostic Risk Classification:  DIPSS-Plus: Intermediate-1 (anemia, hgb <10)  MIPSS70v2.0: Low risk (but incomplete data).  Corresponds to median overall survival of 16.4 years. Potential for intermediate risk disease if HMR category, median overall survival 7.7 years.    MYSEC-PM: Low risk    Allogenic Stem Cell Transplant Assessment:  MTSS Score for allogenic stem cell transplant (Blood (2019) 133 (20): 2233???2242.):  0 points (but incomplete data) = low-risk.  Very high benefit from allogeneic stem cell transplant, minimal risk.         Treatment History/Disease Course:  A. Aspirin alone - stopped ~1997  B. Observation alone.    CEarvin Hansen 20mg  PO BID - started 10/2019      Review of Systems   10 systems reviewed and negative except as noted in the History of Present Illness.    Medications     Current Outpatient Medications   Medication Sig Dispense Refill   ??? ASHWAGANDHA ROOT EXTRACT,BULK, MISC      ??? aspirin (ECOTRIN) 81 MG tablet 1 tablet     ??? BORIC ACID MISC Compounded Boric Acid 600mg  vaginal capsules     ??? cetirizine (ZYRTEC) 10 MG tablet Take 10 mg by mouth.     ??? cholecalciferol, vitamin D3-25 mcg, 1,000 unit,, (VITAMIN D3) 25 mcg (1,000 unit) capsule Take 25 mcg by mouth daily.     ??? ferrous sulfate 324 mg (65 mg iron) EC tablet      ??? fluorouraciL (EFUDEX) 5 % cream fluorouracil 5 % topical cream     ??? fluticasone propionate (FLONASE) 50 mcg/actuation nasal spray 1 spray into each nostril.     ??? multivit-min/ferrous fumarate (MULTI VITAMIN ORAL)      ??? olopatadine (PAZEO) 0.7 % ophthalmic solution Pazeo 0.7 % eye drops   INSTILL 1 DROP INTO BOTH EYES EVERY DAY AS NEEDED     ??? promethazine-dextromethorphan (PROMETHAZINE-DM) 6.25-15 mg/5 mL syrup      ??? ruxolitinib (JAKAFI) 20 mg tablet Take 20 mg by mouth Two (2) times a day.     ??? sertraline HCl (ZOLOFT ORAL)      ??? traMADoL (ULTRAM) 50 mg tablet Take 25-50 mg by mouth.     ??? zinc gluconate 50 mg (7 mg elemental zinc) tablet Take 50 mg by mouth daily.     ??? ruxolitinib (JAKAFI) 10 mg tablet Take 1 tablet (10 mg total) by mouth Two (2) times a day. (Patient not taking: Reported on 11/27/2019) 60 tablet 2     No current facility-administered medications for this visit.       Allergies     Allergies   Allergen Reactions   ??? Doxycycline Hyclate      Other reaction(s): stomach upset   ??? Hydrocodone-Homatropine      Other reaction(s): itching   ??? Paroxetine Hcl Other (See Comments)     Other reaction(s): Other (See Comments)  Made her dazed  Made her dazed    Other reaction(s): fatigue/not effective   ??? Doxycycline Nausea Only     Stomach cramps  Stomach cramps         Past Medical and Surgical History     Past Medical History:   Diagnosis Date   ??? Myelofibrosis (CMS-HCC) 07/04/2019     No past surgical history on file.    Social History     Social History     Socioeconomic History   ??? Marital status: Married  Spouse name: Not on file   ??? Number of children: Not on file   ??? Years of education: Not on file   ??? Highest education level: Not on file   Occupational History   ??? Not on file   Tobacco Use   ??? Smoking status: Never Smoker   ??? Smokeless tobacco: Never Used   Substance and Sexual Activity   ??? Alcohol use: Yes     Alcohol/week: 4.0 standard drinks     Types: 4 Cans of beer per week   ??? Drug use: Not on file   ??? Sexual activity: Not on file   Other Topics Concern   ??? Not on file   Social History Narrative   ??? 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 Husband is physical therapist.     Family History   2 children - son (19yo) and daughter (41yo)  Twin sister and older sister - twin sister with significant OA and has had a hip and knee replacement.  Melissa older sister in good health.      Mom - breast cancer at 46yo - negative BRCA testing  Maternal grandmother - breast cancer at 70yo    Physical Examination     Vitals:    11/27/19 1006   BP: 128/88   Pulse: 76   Resp: 15   Temp: 36.7 ??C (98 ??F)   TempSrc: Temporal   SpO2: 100%   Weight: 73.1 kg (161 lb 1.6 oz)   Height: 166.4 cm (5' 5.51)     GENERAL:  Well-appearing and in no apparent distress.  No apparent dysmorphia. Accompanied by her twin sister.   HEENT:  Anicteric sclera.  No oropharyngeal ulceration nor erythema.  NECK:  No thyromegaly nor masses.  LYMPH: No cervical, supraclavicular, axillary, epitrochlear adenopathy.  CV:  RRR.  S1, S2.  No m/r/g.  LUNGS: CTA bilaterally.  No adventitious sounds. GI:  Soft, nt/nd.  No hepatomegaly.  +splenomegaly 7cm below the LCM. Normoactive bowel sounds.   EXTREMITIES:  No clubbing, cyanosis, nor edema.  NEURO:  AO X 4. CN II-XII grossly intact and symmetric.  Gait is examined and normal.  SKIN: No petechiae nor purpura.  No rashes today.   PSYCH:  Mood is great, affect is full and congruent.  No psychomotor agitation nor retardation.  Thought process is goal-directed and linear.      Laboratory Testing and Imaging     Results for orders placed or performed in visit on 11/20/19   CBC w/ Differential   Result Value Ref Range    WBC 20.2 (HH) 3.4 - 10.8 x10E3/uL    RBC 4.04 3.77 - 5.28 x10E6/uL    HGB 10.1 (L) 11.1 - 15.9 g/dL    HCT 16.1 (L) 09.6 - 46.6 %    MCV 80 79 - 97 fL    MCH 25.0 (L) 26.6 - 33.0 pg    MCHC 31.4 (L) 31.5 - 35.7 g/dL    RDW 04.5 (H) 40.9 - 15.4 %    Platelet 250 150 - 450 x10E3/uL    Neutrophils % 82 Not Estab. %  Lymphocytes % 6 Not Estab. %    Monocytes % 2 Not Estab. %    Eosinophils % 1 Not Estab. %    Basophils % 1 Not Estab. %    Immature Cells Note     Absolute Neutrophils 16.6 (H) 1.4 - 7.0 x10E3/uL    Absolute Lymphocytes 1.2 0.7 - 3.1 x10E3/uL    Absolute Monocytes  0.4 0.1 - 0.9 x10E3/uL    Absolute Eosinophils 0.2 0.0 - 0.4 x10E3/uL    Absolute Basophils  0.2 0.0 - 0.2 x10E3/uL    Nucleated RBC 3 (H) 0 - 0 %    Hematology Comments: Note:    Comprehensive Metabolic Panel   Result Value Ref Range    Glucose 79 65 - 99 mg/dL    BUN 17 6 - 24 mg/dL    Creatinine 1.61 0.96 - 1.00 mg/dL    GFR MDRD Non Af Amer 101 >59 mL/min/1.73    GFR MDRD Af Amer 116 >59 mL/min/1.73    BUN/Creatinine Ratio 24 (H) 9 - 23    Sodium 138 134 - 144 mmol/L    Potassium 4.6 3.5 - 5.2 mmol/L    Chloride 103 96 - 106 mmol/L    CO2 25 20 - 29 mmol/L    Calcium 8.4 (L) 8.7 - 10.2 mg/dL    Total Protein 6.7 6.0 - 8.5 g/dL    Albumin 4.7 3.8 - 4.8 g/dL    Globulin, Total 2.0 1.5 - 4.5 g/dL    A/G Ratio 2.4 (H) 1.2 - 2.2    Total Bilirubin 0.5 0.0 - 1.2 mg/dL Alkaline Phosphatase 51 44 - 121 IU/L    AST 32 0 - 40 IU/L    ALT 32 0 - 32 IU/L   C-reactive protein   Result Value Ref Range    CRP <1 0 - 10 mg/L   Immature Cells   Result Value Ref Range    Metamylocytes 4 (H) 0 - 0 %    Myelocytes 3 (H) 0 - 0 %    Blasts/blast like cells 1 (H) 0 - 0 %

## 2019-11-27 NOTE — Unmapped (Signed)
Get your labs checked at [facility] in 2 weeks.  We'll see you in clinic with labs in 4 weeks.    Please return on 12/20 as scheduled for a follow up visit and labs.    If you have any questions, please do not hesitate to contact us.    If you experience new or worsening of the following symptoms, please call:  1. Night sweats  2. Abdominal discomfort in the left upper quadrant  3. Feeling full early  4. Excessive fatigue  5. Unintentional weight loss  6. Bone pain  7. Itching after hot showers    When reviewing your results, please remember that the results of many of the tests we order can vary somewhat and that variation often means nothing.  Sometimes when we get results back after your clinic visit, if it looks like there???s some variation of that type, we may decide to recheck things sooner than we discussed in clinic.  If you get a call that we want to recheck things sooner, do not panic. It does not mean that things are going wrong.    For appointments & questions Monday through Friday 8 AM??? 5 PM   please call [phone] or Toll free [phone].    On Nights, Weekends and Holidays  Call [phone] and ask for the adult hematologist/oncologist on call.    Markus Jarvis, AGPCNP-C, MSN, OCN  Nurse Practitioner  Hematologic Malignancies  [facility]    Nurse Navigator: Wynona Meals RN  Questions and appointments M-F 8am - 5pm: [phone] or [phone]    [facility]  [address]  [place], [place] [address]  [email or web address]    Results for orders placed or performed in visit on 11/20/19   CBC w/ Differential   Result Value Ref Range    WBC 20.2 (HH) 3.4 - 10.8 x10E3/uL    RBC 4.04 3.77 - 5.28 x10E6/uL    HGB 10.1 (L) 11.1 - 15.9 g/dL    HCT 36.6 (L) 44.0 - 46.6 %    MCV 80 79 - 97 fL    MCH 25.0 (L) 26.6 - 33.0 pg    MCHC 31.4 (L) 31.5 - 35.7 g/dL    RDW 34.7 (H) 42.5 - 15.4 %    Platelet 250 150 - 450 x10E3/uL    Neutrophils % 82 Not Estab. %    Lymphocytes % 6 Not Estab. % Monocytes % 2 Not Estab. %    Eosinophils % 1 Not Estab. %    Basophils % 1 Not Estab. %    Immature Cells Note     Absolute Neutrophils 16.6 (H) 1.4 - 7.0 x10E3/uL    Absolute Lymphocytes 1.2 0.7 - 3.1 x10E3/uL    Absolute Monocytes  0.4 0.1 - 0.9 x10E3/uL    Absolute Eosinophils 0.2 0.0 - 0.4 x10E3/uL    Absolute Basophils  0.2 0.0 - 0.2 x10E3/uL    Nucleated RBC 3 (H) 0 - 0 %    Hematology Comments: Note:    Comprehensive Metabolic Panel   Result Value Ref Range    Glucose 79 65 - 99 mg/dL    BUN 17 6 - 24 mg/dL    Creatinine 9.56 3.87 - 1.00 mg/dL    GFR MDRD Non Af Amer 101 >59 mL/min/1.73    GFR MDRD Af Amer 116 >59 mL/min/1.73    BUN/Creatinine Ratio 24 (H) 9 - 23    Sodium 138 134 - 144 mmol/L    Potassium 4.6 3.5 -  5.2 mmol/L    Chloride 103 96 - 106 mmol/L    CO2 25 20 - 29 mmol/L    Calcium 8.4 (L) 8.7 - 10.2 mg/dL    Total Protein 6.7 6.0 - 8.5 g/dL    Albumin 4.7 3.8 - 4.8 g/dL    Globulin, Total 2.0 1.5 - 4.5 g/dL    A/G Ratio 2.4 (H) 1.2 - 2.2    Total Bilirubin 0.5 0.0 - 1.2 mg/dL    Alkaline Phosphatase 51 44 - 121 IU/L    AST 32 0 - 40 IU/L    ALT 32 0 - 32 IU/L   C-reactive protein   Result Value Ref Range    CRP <1 0 - 10 mg/L   Immature Cells   Result Value Ref Range    Metamylocytes 4 (H) 0 - 0 %    Myelocytes 3 (H) 0 - 0 %    Blasts/blast like cells 1 (H) 0 - 0 %

## 2019-11-28 MED FILL — JAKAFI 10 MG TABLET: 30 days supply | Qty: 60 | Fill #0 | Status: AC

## 2019-11-28 NOTE — Unmapped (Signed)
[facility] Clinical Assessment & Refill Coordination Note    Mary Navarro [place], DOB: [birth year]  Phone: [phone] (home)     All above HIPAA information was verified with patient.     Was a Nurse, learning disability used for this call? No    Specialty Medication(s):   Hematology/Oncology: [id number]     Current Outpatient Medications   Medication Sig Dispense Refill   ??? ASHWAGANDHA ROOT EXTRACT,BULK, MISC      ??? aspirin (ECOTRIN) 81 MG tablet 1 tablet     ??? BORIC ACID MISC Compounded Boric Acid 600mg  vaginal capsules     ??? cetirizine (ZYRTEC) 10 MG tablet Take 10 mg by mouth.     ??? cholecalciferol, vitamin D3-25 mcg, 1,000 unit,, (VITAMIN D3) 25 mcg (1,000 unit) capsule Take 25 mcg by mouth daily.     ??? ferrous sulfate 324 mg (65 mg iron) EC tablet      ??? fluorouraciL (EFUDEX) 5 % cream fluorouracil 5 % topical cream     ??? fluticasone propionate (FLONASE) 50 mcg/actuation nasal spray 1 spray into each nostril.     ??? multivit-min/ferrous fumarate (MULTI VITAMIN ORAL)      ??? olopatadine (PAZEO) 0.7 % ophthalmic solution Pazeo 0.7 % eye drops   INSTILL 1 DROP INTO BOTH EYES EVERY DAY AS NEEDED     ??? promethazine-dextromethorphan (PROMETHAZINE-DM) 6.25-15 mg/5 mL syrup      ??? ruxolitinib (JAKAFI) 10 mg tablet Take 1 tablet (10 mg total) by mouth Two (2) times a day. (Patient not taking: Reported on 11/27/2019) 60 tablet 2   ??? ruxolitinib (JAKAFI) 20 mg tablet Take 20 mg by mouth Two (2) times a day.     ??? sertraline HCl (ZOLOFT ORAL)      ??? traMADoL (ULTRAM) 50 mg tablet Take 25-50 mg by mouth.     ??? zinc gluconate 50 mg (7 mg elemental zinc) tablet Take 50 mg by mouth daily.       No current facility-administered medications for this visit.        Changes to medications: Kandra reports no changes at this time.    Allergies   Allergen Reactions   ??? Doxycycline Hyclate      Other reaction(s): stomach upset   ??? Hydrocodone-Homatropine      Other reaction(s): itching   ??? Paroxetine Hcl Other (See Comments)     Other reaction(s): Other (See Comments)  Made her dazed  Made her dazed    Other reaction(s): fatigue/not effective   ??? Doxycycline Nausea Only     Stomach cramps  Stomach cramps         Changes to allergies: No    SPECIALTY MEDICATION ADHERENCE     Jakafi 10 mg: 0 days of medicine on hand     Medication Adherence    Patient reported X missed doses in the last month: 0  Specialty Medication: Jakafi 10 mg BID (dose decreased)  Patient is on additional specialty medications: No  Informant: patient  Confirmed plan for next specialty medication refill: delivery by pharmacy          Specialty medication(s) dose(s) confirmed: Dose decrease: now taking Jakafi 10 mg BID     Are there any concerns with adherence? No    Adherence counseling provided? Not needed    CLINICAL MANAGEMENT AND INTERVENTION      Clinical Benefit Assessment:    Do you feel the medicine is effective or helping your condition? Yes  Clinical Benefit counseling provided? Not needed    Adverse Effects Assessment:    Are you experiencing any side effects? Did not discuss.  She reported speaking with the clinic recently and that she did not have any other concerns    Are you experiencing difficulty administering your medicine? No    Quality of Life Assessment:    How many days over the past month did your Primary myelofibrosis  keep you from your normal activities? For example, brushing your teeth or getting up in the morning. 0    Have you discussed this with your provider? Not needed    Therapy Appropriateness:    Is therapy appropriate? Yes, therapy is appropriate and should be continued    DISEASE/MEDICATION-SPECIFIC INFORMATION      N/A    PATIENT SPECIFIC NEEDS     - Does the patient have any physical, cognitive, or cultural barriers? No    - Is the patient high risk? Yes, patient is taking oral chemotherapy. Appropriateness of therapy as been assessed    - Does the patient require a Care Management Plan? No     - Does the patient require physician intervention or other additional services (i.e. nutrition, smoking cessation, social work)? No      SHIPPING     Specialty Medication(s) to be Shipped:   Hematology/Oncology: Jakafi    Other medication(s) to be shipped: No additional medications requested for fill at this time     Changes to insurance: No    Delivery Scheduled: Yes, Expected medication delivery date: 11/29/19.     Medication will be delivered via UPS to the confirmed prescription address in [facility].    The patient will receive a drug information handout for each medication shipped and additional FDA Medication Guides as required.  Verified that patient has previously received a Conservation officer, historic buildings.    All of the patient's questions and concerns have been addressed.    Breck Coons [facility] Specialty Pharmacist

## 2019-12-01 MED ORDER — CYCLOBENZAPRINE 5 MG TABLET
0 days
Start: 2019-12-01 — End: ?

## 2019-12-11 DIAGNOSIS — D471 Chronic myeloproliferative disease: Principal | ICD-10-CM

## 2019-12-11 NOTE — Unmapped (Signed)
CBC and CMP orders entered and sent to [facility]

## 2019-12-11 NOTE — Unmapped (Signed)
Labs for [facility]

## 2019-12-11 NOTE — Unmapped (Signed)
Mary Navarro contacted the Communication Center requesting to speak with the care team of Mary Navarro to discuss:    Needs her blood work orders sent to the [facility] to include CBC and CMP.  She is there now.    Please contact at [phone].    Program: Heme Malignancy  Speciality: Medical Oncology    Check Indicates criteria has been reviewed and confirmed with the patient:    []  Preferred Name   [x]  DOB and/or MR#  [x]  Preferred Contact Method  [x]  Phone Number(s)   []  MyChart     Thank you,   Vernie Ammons  [facility]   [phone]

## 2019-12-12 LAB — IMMATURE CELLS
BANDS: 4 %
METAMYLOCYTES-LABCORP: 4 % — ABNORMAL HIGH (ref 0–0)

## 2019-12-12 LAB — COMPREHENSIVE METABOLIC PANEL
A/G RATIO: 3.1 — ABNORMAL HIGH (ref 1.2–2.2)
ALBUMIN: 5.2 g/dL — ABNORMAL HIGH (ref 3.8–4.8)
ALKALINE PHOSPHATASE: 51 IU/L (ref 44–121)
ALT (SGPT): 23 IU/L (ref 0–32)
AST (SGOT): 27 IU/L (ref 0–40)
BILIRUBIN TOTAL: 0.4 mg/dL (ref 0.0–1.2)
BLOOD UREA NITROGEN: 25 mg/dL — ABNORMAL HIGH (ref 6–24)
BUN / CREAT RATIO: 33 — ABNORMAL HIGH (ref 9–23)
CALCIUM: 8.9 mg/dL (ref 8.7–10.2)
CHLORIDE: 102 mmol/L (ref 96–106)
CO2: 21 mmol/L (ref 20–29)
CREATININE: 0.75 mg/dL (ref 0.57–1.00)
GFR MDRD AF AMER: 111 mL/min/{1.73_m2}
GFR MDRD NON AF AMER: 96 mL/min/{1.73_m2}
GLOBULIN, TOTAL: 1.7 g/dL (ref 1.5–4.5)
GLUCOSE: 85 mg/dL (ref 65–99)
POTASSIUM: 4.4 mmol/L (ref 3.5–5.2)
SODIUM: 138 mmol/L (ref 134–144)
TOTAL PROTEIN: 6.9 g/dL (ref 6.0–8.5)

## 2019-12-12 LAB — CBC W/ DIFFERENTIAL
BASOPHILS ABSOLUTE COUNT: 0.8 10*3/uL — ABNORMAL HIGH (ref 0.0–0.2)
BASOPHILS RELATIVE PERCENT: 3 %
EOSINOPHILS ABSOLUTE COUNT: 1 10*3/uL — ABNORMAL HIGH (ref 0.0–0.4)
EOSINOPHILS RELATIVE PERCENT: 4 %
HEMATOCRIT: 35.3 % (ref 34.0–46.6)
HEMOGLOBIN: 11.3 g/dL (ref 11.1–15.9)
LYMPHOCYTES ABSOLUTE COUNT: 2.3 10*3/uL (ref 0.7–3.1)
LYMPHOCYTES RELATIVE PERCENT: 9 %
MEAN CORPUSCULAR HEMOGLOBIN CONC: 32 g/dL (ref 31.5–35.7)
MEAN CORPUSCULAR HEMOGLOBIN: 25.3 pg — ABNORMAL LOW (ref 26.6–33.0)
MEAN CORPUSCULAR VOLUME: 79 fL (ref 79–97)
MONOCYTES ABSOLUTE COUNT: 0.8 10*3/uL (ref 0.1–0.9)
MONOCYTES RELATIVE PERCENT: 3 %
NEUTROPHILS ABSOLUTE COUNT: 20 10*3/uL — ABNORMAL HIGH (ref 1.4–7.0)
NEUTROPHILS RELATIVE PERCENT: 73 %
NUCLEATED RED BLOOD CELLS: 2 % — ABNORMAL HIGH (ref 0–0)
PLATELET COUNT: 382 10*3/uL (ref 150–450)
RED BLOOD CELL COUNT: 4.47 x10E6/uL (ref 3.77–5.28)
RED CELL DISTRIBUTION WIDTH: 19.5 % — ABNORMAL HIGH (ref 11.7–15.4)
WHITE BLOOD CELL COUNT: 26 10*3/uL (ref 3.4–10.8)

## 2019-12-12 LAB — IRON & TIBC
IRON SATURATION: 35 % (ref 15–55)
IRON: 80 ug/dL (ref 27–159)
TOTAL IRON BINDING CAPACITY: 231 ug/dL — ABNORMAL LOW (ref 250–450)
UNSATURATED IRON BINDING CAPACITY: 151 ug/dL (ref 131–425)

## 2019-12-12 LAB — FERRITIN: FERRITIN: 553 ng/mL — ABNORMAL HIGH (ref 15–150)

## 2019-12-12 LAB — TRIGLYCERIDES: TRIGLYCERIDES: 73 mg/dL (ref 0–149)

## 2019-12-15 NOTE — Unmapped (Signed)
[facility] Refill Coordination Note    Specialty Medication(s) to be Shipped:   Hematology/Oncology: Mary Navarro    Other medication(s) to be shipped: No additional medications requested for fill at this time     Mary Navarro, DOB: 11-03-1970  Phone: [phone] (home)       All above HIPAA information was verified with patient.     Was a Nurse, learning disability used for this call? No    Completed refill call assessment today to schedule patient's medication shipment from the [facility] [phone]).       Specialty medication(s) and dose(s) confirmed: Regimen is correct and unchanged.   Changes to medications: Tenzin reports no changes at this time.  Changes to insurance: No  Questions for the pharmacist: No    Confirmed patient received Welcome Packet with first shipment. The patient will receive a drug information handout for each medication shipped and additional FDA Medication Guides as required.       DISEASE/MEDICATION-SPECIFIC INFORMATION        N/A    SPECIALTY MEDICATION ADHERENCE     Medication Adherence    Patient reported X missed doses in the last month: 0  Specialty Medication: Jakafi 10mg   Patient is on additional specialty medications: No  Informant: patient                Jakafi 10 mg: 14 days of medicine on hand         SHIPPING     Shipping address confirmed in Epic.     Delivery Scheduled: Yes, Expected medication delivery date: 12/27/19.     Medication will be delivered via UPS to the prescription address in [facility] [place].    [name]   [facility] Specialty Technician

## 2019-12-18 DIAGNOSIS — D471 Chronic myeloproliferative disease: Principal | ICD-10-CM

## 2019-12-26 MED FILL — JAKAFI 10 MG TABLET: 30 days supply | Qty: 60 | Fill #1 | Status: AC

## 2019-12-26 MED FILL — JAKAFI 10 MG TABLET: ORAL | 30 days supply | Qty: 60 | Fill #1

## 2020-01-01 ENCOUNTER — Encounter: Admit: 2020-01-01 | Discharge: 2020-01-02 | Payer: PRIVATE HEALTH INSURANCE

## 2020-01-01 ENCOUNTER — Ambulatory Visit
Admit: 2020-01-01 | Discharge: 2020-01-02 | Payer: PRIVATE HEALTH INSURANCE | Attending: Internal Medicine | Primary: Internal Medicine

## 2020-01-01 DIAGNOSIS — K219 Gastro-esophageal reflux disease without esophagitis: Principal | ICD-10-CM

## 2020-01-01 DIAGNOSIS — Z79899 Other long term (current) drug therapy: Principal | ICD-10-CM

## 2020-01-01 DIAGNOSIS — R162 Hepatomegaly with splenomegaly, not elsewhere classified: Principal | ICD-10-CM

## 2020-01-01 DIAGNOSIS — D7581 Myelofibrosis: Principal | ICD-10-CM

## 2020-01-01 DIAGNOSIS — K224 Dyskinesia of esophagus: Principal | ICD-10-CM

## 2020-01-01 DIAGNOSIS — R161 Splenomegaly, not elsewhere classified: Principal | ICD-10-CM

## 2020-01-01 DIAGNOSIS — E611 Iron deficiency: Principal | ICD-10-CM

## 2020-01-01 DIAGNOSIS — D649 Anemia, unspecified: Principal | ICD-10-CM

## 2020-01-01 DIAGNOSIS — D471 Chronic myeloproliferative disease: Principal | ICD-10-CM

## 2020-01-01 LAB — COMPREHENSIVE METABOLIC PANEL
ALBUMIN: 4.5 g/dL (ref 3.4–5.0)
ALKALINE PHOSPHATASE: 52 U/L (ref 46–116)
ALT (SGPT): 36 U/L (ref 10–49)
ANION GAP: 5 mmol/L (ref 5–14)
AST (SGOT): 42 U/L — ABNORMAL HIGH (ref <=34)
BILIRUBIN TOTAL: 1.1 mg/dL (ref 0.3–1.2)
BLOOD UREA NITROGEN: 15 mg/dL (ref 9–23)
BUN / CREAT RATIO: 24
CALCIUM: 9.6 mg/dL (ref 8.7–10.4)
CHLORIDE: 106 mmol/L (ref 98–107)
CO2: 28 mmol/L (ref 20.0–31.0)
CREATININE: 0.63 mg/dL
EGFR CKD-EPI AA FEMALE: >90 mL/min/{1.73_m2} (ref >=60)
EGFR CKD-EPI NON-AA FEMALE: >90 mL/min/{1.73_m2} (ref >=60)
GLUCOSE RANDOM: 92 mg/dL (ref 70–179)
POTASSIUM: 4.2 mmol/L (ref 3.4–4.5)
PROTEIN TOTAL: 7.4 g/dL (ref 5.7–8.2)
SODIUM: 139 mmol/L (ref 135–145)

## 2020-01-01 LAB — LIPID PANEL
CHOLESTEROL/HDL RATIO SCREEN: 1.8 (ref 1.0–4.5)
CHOLESTEROL: 105 mg/dL (ref <=200)
HDL CHOLESTEROL: 59 mg/dL (ref 40–60)
LDL CHOLESTEROL CALCULATED: 30 mg/dL — ABNORMAL LOW (ref 40–99)
NON-HDL CHOLESTEROL: 46 mg/dL — ABNORMAL LOW (ref 70–130)
TRIGLYCERIDES: 78 mg/dL (ref 0–150)
VLDL CHOLESTEROL CAL: 15.6 mg/dL (ref 9–37)

## 2020-01-01 LAB — SLIDE REVIEW

## 2020-01-01 LAB — CBC W/ AUTO DIFF
BASOPHILS ABSOLUTE COUNT: 0.3 10*9/L — ABNORMAL HIGH (ref 0.0–0.1)
BASOPHILS RELATIVE PERCENT: 1.6 %
EOSINOPHILS ABSOLUTE COUNT: 0.2 10*9/L (ref 0.0–0.4)
EOSINOPHILS RELATIVE PERCENT: 1.1 %
HEMATOCRIT: 36.5 % (ref 36.0–46.0)
HEMOGLOBIN: 11.1 g/dL — ABNORMAL LOW (ref 12.0–16.0)
LARGE UNSTAINED CELLS: 2 % (ref 0–4)
LYMPHOCYTES ABSOLUTE COUNT: 1.3 10*9/L — ABNORMAL LOW (ref 1.5–5.0)
LYMPHOCYTES RELATIVE PERCENT: 7.4 %
MEAN CORPUSCULAR HEMOGLOBIN CONC: 30.5 g/dL — ABNORMAL LOW (ref 31.0–37.0)
MEAN CORPUSCULAR HEMOGLOBIN: 26.1 pg (ref 26.0–34.0)
MEAN CORPUSCULAR VOLUME: 85.4 fL (ref 80.0–100.0)
MEAN PLATELET VOLUME: 9.7 fL (ref 7.0–10.0)
MONOCYTES ABSOLUTE COUNT: 0.6 10*9/L (ref 0.2–0.8)
MONOCYTES RELATIVE PERCENT: 3.5 %
NEUTROPHILS ABSOLUTE COUNT: 14.7 10*9/L — ABNORMAL HIGH (ref 2.0–7.5)
NEUTROPHILS RELATIVE PERCENT: 84.5 %
PLATELET COUNT: 271 10*9/L (ref 150–440)
RED BLOOD CELL COUNT: 4.27 10*12/L (ref 4.00–5.20)
RED CELL DISTRIBUTION WIDTH: 20.1 % — ABNORMAL HIGH (ref 12.0–15.0)
WBC ADJUSTED: 17.4 10*9/L — ABNORMAL HIGH (ref 4.5–11.0)

## 2020-01-01 NOTE — Unmapped (Signed)
Myeloproliferative Neoplasms & Bone Marrow Failure Clinic Follow Up Visit    Patient: Mary Navarro  MRN: [id number]  DOB: Apr 29, 1970  Date of Visit: 01/01/2020    Referred By: Vito Berger, MD  [address]  [id number]  [place],  [place] [address]    Reason for Visit   Mary Navarro is a 49 y.o. seen in follow up of post-essential thrombocythemia myelofibrosis.     Assessment   #1 Post-ET myelofibrosis  #2 Iron deficiency - improved, normalized iron stores as of 12/11/19 labs  #3 Anemia related to #1 and #2  #4 Splenomegaly related to #1  #5 Rash - not present today  #6 Hepatomegaly   #7 GERD/Esophageal Spasms     Had been started initially on Ruxolitinib 20mg  BID with excellent splenic response, though required dose reduction secondary to intolerable side effects (bone pain, nausea, emesis). Currently on 10mg  BID with improved side effect profile (ongoing nausea, mild dizziness, joint discomfort predominately in hands) and otherwise ongoing control of symptoms (mild increase in spleen size per patient but based on exam ~18-19cm, which is still a significant improvement from prior to start of therapy with spleen measuring 26.4x19.4x13.2cm then).      Though she is considered lower risk based on prognostic models, her long standing disease and initial lack of disease control with Ruxolitinib were somewhat worrisome. She also harbors an EZH2 mutation, which can be an early mutation in leukemogenesis (Leukemia. 2017 Sep;31(9):1936-1943), though the clinical significance of hers is unclear as it is a missense variant which has yet to be characterized.     Her counts today however are improved with WBC trending down to 17.4 (from 26 previously; remains left shifted) and overall improved symptoms burden as above. Had long discussion with patient regarding the benefits of continued therapy for disease control, and the potential for slowing disease progression given her age. Went over that goal at this stage was to maximize therapy from a disease standpoint, while minimizing side effects, and that we could titrate the dose to such. We will plan on continuing at 10mg  BID of Ruxolitinib for the time being as she has only been on the dose for a month to better evaluate her side effect profile. We will have the patient follow-up in one month for repeat labs and to check in on symptoms at that time. She is aware to contact us in the interim if she is having intolerable side effects as would plan on further dose reduction in that case (7.5mg  BID, or potentially alternating 10mg  BID/5mg  BID on days). Advised continuation of ASA (81mg  daily) as patient has noted an improvement in erythromelalgia symptoms since start of this and Ruxolitinib.     She has previously been seen and evaluated for transplant and will refer back if there are any signs of disease progression to revisit the role of allogeneic transplant in her management.      Plan   1. Continue Ruxolitinib 10mg  BID  2. If worsening side effects will consider further dose reduction of Rux as above  3. Continue ASA 81mg  daily    4. Advised keeping log for presumed esophageal spasms (intermittent choking sensation that is independent of eating/drinking and very transient); continue PPI otherwise  5. RTC in 4 weeks for repeat labs and to reassess side effect burden    Patient seen and discussed with Dr. Anise Salvo.     Mary Mantis, MD, PhD  Hematology/Oncology Fellow  Interval History    Mary Navarro presents to clinic today with her father for follow-up. She notes that she has been doing ok since last seen, though continues to experience side effects that she thinks are attributable to the [facility]. She notes ongoing nausea, but has not had any emesis as she had on the higher dosing. She has not had any abdominal pain. She does feel though that her spleen has gotten larger somewhat since dropping down to the lower dosing but is still nowhere near where it was from before the start of treatment. She has also had dizziness over the last week, which she has not experienced before. Yesterday it was to the point that she didn't feel comfortable driving and is worried if that continues she may not be able to stay on the same dose of Ruxolitinib. She has had mild arthralgias, restricted to her hands.     ROS otherwise notable for a choking sensation that she has experienced near daily. She says it lasts for about a minute and then resolves without intervention. It is not associated with eating or drinking. She has not had any dysphagia or odynophagia. She said the Omeprazole has helped a little bit but the symptoms are not classically what she things of as GERD related. From a symptom standpoint otherwise she says that she had been having pain, redness, and swelling in her toes more frequently, but has not noticed it of late after having started on Ruxolitinib and ASA.       Otherwise, she denies new bone pain, fevers, chills, night sweats, lumps/bumps, tongue swelling, shortness of breath, syncope, lightheadedness, constipation or diarrhea, nausea or vomiting, very easy bruising or bleeding, or urinary changes.     History of Present Illness   Dx = Post-ET myelofibrosis, diagnosed 06/20/2019.    Patient was originally diagnosed with essential thrombocythemia in 51 at the age of 65 when routine check pre-Accutane.  Platelets were >1 million.   Had two children without pregnancy complications after the diagnosis.      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 Abdominal ultrasound from 11/22/2015 shows spleen 23 cm.   B) Bone pain  - shins are very tender all the time.   C) Iron deficiency   D) rash - urticaria perhaps heat-related; erythematous, blanchable rash on lower extremities      Prognostic Risk Classification:  DIPSS-Plus: Intermediate-1 (anemia, hgb <10)  ZOXWR60A5.4: Low risk (but incomplete data).  Corresponds to median overall survival of 16.4 years. Potential for intermediate risk disease if HMR category, median overall survival 7.7 years.    MYSEC-PM: Low risk    Allogenic Stem Cell Transplant Assessment:  MTSS Score for allogenic stem cell transplant (Blood (2019) 133 (20): 2233???2242.):  0 points (but incomplete data) = low-risk.  Very high benefit from allogeneic stem cell transplant, minimal risk.         Treatment History/Disease Course:  A. Aspirin alone - stopped ~1997  B. Observation alone.    CEarvin Hansen 20mg  PO BID - started 10/16/2019->dose reduced to 20mg  daily on 11/21/2019, transitioned to 10mg  BID on 11/27/2019      Review of Systems   10 systems reviewed and negative except as noted in the Interval History/HPI.    Medications     Current Outpatient Medications   Medication Sig Dispense  Refill   ??? ASHWAGANDHA ROOT EXTRACT,BULK, MISC      ??? aspirin (ECOTRIN) 81 MG tablet 1 tablet     ??? BORIC ACID MISC Compounded Boric Acid 600mg  vaginal capsules     ??? cetirizine (ZYRTEC) 10 MG tablet Take 10 mg by mouth.     ??? cholecalciferol, vitamin D3-25 mcg, 1,000 unit,, (VITAMIN D3) 25 mcg (1,000 unit) capsule Take 25 mcg by mouth daily.     ??? cyclobenzaprine (FLEXERIL) 5 MG tablet      ??? ferrous sulfate 324 mg (65 mg iron) EC tablet      ??? fluorouraciL (EFUDEX) 5 % cream fluorouracil 5 % topical cream     ??? fluticasone propionate (FLONASE) 50 mcg/actuation nasal spray 1 spray into each nostril.     ??? multivit-min/ferrous fumarate (MULTI VITAMIN ORAL)      ??? olopatadine (PAZEO) 0.7 % ophthalmic solution Pazeo 0.7 % eye drops   INSTILL 1 DROP INTO BOTH EYES EVERY DAY AS NEEDED     ??? promethazine-dextromethorphan (PROMETHAZINE-DM) 6.25-15 mg/5 mL syrup      ??? ruxolitinib (JAKAFI) 10 mg tablet Take 1 tablet (10 mg total) by mouth Two (2) times a day. (Patient not taking: Reported on 11/27/2019) 60 tablet 2   ??? sertraline HCl (ZOLOFT ORAL)      ??? traMADoL (ULTRAM) 50 mg tablet Take 25-50 mg by mouth.     ??? zinc gluconate 50 mg (7 mg elemental zinc) tablet Take 50 mg by mouth daily.       No current facility-administered medications for this visit.       Allergies     Allergies   Allergen Reactions   ??? Doxycycline Hyclate      Other reaction(s): stomach upset   ??? Hydrocodone-Homatropine      Other reaction(s): itching   ??? Paroxetine Hcl Other (See Comments)     Other reaction(s): Other (See Comments)  Made her dazed  Made her dazed    Other reaction(s): fatigue/not effective   ??? Doxycycline Nausea Only     Stomach cramps  Stomach cramps         Past Medical and Surgical History     Past Medical History:   Diagnosis Date   ??? Myelofibrosis (CMS-HCC) 07/04/2019     No past surgical history on file.    Social History     Social History     Socioeconomic History   ??? Marital status: Married     Spouse name: Not on file   ??? Number of children: Not on file   ??? Years of education: Not on file   ??? Highest education level: Not on file   Occupational History   ??? Not on file   Tobacco Use   ??? Smoking status: Never Smoker   ??? Smokeless tobacco: Never Used   Substance and Sexual Activity   ??? Alcohol use: Yes     Alcohol/week: 4.0 standard drinks     Types: 4 Cans of beer per week   ??? Drug use: Not on file   ??? Sexual activity: Not on file   Other Topics Concern   ??? Not on file   Social History Narrative   ??? 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 Husband is physical therapist.     Family History   2 children - son (19yo) and daughter (51yo)  Twin sister and older sister - twin sister with significant OA and has had a hip and knee replacement.  Melissa older sister in good health.      Mom - breast cancer at 46yo - negative BRCA testing  Maternal grandmother - breast cancer at 70yo    Physical Examination     Vitals:    01/01/20 0934   BP: 135/87   Pulse: 77   Resp: 18   Temp: 36.6 ??C (97.9 ??F)   TempSrc: Temporal   SpO2: 100%   Weight: 74.1 kg (163 lb 4.8 oz)     GENERAL:  In NAD, well appearing, very pleasant   HEENT: No scleral icterus or injection. EOMI.   CV:  Normal rate, regular rhythm, no murmurs.   LUNGS: CTAB, comfortable on RA.   GI:  Soft, non-tender, non-distended. Appreciable splenomegaly 2cm below left costal margin and medially but not reaching midline. Liver edge barely appreciable below right costal margin, smooth.   EXTREMITIES:  No lower extremity edema. Bilateral toes non-tender, and non-erythematous.    NEURO:  AO X 4. No focal deficits.   SKIN: No petechiae, purpura, or rashes.   PSYCH:  Mood is good, congruent affect.     Laboratory Testing and Imaging     Results for orders placed or performed in visit on 01/01/20   Comprehensive Metabolic Panel   Result Value Ref Range    Sodium 139 135 - 145 mmol/L    Potassium 4.2 3.4 - 4.5 mmol/L    Chloride 106 98 - 107 mmol/L    Anion Gap 5 5 - 14 mmol/L    CO2 28.0 20.0 - 31.0 mmol/L    BUN 15 9 - 23 mg/dL    Creatinine 1.61 0.96 - 0.80 mg/dL    BUN/Creatinine Ratio 24     EGFR CKD-EPI Non-African American, Female >90 >=60 mL/min/1.7m2    EGFR CKD-EPI African American, Female >90 >=60 mL/min/1.1m2    Glucose 92 70 - 179 mg/dL    Calcium 9.6 8.7 - 04.5 mg/dL    Albumin 4.5 3.4 - 5.0 g/dL    Total Protein 7.4 5.7 - 8.2 g/dL    Total Bilirubin 1.1 0.3 - 1.2 mg/dL    AST 42 (H) <=40 U/L    ALT 36 10 - 49 U/L    Alkaline Phosphatase 52 46 - 116 U/L   Lipid panel   Result Value Ref Range    Triglycerides 78 0 - 150 mg/dL    Cholesterol 981 <=191 mg/dL    HDL 59 40 - 60 mg/dL    LDL Calculated 30 (L) 40 - 99 mg/dL    VLDL Cholesterol Cal 15.6 9 - 37 mg/dL    Chol/HDL Ratio 1.8 1.0 - 4.5    Non-HDL Cholesterol 46 (L) 70 - 130 mg/dL    FASTING Unknown    CBC w/ Differential   Result Value Ref Range    WBC 17.4 (H) 4.5 - 11.0 10*9/L    RBC 4.27 4.00 - 5.20 10*12/L    HGB 11.1 (L) 12.0 - 16.0 g/dL    HCT 47.8 29.5 - 62.1 %    MCV 85.4 80.0 - 100.0 fL    MCH 26.1 26.0 -  34.0 pg    MCHC 30.5 (L) 31.0 - 37.0 g/dL    RDW 81.1 (H) 91.4 - 15.0 %    MPV 9.7 7.0 - 10.0 fL    Platelet 271 150 - 440 10*9/L    Variable HGB Concentration Slight (A) Not Present    Neutrophils % 84.5 %    Lymphocytes % 7.4 %    Monocytes % 3.5 %    Eosinophils % 1.1 %    Basophils % 1.6 %    Absolute Neutrophils 14.7 (H) 2.0 - 7.5 10*9/L    Absolute Lymphocytes 1.3 (L) 1.5 - 5.0 10*9/L    Absolute Monocytes 0.6 0.2 - 0.8 10*9/L    Absolute Eosinophils 0.2 0.0 - 0.4 10*9/L    Absolute Basophils 0.3 (H) 0.0 - 0.1 10*9/L    Large Unstained Cells 2 0 - 4 %    Microcytosis Slight (A) Not Present    Anisocytosis Moderate (A) Not Present    Hypochromasia Marked (A) Not Present   Morphology Review   Result Value Ref Range    Smear Review Comments See Comment (A) Undefined    Polychromasia Slight (A) Not Present    Ovalocytes Moderate (A) Not Present    Tear Drop Cells Moderate (A) Not Present    Poikilocytosis Moderate (A) Not Present

## 2020-01-01 NOTE — Unmapped (Signed)
I saw and evaluated the patient, participating in the key portions of the service.  I reviewed the resident???s note.  I agree with the resident???s findings and plan.   Ruxolitinib 20mg  po BID started 10/26/2019 for symptomatic splenomegaly.   - 11/21/2019: nausea, emesis, insomnia, ?bone pain - reduced dose to 20mg  total daily.   - 11/28/22021: Reported mouth sores (or angular chelitis?), bloating, gas, difficulty swallowing - ruxolitinib lowered to 10mg  po BID.      With that, has had resolution of GI symptoms.  Does have intermittent dizziness now, but hemoglobin stable.  Splenomegaly is improving.  Discussed that ruxolitinib does is tied to spleen volume reduction which is tied to overall survival. At this point, Mary Navarro is willing to continue on the current dose of ruxolitinib for another 4 weeks to see if the symptoms continue to improve.     Discussed next therapeutic line could be fedratinib (more anticipated GI toxicity), clinical trial (we have Incyte BET inhibitor).  With overall stable disease, would not proceed with transplant at this time, though patient has been seen by Dr. Oswald Hillock should we need this option in the future.      [name], MD  Hematology

## 2020-01-01 NOTE — Unmapped (Unsigned)
Labs drawn peripherally by Marylouise Stacks.

## 2020-01-01 NOTE — Unmapped (Signed)
It was a pleasure seeing you today!    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 Continue your Jakafi at the same dose, 10mg , twice daily  2. Continue your aspirin, 81mg  daily   3. Continue your antacid  4. We will see you back in 4 weeks to repeat labs and see how you are doing; please let us know if you have worsening side effects in the meantime as we can adjust the dose if we need to    Wishing you a very happy holiday season and all the best in the New Year. Please let us know if you have any questions or concerns prior to seeing [place] at your next visit.     [name], M.D.  Assistant Professor  Division of Hematology  ??  Co-PI, [employer] [facility]  Member, [facility]  Member, [facility]    ??  [address]  [place] [address]  [address]  [place] [address]  ??  Questions and appointments M-F 8am - 5pm: [id number] or [phone]  ??  Myeloproliferative Neoplasms & Bone Marrow Failure Clinical Team  Nurse Navigator: Wynona Meals  New Patient Intake Coordinator: Glenford Bayley and Samella Parr  Return Patient Scheduler: Laurance Flatten  ??  Nurse Practitioner: Synetta Fail, AGNP  Clinical Pharmacist: Audie Box, CPP  Medication Access Specialist: Yetta Barre    Labwork:  Appointment on 01/01/2020   Component Date Value Ref Range Status   ??? Sodium 01/01/2020 139  135 - 145 mmol/L Final   ??? Potassium 01/01/2020 4.2  3.4 - 4.5 mmol/L Final   ??? Chloride 01/01/2020 106  98 - 107 mmol/L Final   ??? Anion Gap 01/01/2020 5  5 - 14 mmol/L Final   ??? CO2 01/01/2020 28.0  20.0 - 31.0 mmol/L Final   ??? BUN 01/01/2020 15  9 - 23 mg/dL Final   ??? Creatinine 01/01/2020 0.63  0.60 - 0.80 mg/dL Final   ??? BUN/Creatinine Ratio 01/01/2020 24   Final   ??? EGFR CKD-EPI Non-African American,* 01/01/2020 >90  >=60 mL/min/1.66m2 Final   ??? EGFR CKD-EPI African American, Fem* 01/01/2020 >90  >=60 mL/min/1.77m2 Final   ??? Glucose 01/01/2020 92  70 - 179 mg/dL Final   ??? Calcium 96/29/5284 9.6  8.7 - 10.4 mg/dL Final   ??? Albumin 13/24/4010 4.5  3.4 - 5.0 g/dL Final   ??? Total Protein 01/01/2020 7.4  5.7 - 8.2 g/dL Final   ??? Total Bilirubin 01/01/2020 1.1  0.3 - 1.2 mg/dL Final   ??? AST 27/25/3664 42* <=34 U/L Final   ??? ALT 01/01/2020 36  10 - 49 U/L Final   ??? Alkaline Phosphatase 01/01/2020 52  46 - 116 U/L Final   ??? Triglycerides 01/01/2020 78  0 - 150 mg/dL Final   ??? Cholesterol 01/01/2020 105  <=200 mg/dL Final   ??? HDL 40/34/7425 59  40 - 60 mg/dL Final   ??? LDL Calculated 01/01/2020 30* 40 - 99 mg/dL Final    NHLBI Recommended Ranges, LDL Cholesterol, for Adults (20+yrs) (ATPIII), mg/dL  Optimal              <956  Near Optimal        100-129  Borderline High     130-159  High                160-189  Very High            >=  190  NHLBI Recommended Ranges, LDL Cholesterol, for Children (2-19 yrs), mg/dL  Desirable            <161  Borderline High     110-129  High                 >=130     ??? VLDL Cholesterol Cal 01/01/2020 15.6  9 - 37 mg/dL Final   ??? Chol/HDL Ratio 01/01/2020 1.8  1.0 - 4.5 Final   ??? Non-HDL Cholesterol 01/01/2020 46* 70 - 130 mg/dL Final    Non-HDL Cholesterol Recommended Ranges (mg/dL)  Optimal       <096  Near Optimal 130 - 159  Borderline High 160 - 189  High             190 - 219  Very High       >220     ??? FASTING 01/01/2020 Unknown   Final   ??? WBC 01/01/2020 17.4* 4.5 - 11.0 10*9/L Final   ??? RBC 01/01/2020 4.27  4.00 - 5.20 10*12/L Final   ??? HGB 01/01/2020 11.1* 12.0 - 16.0 g/dL Final   ??? HCT 04/54/0981 36.5  36.0 - 46.0 % Final   ??? MCV 01/01/2020 85.4  80.0 - 100.0 fL Final   ??? MCH 01/01/2020 26.1  26.0 - 34.0 pg Final   ??? MCHC 01/01/2020 30.5* 31.0 - 37.0 g/dL Final   ??? RDW 19/14/7829 20.1* 12.0 - 15.0 % Final   ??? MPV 01/01/2020 9.7  7.0 - 10.0 fL Final   ??? Platelet 01/01/2020 271  150 - 440 10*9/L Final   ??? Variable HGB Concentration 01/01/2020 Slight* Not Present Final   ??? Neutrophils % 01/01/2020 84.5  % Final   ??? Lymphocytes % 01/01/2020 7.4  % Final   ??? Monocytes % 01/01/2020 3.5  % Final   ??? Eosinophils % 01/01/2020 1.1  % Final   ??? Basophils % 01/01/2020 1.6  % Final   ??? Absolute Neutrophils 01/01/2020 14.7* 2.0 - 7.5 10*9/L Final   ??? Absolute Lymphocytes 01/01/2020 1.3* 1.5 - 5.0 10*9/L Final   ??? Absolute Monocytes 01/01/2020 0.6  0.2 - 0.8 10*9/L Final   ??? Absolute Eosinophils 01/01/2020 0.2  0.0 - 0.4 10*9/L Final   ??? Absolute Basophils 01/01/2020 0.3* 0.0 - 0.1 10*9/L Final   ??? Large Unstained Cells 01/01/2020 2  0 - 4 % Final   ??? Microcytosis 01/01/2020 Slight* Not Present Final   ??? Anisocytosis 01/01/2020 Moderate* Not Present Final   ??? Hypochromasia 01/01/2020 Marked* Not Present Final

## 2020-01-15 DIAGNOSIS — D471 Chronic myeloproliferative disease: Principal | ICD-10-CM

## 2020-01-17 NOTE — Unmapped (Signed)
[facility] Refill Coordination Note    Specialty Medication(s) to be Shipped:   Hematology/Oncology: Mary Navarro    Other medication(s) to be shipped: No additional medications requested for fill at this time     Mary Navarro, DOB: Jun 10, 1970  Phone: [phone] (home)       All above HIPAA information was verified with patient.     Was a Nurse, learning disability used for this call? No    Completed refill call assessment today to schedule patient's medication shipment from the [facility] [phone]).       Specialty medication(s) and dose(s) confirmed: Regimen is correct and unchanged.   Changes to medications: Mary Navarro reports no changes at this time.  Changes to insurance: No  Questions for the pharmacist: No    Confirmed patient received Welcome Packet with first shipment. The patient will receive a drug information handout for each medication shipped and additional FDA Medication Guides as required.       DISEASE/MEDICATION-SPECIFIC INFORMATION        N/A    SPECIALTY MEDICATION ADHERENCE     Medication Adherence    Patient reported X missed doses in the last month: 0  Specialty Medication: Jakafi 10mg   Patient is on additional specialty medications: No  Informant: patient                Jakafi 10 mg: 12 days of medicine on hand         SHIPPING     Shipping address confirmed in Epic.     Delivery Scheduled: Yes, Expected medication delivery date: 01/24/20.     Medication will be delivered via UPS to the prescription address in Epic [place].    [name]   [facility] Specialty Technician

## 2020-01-23 MED FILL — JAKAFI 10 MG TABLET: ORAL | 30 days supply | Qty: 60 | Fill #2

## 2020-01-26 DIAGNOSIS — D471 Chronic myeloproliferative disease: Principal | ICD-10-CM

## 2020-01-26 MED ORDER — RUXOLITINIB 5 MG TABLET
ORAL_TABLET | Freq: Every day | ORAL | 2 refills | 30.00000 days | Status: CP
Start: 2020-01-26 — End: 2020-04-01
  Filled 2020-02-01: qty 30, 30d supply, fill #0

## 2020-01-26 NOTE — Unmapped (Signed)
Clinical Pharmacist Practitioner: [facility]       Patient Information: Mary Navarro  is a 50 y.o. 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    Dose: 10mg  two times daily    A/P: (discussed with Dr Anise Salvo)   -adjust ruxolitinib dose to 10mg  am, 15mg  pm, once 5mg  tablets received from pharmacy      Counseled patient on:  -dose adjustment of ruxolitinib  -new tablet size 5mg     Prescription:  -ruxolitinib 5mg  #30, 2 refills electronically submitted to [facility] today    Mary Navarro verbalized understanding of the treatment plan provided and had no further questions.     F/u: 11/27/19    ____________________________________________________________________________________    Interim History:  Patient reported following:  -bone pain (pain when leaning elbow on table) still present,but not as severe or frequent, nausea and dizziness last week, resolved after sleeping, no more insomnia  -reducing caffeine intake (maybe contributing to strangling feeling)  -dizziness comes and goes  -Discomfort at spleen area    Adherence: Patient reported missing 0 doses from start of ruxolitinib     Side effects: bone pain, insomnia, nausea, vomiting    Ruxolitinib dose history:  20mg  bid 10/14-11/9/21  10mg  bid 11/9-present    Medication access issues:  Pharmacy: [facility]  Insurance: [employer]  Copay: $0 with copay card    Labs:   No visits with results within 1 Day(s) from this visit.   Latest known visit with results is:   Lab on 01/01/2020   Component Date Value Ref Range Status   ??? Sodium 01/01/2020 139  135 - 145 mmol/L Final   ??? Potassium 01/01/2020 4.2  3.4 - 4.5 mmol/L Final   ??? Chloride 01/01/2020 106  98 - 107 mmol/L Final   ??? Anion Gap 01/01/2020 5  5 - 14 mmol/L Final   ??? CO2 01/01/2020 28.0  20.0 - 31.0 mmol/L Final   ??? BUN 01/01/2020 15  9 - 23 mg/dL Final   ??? Creatinine 01/01/2020 0.63  0.60 - 0.80 mg/dL Final   ??? BUN/Creatinine Ratio 01/01/2020 24   Final   ??? EGFR CKD-EPI Non-African American,* 01/01/2020 >90  >=60 mL/min/1.60m2 Final   ??? EGFR CKD-EPI African American, Fem* 01/01/2020 >90  >=60 mL/min/1.49m2 Final   ??? Glucose 01/01/2020 92  70 - 179 mg/dL Final   ??? Calcium 16/10/9602 9.6  8.7 - 10.4 mg/dL Final   ??? Albumin 54/09/8117 4.5  3.4 - 5.0 g/dL Final   ??? Total Protein 01/01/2020 7.4  5.7 - 8.2 g/dL Final   ??? Total Bilirubin 01/01/2020 1.1  0.3 - 1.2 mg/dL Final   ??? AST 14/78/2956 42* <=34 U/L Final   ??? ALT 01/01/2020 36  10 - 49 U/L Final   ??? Alkaline Phosphatase 01/01/2020 52  46 - 116 U/L Final   ??? Triglycerides 01/01/2020 78  0 - 150 mg/dL Final   ??? Cholesterol 01/01/2020 105  <=200 mg/dL Final   ??? HDL 21/30/8657 59  40 - 60 mg/dL Final   ??? LDL Calculated 01/01/2020 30* 40 - 99 mg/dL Final    NHLBI Recommended Ranges, LDL Cholesterol, for Adults (20+yrs) (ATPIII), mg/dL  Optimal              <846  Near Optimal        100-129  Borderline High     130-159  High  160-189  Very High            >=190  NHLBI Recommended Ranges, LDL Cholesterol, for Children (2-19 yrs), mg/dL  Desirable            <962  Borderline High     110-129  High                 >=130     ??? VLDL Cholesterol Cal 01/01/2020 15.6  9 - 37 mg/dL Final   ??? Chol/HDL Ratio 01/01/2020 1.8  1.0 - 4.5 Final   ??? Non-HDL Cholesterol 01/01/2020 46* 70 - 130 mg/dL Final    Non-HDL Cholesterol Recommended Ranges (mg/dL)  Optimal       <952  Near Optimal 130 - 159  Borderline High 160 - 189  High             190 - 219  Very High       >220     ??? FASTING 01/01/2020 Unknown   Final   ??? WBC 01/01/2020 17.4* 4.5 - 11.0 10*9/L Final   ??? RBC 01/01/2020 4.27  4.00 - 5.20 10*12/L Final   ??? HGB 01/01/2020 11.1* 12.0 - 16.0 g/dL Final   ??? HCT 84/13/2440 36.5  36.0 - 46.0 % Final   ??? MCV 01/01/2020 85.4  80.0 - 100.0 fL Final   ??? MCH 01/01/2020 26.1  26.0 - 34.0 pg Final   ??? MCHC 01/01/2020 30.5* 31.0 - 37.0 g/dL Final   ??? RDW 11/08/2534 20.1* 12.0 - 15.0 % Final   ??? MPV 01/01/2020 9.7  7.0 - 10.0 fL Final   ??? Platelet 01/01/2020 271  150 - 440 10*9/L Final   ??? Variable HGB Concentration 01/01/2020 Slight* Not Present Final   ??? Neutrophils % 01/01/2020 84.5  % Final   ??? Lymphocytes % 01/01/2020 7.4  % Final   ??? Monocytes % 01/01/2020 3.5  % Final   ??? Eosinophils % 01/01/2020 1.1  % Final   ??? Basophils % 01/01/2020 1.6  % Final   ??? Absolute Neutrophils 01/01/2020 14.7* 2.0 - 7.5 10*9/L Final   ??? Absolute Lymphocytes 01/01/2020 1.3* 1.5 - 5.0 10*9/L Final   ??? Absolute Monocytes 01/01/2020 0.6  0.2 - 0.8 10*9/L Final   ??? Absolute Eosinophils 01/01/2020 0.2  0.0 - 0.4 10*9/L Final   ??? Absolute Basophils 01/01/2020 0.3* 0.0 - 0.1 10*9/L Final   ??? Large Unstained Cells 01/01/2020 2  0 - 4 % Final   ??? Microcytosis 01/01/2020 Slight* Not Present Final   ??? Anisocytosis 01/01/2020 Moderate* Not Present Final   ??? Hypochromasia 01/01/2020 Marked* Not Present Final   ??? Smear Review Comments 01/01/2020 See Comment* Undefined Final    Slide reviewed. Blasts present- rare. Promyelocytes present- rare. Myelocytes present.   ??? Polychromasia 01/01/2020 Slight* Not Present Final   ??? Ovalocytes 01/01/2020 Moderate* Not Present Final   ??? Tear Drop Cells 01/01/2020 Moderate* Not Present Final   ??? Poikilocytosis 01/01/2020 Moderate* Not Present Final        Approximate telephone time spent with patient: 7 minutes     Audie Box, PharmD, BCOP, CPP  Clinical Pharmacist Practitioner, Benign Hematology

## 2020-01-31 NOTE — Unmapped (Signed)
Clinical Assessment Needed For: Dose Change  Medication: Jakafi 5mg  tablet  Last Fill Date/Day Supply: 01/23/20 / 30 days  Copay $0  Was previous dose already scheduled to fill: No    Notes to Pharmacist: To be taken with Jakafi 10mg  tablets.

## 2020-01-31 NOTE — Unmapped (Signed)
[facility] Clinical Assessment & Refill Coordination Note    Mary Navarro Mary Navarro, DOB: [birth year]  Phone: [phone] (home)     All above HIPAA information was verified with patient.     Was a Nurse, learning disability used for this call? No    Specialty Medication(s):   Hematology/Oncology: [id number]     Current Outpatient Medications   Medication Sig Dispense Refill   ??? ASHWAGANDHA ROOT EXTRACT,BULK, MISC      ??? aspirin (ECOTRIN) 81 MG tablet 1 tablet     ??? BORIC ACID MISC Compounded Boric Acid 600mg  vaginal capsules     ??? cetirizine (ZYRTEC) 10 MG tablet Take 10 mg by mouth.     ??? cholecalciferol, vitamin D3-25 mcg, 1,000 unit,, (VITAMIN D3) 25 mcg (1,000 unit) capsule Take 25 mcg by mouth daily.     ??? cyclobenzaprine (FLEXERIL) 5 MG tablet      ??? ferrous sulfate 324 mg (65 mg iron) EC tablet      ??? fluorouraciL (EFUDEX) 5 % cream fluorouracil 5 % topical cream     ??? fluticasone propionate (FLONASE) 50 mcg/actuation nasal spray 1 spray into each nostril.     ??? multivit-min/ferrous fumarate (MULTI VITAMIN ORAL)      ??? olopatadine (PAZEO) 0.7 % ophthalmic solution Pazeo 0.7 % eye drops   INSTILL 1 DROP INTO BOTH EYES EVERY DAY AS NEEDED     ??? promethazine-dextromethorphan (PROMETHAZINE-DM) 6.25-15 mg/5 mL syrup      ??? ruxolitinib (JAKAFI) 10 mg tablet Take 1 tablet (10 mg total) by mouth Two (2) times a day. (Patient not taking: Reported on 11/27/2019) 60 tablet 2   ??? ruxolitinib (JAKAFI) 5 mg tablet Take 1 tablet (5 mg total) by mouth daily with evening meal. Take with 10mg  in the evening = total 15mg  30 tablet 2   ??? sertraline HCl (ZOLOFT ORAL)      ??? traMADoL (ULTRAM) 50 mg tablet Take 25-50 mg by mouth.     ??? zinc gluconate 50 mg (7 mg elemental zinc) tablet Take 50 mg by mouth daily.       No current facility-administered medications for this visit.        Changes to medications: Mary Navarro reports no changes at this time.    Allergies   Allergen Reactions   ??? Doxycycline Hyclate      Other reaction(s): stomach upset   ??? Hydrocodone-Homatropine      Other reaction(s): itching   ??? Paroxetine Hcl Other (See Comments)     Other reaction(s): Other (See Comments)  Made her dazed  Made her dazed    Other reaction(s): fatigue/not effective   ??? Doxycycline Nausea Only     Stomach cramps  Stomach cramps         Changes to allergies: No    SPECIALTY MEDICATION ADHERENCE     Jakafi 10 mg: 22 days of medicine on hand   Jakafi 5mg : 0 days of medicine on hand         Specialty medication(s) dose(s) confirmed: Patient reports changes to the regimen as follows: increase to 10mg  qam and 15mg  qpm     Are there any concerns with adherence? No    Adherence counseling provided? Not needed    CLINICAL MANAGEMENT AND INTERVENTION      Clinical Benefit Assessment:    Do you feel the medicine is effective or helping your condition? No    Clinical Benefit counseling provided? Progress note from 01/26/20 shows evidence  of clinical benefit    Adverse Effects Assessment:    Are you experiencing any side effects? Yes, patient reports experiencing bone pain and nausea. Side effect counseling provided: both are managable    Are you experiencing difficulty administering your medicine? No    Quality of Life Assessment:    How many days over the past month did your condition  keep you from your normal activities? For example, brushing your teeth or getting up in the morning. 0    Have you discussed this with your provider? Not needed    Therapy Appropriateness:    Is therapy appropriate? Yes, therapy is appropriate and should be continued    DISEASE/MEDICATION-SPECIFIC INFORMATION      N/A    PATIENT SPECIFIC NEEDS     - Does the patient have any physical, cognitive, or cultural barriers? No    - Is the patient high risk? Yes, patient is taking oral chemotherapy. Appropriateness of therapy as been assessed    - Does the patient require a Care Management Plan? No     - Does the patient require physician intervention or other additional services (i.e. nutrition, smoking cessation, social work)? No      SHIPPING     Specialty Medication(s) to be Shipped:   Hematology/Oncology: Jakafi 5mg  only    Other medication(s) to be shipped: No additional medications requested for fill at this time     Changes to insurance: No    Delivery Scheduled: Yes, Expected medication delivery date: 02/02/20.     Medication will be delivered via UPS to the confirmed prescription address in [facility]    The patient will receive a drug information handout for each medication shipped and additional FDA Medication Guides as required.  Verified that patient has previously received a [facility].    All of the patient's questions and concerns have been addressed.    Mary Navarro   [facility] Pharmacist

## 2020-02-05 ENCOUNTER — Encounter: Admit: 2020-02-05 | Discharge: 2020-02-06 | Payer: PRIVATE HEALTH INSURANCE

## 2020-02-05 ENCOUNTER — Encounter
Admit: 2020-02-05 | Discharge: 2020-02-06 | Payer: PRIVATE HEALTH INSURANCE | Attending: Internal Medicine | Primary: Internal Medicine

## 2020-02-05 DIAGNOSIS — K219 Gastro-esophageal reflux disease without esophagitis: Principal | ICD-10-CM

## 2020-02-05 DIAGNOSIS — D509 Iron deficiency anemia, unspecified: Principal | ICD-10-CM

## 2020-02-05 DIAGNOSIS — R0600 Dyspnea, unspecified: Principal | ICD-10-CM

## 2020-02-05 DIAGNOSIS — R162 Hepatomegaly with splenomegaly, not elsewhere classified: Principal | ICD-10-CM

## 2020-02-05 DIAGNOSIS — Z79899 Other long term (current) drug therapy: Principal | ICD-10-CM

## 2020-02-05 DIAGNOSIS — D471 Chronic myeloproliferative disease: Principal | ICD-10-CM

## 2020-02-05 DIAGNOSIS — Z7982 Long term (current) use of aspirin: Principal | ICD-10-CM

## 2020-02-05 LAB — CBC W/ AUTO DIFF
BASOPHILS ABSOLUTE COUNT: 0.2 10*9/L — ABNORMAL HIGH (ref 0.0–0.1)
BASOPHILS RELATIVE PERCENT: 1.1 %
EOSINOPHILS ABSOLUTE COUNT: 0.2 10*9/L (ref 0.0–0.4)
EOSINOPHILS RELATIVE PERCENT: 1.2 %
HEMATOCRIT: 30.5 % — ABNORMAL LOW (ref 36.0–46.0)
HEMOGLOBIN: 9.3 g/dL — ABNORMAL LOW (ref 12.0–16.0)
LARGE UNSTAINED CELLS: 2 % (ref 0–4)
LYMPHOCYTES ABSOLUTE COUNT: 1.1 10*9/L — ABNORMAL LOW (ref 1.5–5.0)
LYMPHOCYTES RELATIVE PERCENT: 8.4 %
MEAN CORPUSCULAR HEMOGLOBIN CONC: 30.6 g/dL — ABNORMAL LOW (ref 31.0–37.0)
MEAN CORPUSCULAR HEMOGLOBIN: 26.1 pg (ref 26.0–34.0)
MEAN CORPUSCULAR VOLUME: 85.4 fL (ref 80.0–100.0)
MEAN PLATELET VOLUME: 11.5 fL — ABNORMAL HIGH (ref 7.0–10.0)
MONOCYTES ABSOLUTE COUNT: 0.5 10*9/L (ref 0.2–0.8)
MONOCYTES RELATIVE PERCENT: 3.9 %
NEUTROPHILS ABSOLUTE COUNT: 11.2 10*9/L — ABNORMAL HIGH (ref 2.0–7.5)
NEUTROPHILS RELATIVE PERCENT: 83.3 %
PLATELET COUNT: 260 10*9/L (ref 150–440)
RED BLOOD CELL COUNT: 3.57 10*12/L — ABNORMAL LOW (ref 4.00–5.20)
RED CELL DISTRIBUTION WIDTH: 18.8 % — ABNORMAL HIGH (ref 12.0–15.0)
WBC ADJUSTED: 13.4 10*9/L — ABNORMAL HIGH (ref 4.5–11.0)

## 2020-02-05 LAB — SLIDE REVIEW

## 2020-02-05 LAB — LACTATE DEHYDROGENASE: LACTATE DEHYDROGENASE: 919 U/L — ABNORMAL HIGH (ref 120–246)

## 2020-02-05 LAB — COMPREHENSIVE METABOLIC PANEL
ALBUMIN: 4.2 g/dL (ref 3.4–5.0)
ALKALINE PHOSPHATASE: 50 U/L (ref 46–116)
ALT (SGPT): 18 U/L (ref 10–49)
ANION GAP: 6 mmol/L (ref 5–14)
AST (SGOT): 35 U/L — ABNORMAL HIGH (ref <=34)
BILIRUBIN TOTAL: 1 mg/dL (ref 0.3–1.2)
BLOOD UREA NITROGEN: 20 mg/dL (ref 9–23)
BUN / CREAT RATIO: 28
CALCIUM: 9 mg/dL (ref 8.7–10.4)
CHLORIDE: 103 mmol/L (ref 98–107)
CO2: 26 mmol/L (ref 20.0–31.0)
CREATININE: 0.72 mg/dL
EGFR CKD-EPI AA FEMALE: >90 mL/min/{1.73_m2} (ref >=60)
EGFR CKD-EPI NON-AA FEMALE: >90 mL/min/{1.73_m2} (ref >=60)
GLUCOSE RANDOM: 90 mg/dL (ref 70–179)
POTASSIUM: 4.4 mmol/L (ref 3.4–4.5)
PROTEIN TOTAL: 6.8 g/dL (ref 5.7–8.2)
SODIUM: 135 mmol/L (ref 135–145)

## 2020-02-05 LAB — MAGNESIUM: MAGNESIUM: 2.1 mg/dL (ref 1.6–2.6)

## 2020-02-05 NOTE — Unmapped (Unsigned)
Labs drawn peripherally by Enid Baas.

## 2020-02-05 NOTE — Unmapped (Signed)
Myeloproliferative Neoplasms & Bone Marrow Failure Clinic Follow Up Visit    Patient: Mary Navarro  MRN: [id number]  DOB: [birth year]  Date of Visit: 02/05/2020    Referred By: Adrian Saran, PAC  [address]  [place],  [place] [address]    Reason for Visit   Mary Navarro is a 50 y.o. seen in follow up of post-essential thrombocythemia myelofibrosis.     Assessment   #1 Post-ET myelofibrosis  #2 Iron deficiency - improved, normalized iron stores as of 12/11/19 labs  #3 Anemia related to #1 and #2  #4 Splenomegaly related to #1  #5 Rash - not present today  #6 Hepatomegaly   #7 GERD/Esophageal Spasms     Overall, stable, chronic-phase disease with reasonable tolerance of ruxolitinib 10mg /15mg .  Spleen is somewhat smaller than prior to starting ruxolitinib, but have been unable to push the dose of ruxolitinib due to side effects.  Will hopefully get a bit more spleen reduction with the recent increase of 5mg  po daily.  Recall that there is evidence that spleen response is equated with overall survival, hence the recommendation to push the dose as tolerated.  Next steps if ruxolitinib is not well tolerated would be consideration for fedratinib.  I do not favor interferon here, as it is highly likely the patient will be heading to allogeneic stem cell transplantation in the next several years (and IFN increases the risk of GVHD).     Note the hemoglobin of 9.3 g/dL, which is most likely secondary to ruxolitinib and should be at or near her nadir hemoglobin.  It is anticiapted that over the next weeks, the hemoglobin rebounds to ~10g/dL.  I would not adjust the ruxolitinib dose downward due to anemia at this time.     Regarding allogeneic SCT, still no strong indication, however, if patient does not have response to [facility], would be a nidus to re-consider given lack of other treatment options.     Plan   1. Continue Ruxolitinib 10mg  po qAM, 15mg  po qPM  2. If worsening side effects will consider further dose reduction of Rux as above  3. Continue ASA 81mg  po daily    4. May discontinue omeprazole, but if neck squeezing symptom returns (?esophageal spasm), would restart  5. TTE non-urgently to evaluate chronic DOE, question pulmonary hypertension  6. Follow-up in 3 months, sooner as-needed    [name], M.D.  Assistant Professor  Division of Hematology    Co-PI, [facility] MDS [facility]  Member, [employer]  Member, [facility]      [address]  [place] [address]  [address]  [place] [address]    Questions and appointments M-F 8am - 5pm: [phone] or [phone]    Myeloproliferative Neoplasms & Bone Marrow Failure Clinical Team  Nurse Navigator: Wynona Meals  New Patient Intake Coordinator: Glenford Bayley and Samella Parr  Return Patient Scheduler: Laurance Flatten    Nurse Practitioner: Synetta Fail, AGNP  Clinical Pharmacist: Audie Box, CPP  Medication Access Specialist: Yetta Barre            Interval History    Since last here on 01/01/2020, Mary Navarro has been doing reasonably well.  She has had continued intermittent sensation of feeling strangled (but without dyspnea, stridor, difficulty swallowing) lasting up to an hour at a time and without apparent provoking events. She was thinking this was related to caffeine and has  cut back, but isn't sure.  She did not have any episodes last week.      She denies night sweats, unintentional weight loss.  No LUQ pain, no early satiety.      Is having mild episodes of dizziness with exertion, but notes that she was able to clean her son's house (deep cleaning, including steaming the bathtub) with no dizziness.      Continues with stable dyspnea on exertion and fatigue, not generally limiting daily activities but does prevent her from doing things like hiking.  No chest pain, no lower extremity edema.     She increased Jakafi from 10mg  BID to 10mg  qAM and 15mg  qPM on 02/02/20.  So far, no increase in side effects.      History of Present Illness   Dx = Post-ET myelofibrosis, diagnosed 06/20/2019.    Patient was originally diagnosed with essential thrombocythemia in 78 at the age of 84 when routine check pre-Accutane.  Platelets were >1 million.   Had two children without pregnancy complications after the diagnosis.      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 Abdominal ultrasound from 11/22/2015 shows spleen 23 cm.   B) Bone pain  - shins are very tender all the time.   C) Iron deficiency   D) rash - urticaria perhaps heat-related; erythematous, blanchable rash on lower extremities      Prognostic Risk Classification:  DIPSS-Plus: Intermediate-1 (anemia, hgb <10)  ZOXWR60A5.4: Low risk (but incomplete data).  Corresponds to median overall survival of 16.4 years. Potential for intermediate risk disease if HMR category, median overall survival 7.7 years.    MYSEC-PM: Low risk    Allogenic Stem Cell Transplant Assessment:  MTSS Score for allogenic stem cell transplant (Blood (2019) 133 (20): 2233???2242.):  0 points (but incomplete data) = low-risk.  Very high benefit from allogeneic stem cell transplant, minimal risk.         Treatment History/Disease Course:  A. Aspirin alone - stopped ~1997  B. Observation alone.    CEarvin Hansen 20mg  PO BID -    - started 10/16/2019->dose reduced to 20mg  daily on 11/21/2019  Had been started initially on Ruxolitinib 20mg  BID with excellent splenic response, though required dose reduction secondary to intolerable side effects (bone pain, nausea, emesis).   - transitioned to 10mg  BID on 11/27/2019  Currently on 10mg  BID with improved side effect profile (ongoing nausea, mild dizziness, joint discomfort predominately in hands) and otherwise ongoing control of symptoms (mild increase in spleen size per patient but based on exam ~18-19cm, which is still a significant improvement from prior to start of therapy with spleen measuring 26.4x19.4x13.2cm then).    - 10mg  qAM, 15mg  qPM - 02/02/20  Tolerating well so far, without any increase in symptoms.      Review of Systems   10 systems reviewed and negative except as noted in the Interval History/HPI.    Medications     Current Outpatient Medications   Medication Sig Dispense Refill   ??? ASHWAGANDHA ROOT EXTRACT,BULK, MISC      ??? aspirin (ECOTRIN) 81 MG tablet 1 tablet     ??? BORIC ACID MISC Compounded Boric Acid 600mg  vaginal capsules     ??? cetirizine (ZYRTEC) 10 MG tablet Take 10 mg by mouth.     ??? cholecalciferol, vitamin D3-25  mcg, 1,000 unit,, (VITAMIN D3) 25 mcg (1,000 unit) capsule Take 25 mcg by mouth daily.     ??? cyclobenzaprine (FLEXERIL) 5 MG tablet      ??? ferrous sulfate 324 mg (65 mg iron) EC tablet      ??? fluorouraciL (EFUDEX) 5 % cream fluorouracil 5 % topical cream     ??? fluticasone propionate (FLONASE) 50 mcg/actuation nasal spray 1 spray into each nostril.     ??? multivit-min/ferrous fumarate (MULTI VITAMIN ORAL)      ??? olopatadine (PAZEO) 0.7 % ophthalmic solution Pazeo 0.7 % eye drops   INSTILL 1 DROP INTO BOTH EYES EVERY DAY AS NEEDED     ??? promethazine-dextromethorphan (PROMETHAZINE-DM) 6.25-15 mg/5 mL syrup      ??? ruxolitinib (JAKAFI) 10 mg tablet Take 1 tablet (10 mg total) by mouth Two (2) times a day. 60 tablet 2   ??? ruxolitinib (JAKAFI) 5 mg tablet Take 1 tablet (5 mg total) by mouth daily with evening meal. Take with 10mg  in the evening = total 15mg  30 tablet 2   ??? sertraline HCl (ZOLOFT ORAL)      ??? zinc gluconate 50 mg (7 mg elemental zinc) tablet Take 50 mg by mouth daily.     ??? traMADoL (ULTRAM) 50 mg tablet Take 25-50 mg by mouth. (Patient not taking: Reported on 02/05/2020)       No current facility-administered medications for this visit.       Allergies     Allergies   Allergen Reactions   ??? Doxycycline Hyclate      Other reaction(s): stomach upset   ??? Hydrocodone-Homatropine      Other reaction(s): itching   ??? Paroxetine Hcl Other (See Comments)     Other reaction(s): Other (See Comments)  Made her dazed  Made her dazed    Other reaction(s): fatigue/not effective   ??? Doxycycline Nausea Only     Stomach cramps  Stomach cramps         Past Medical and Surgical History     Past Medical History:   Diagnosis Date   ??? Myelofibrosis (CMS-HCC) 07/04/2019     No past surgical history on file.    Social History     Social History     Socioeconomic History   ??? Marital status: Married     Spouse name: Not on file   ??? Number of children: Not on file   ??? Years of education: Not on file   ??? Highest education level: Not on file   Occupational History   ??? Not on file   Tobacco Use   ??? Smoking status: Never Smoker   ??? Smokeless tobacco: Never Used   Substance and Sexual Activity   ??? Alcohol use: Yes     Alcohol/week: 4.0 standard drinks     Types: 4 Cans of beer per week   ??? Drug use: Not on file   ??? Sexual activity: Not on file   Other Topics Concern   ??? Not on file   Social History Narrative   ??? Not on file     Social Determinants of Health     Financial Resource Strain: Not on file   Food Insecurity: Not on file   Transportation Needs: Not on file   Physical Activity: Not on file   Stress: Not on file   Social Connections: Not on file   Formerly a Doctor, hospital.  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 Husband is physical therapist.     Family History   2 children - son (19yo) and daughter (17yo)  Twin sister and older sister - twin sister with significant OA and has had a hip and knee replacement.  Melissa older sister in good health.      Mom - breast cancer at 46yo - negative BRCA testing  Maternal grandmother - breast cancer at 70yo    Physical Examination     Vitals:    02/05/20 1422   BP: 127/88   Pulse: 85   Resp: 16   Temp: 36.6 ??C (97.8 ??F)   TempSrc: Tympanic   Weight: 73 kg (161 lb)   Height: 166.4 cm (5' 5.5)     GENERAL:  In NAD, well appearing, very pleasant.  Accompanied by her daughter, a high school senior.  Wearing surgical masks per pandemic guidelines.   HEENT: No scleral icterus or injection. EOMI. No oropharyngeal ulceration.   LYMPH:  No cervical, supraclavicular, axillary, epitrochlear adenopathy.   LUNGS: Speaking comfortably in full sentences, no increased work of breathing.  No adventitious sounds.   GI:  Soft, non-tender, non-distended. Appreciable splenomegaly 8 finger breadths below the LCM.    EXTREMITIES:  No lower extremity edema. No clubbing nor cyanosis.   NEURO:  AO X 4. No focal deficits.   SKIN: No petechiae, purpura, or rashes.   PSYCH:  Mood is good, congruent affect.     Laboratory Testing and Imaging     Results for orders placed or performed in visit on 02/05/20   Comprehensive Metabolic Panel   Result Value Ref Range    Sodium 135 135 - 145 mmol/L    Potassium 4.4 3.4 - 4.5 mmol/L    Chloride 103 98 - 107 mmol/L    Anion Gap 6 5 - 14 mmol/L    CO2 26.0 20.0 - 31.0 mmol/L    BUN 20 9 - 23 mg/dL    Creatinine 1.61 0.96 - 0.80 mg/dL    BUN/Creatinine Ratio 28     EGFR CKD-EPI Non-African American, Female >90 >=60 mL/min/1.20m2    EGFR CKD-EPI African American, Female >90 >=60 mL/min/1.6m2    Glucose 90 70 - 179 mg/dL    Calcium 9.0 8.7 - 04.5 mg/dL    Albumin 4.2 3.4 - 5.0 g/dL    Total Protein 6.8 5.7 - 8.2 g/dL    Total Bilirubin 1.0 0.3 - 1.2 mg/dL    AST 35 (H) <=40 U/L    ALT 18 10 - 49 U/L    Alkaline Phosphatase 50 46 - 116 U/L   CBC w/ Differential   Result Value Ref Range    RBC 3.57 (L) 4.00 - 5.20 10*12/L    HGB 9.3 (L) 12.0 - 16.0 g/dL    HCT 98.1 (L) 19.1 - 46.0 %    MCV 85.4 80.0 - 100.0 fL    MCH 26.1 26.0 - 34.0 pg    MCHC 30.6 (L) 31.0 - 37.0 g/dL    RDW 47.8 (H) 29.5 - 15.0 %    MPV 11.5 (H) 7.0 - 10.0 fL    Platelet 260 150 - 440 10*9/L    Variable HGB Concentration Moderate (A) Not Present    Microcytosis Slight (A) Not Present    Anisocytosis Moderate (A) Not Present    Hypochromasia Marked (A) Not Present

## 2020-02-05 NOTE — Unmapped (Addendum)
It was a pleasure seeing you today!    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 Continue your Jakafi at the same dose, 10mg  in the morning and 15 mg in the evening.   2. Continue your aspirin, 81mg  daily   3. Continue your antacid - omeprazole   4. We will see you back in 3 months, sooner as-needed.     [name], M.D.  Assistant Professor  Division of Hematology  ??  Co-PI, [employer] MDS [facility]  Member, [facility]  Member, [facility]    ??  [address]  [place] [address]  [address]  [place] [address]  ??  Questions and appointments M-F 8am - 5pm: [id number] or [phone]  ??  Myeloproliferative Neoplasms & Bone Marrow Failure Clinical Team  Nurse Navigator: Wynona Meals  New Patient Intake Coordinator: Glenford Bayley and Samella Parr  Return Patient Scheduler: Laurance Flatten  ??  Nurse Practitioner: Synetta Fail, AGNP  Clinical Pharmacist: Audie Box, CPP  Medication Access Specialist: Yetta Barre    Labwork:  Lab on 02/05/2020   Component Date Value Ref Range Status   ??? Sodium 02/05/2020 135  135 - 145 mmol/L Final   ??? Potassium 02/05/2020 4.4  3.4 - 4.5 mmol/L Final   ??? Chloride 02/05/2020 103  98 - 107 mmol/L Final   ??? Anion Gap 02/05/2020 6  5 - 14 mmol/L Final   ??? CO2 02/05/2020 26.0  20.0 - 31.0 mmol/L Final   ??? BUN 02/05/2020 20  9 - 23 mg/dL Final   ??? Creatinine 02/05/2020 0.72  0.60 - 0.80 mg/dL Final   ??? BUN/Creatinine Ratio 02/05/2020 28   Final   ??? EGFR CKD-EPI Non-African American,* 02/05/2020 >90  >=60 mL/min/1.50m2 Final   ??? EGFR CKD-EPI African American, Fem* 02/05/2020 >90  >=60 mL/min/1.69m2 Final   ??? Glucose 02/05/2020 90  70 - 179 mg/dL Final   ??? Calcium 91/47/8295 9.0  8.7 - 10.4 mg/dL Final   ??? Albumin 62/13/0865 4.2  3.4 - 5.0 g/dL Final   ??? Total Protein 02/05/2020 6.8  5.7 - 8.2 g/dL Final   ??? Total Bilirubin 02/05/2020 1.0  0.3 - 1.2 mg/dL Final   ??? AST 78/46/9629 35* <=34 U/L Final ??? ALT 02/05/2020 18  10 - 49 U/L Final   ??? Alkaline Phosphatase 02/05/2020 50  46 - 116 U/L Final   ??? LDH 02/05/2020 919* 120 - 246 U/L Final   ??? RBC 02/05/2020 3.57* 4.00 - 5.20 10*12/L Preliminary   ??? HGB 02/05/2020 9.3* 12.0 - 16.0 g/dL Preliminary   ??? HCT 02/05/2020 30.5* 36.0 - 46.0 % Preliminary   ??? MCV 02/05/2020 85.4  80.0 - 100.0 fL Preliminary   ??? MCH 02/05/2020 26.1  26.0 - 34.0 pg Preliminary   ??? MCHC 02/05/2020 30.6* 31.0 - 37.0 g/dL Preliminary   ??? RDW 02/05/2020 18.8* 12.0 - 15.0 % Preliminary   ??? MPV 02/05/2020 11.5* 7.0 - 10.0 fL Preliminary   ??? Platelet 02/05/2020 260  150 - 440 10*9/L Preliminary   ??? Variable HGB Concentration 02/05/2020 Moderate* Not Present Preliminary   ??? Microcytosis 02/05/2020 Slight* Not Present Preliminary   ??? Anisocytosis 02/05/2020 Moderate* Not Present Preliminary   ??? Hypochromasia 02/05/2020 Marked* Not Present Preliminary

## 2020-02-06 DIAGNOSIS — D471 Chronic myeloproliferative disease: Principal | ICD-10-CM

## 2020-02-06 MED ORDER — RUXOLITINIB 10 MG TABLET
ORAL_TABLET | Freq: Two times a day (BID) | ORAL | 2 refills | 30.00000 days | Status: CP
Start: 2020-02-06 — End: 2020-03-07
  Filled 2020-02-22: qty 60, 30d supply, fill #0

## 2020-02-12 DIAGNOSIS — D471 Chronic myeloproliferative disease: Principal | ICD-10-CM

## 2020-02-17 NOTE — Unmapped (Signed)
[facility] Refill Coordination Note    Specialty Medication(s) to be Shipped:   Hematology/Oncology: Jakafi 5 and 10mg     Other medication(s) to be shipped: No additional medications requested for fill at this time     Mary Navarro, DOB: 08-Dec-1970  Phone: [phone] (home)       All above HIPAA information was verified with patient.     Was a Nurse, learning disability used for this call? No    Completed refill call assessment today to schedule patient's medication shipment from the [facility] [phone]).       Specialty medication(s) and dose(s) confirmed: Regimen is correct and unchanged.   Changes to medications: Mary Navarro reports no changes at this time.  Changes to insurance: No  Questions for the pharmacist: No    Confirmed patient received Welcome Packet with first shipment. The patient will receive a drug information handout for each medication shipped and additional FDA Medication Guides as required.       DISEASE/MEDICATION-SPECIFIC INFORMATION        N/A    SPECIALTY MEDICATION ADHERENCE     Medication Adherence    Patient reported X missed doses in the last month: 0  Specialty Medication: Jakafi 10mg   Patient is on additional specialty medications: Yes  Additional Specialty Medications: Jakafi 5mg   Patient Reported Additional Medication X Missed Doses in the Last Month: 0  Patient is on more than two specialty medications: No  Informant: patient                Jakafi 10 mg: 10 days of medicine on hand   Jakafi 5 mg: 14 days of medicine on hand         SHIPPING     Shipping address confirmed in Epic.     Delivery Scheduled: Yes, Expected medication delivery date: 02/23/20.     Medication will be delivered via UPS to the prescription address in Epic [place].    Mary Navarro   [facility] Specialty Technician

## 2020-02-22 NOTE — Unmapped (Signed)
Rudene Christians Whidden 's Earvin Hansen 5mg  tablets shipment will be delayed as a result of the medication is too soon to refill until 02/24/20.     I have reached out to the patient and left a voicemail message.  We will wait for a call back from the patient to reschedule the delivery.  We have not confirmed the new delivery date.

## 2020-02-26 MED FILL — JAKAFI 5 MG TABLET: ORAL | 30 days supply | Qty: 30 | Fill #1

## 2020-02-26 NOTE — Unmapped (Signed)
Rudene Christians Lasser 's Jakafi shipment will be sent out  as a result of the medication is no longer refill too soon.      I have reached out to the patient and communicated the delivery change. We will reschedule the medication for the delivery date that the patient agreed upon.  We have confirmed the delivery date as 02/27/20, via ups.

## 2020-03-06 NOTE — Unmapped (Signed)
NN called patient with increase in her baseline dyspnea on exertion. Patient reports that normal activity like going in the yard and picking up stuff, along with walking up her driveway just wears her out more in the last 2 weeks. She feels like glutes and thighs are getting more sore from her normal level of activity. She also just barely restarted physical therapy, but the soreness was before that. She's planning to go to [employer] (if diarrhea allows) for labs.     Diarrhea since Sunday Feb 20. She had a regular BM this AM, then 3 episodes of diarrhea since (by 3:15 when NN called her). Possible concern for diarrhea incontinence, because she will wipe and blot again and there will still be feces. She also had the urge to go yesterday multiple times in the evening, but it was just tiny leaks that she would wipe on the TP. She felt it leaking a bit before getting to the bathroom too, though not enough to mess her pants. NN recommended wet wipes to get a better clean and to reduce irritation. 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    Patient has no other signs/symptoms of illness. Had a small bout of sickness Feb 9th, diarrhea 3x times and then resolved.    NN has reached out to the team for further advice to give patient.

## 2020-03-08 LAB — CBC W/ DIFFERENTIAL
BASOPHILS ABSOLUTE COUNT: 0.3 10*3/uL — ABNORMAL HIGH (ref 0.0–0.2)
BASOPHILS RELATIVE PERCENT: 2 %
EOSINOPHILS ABSOLUTE COUNT: 0.2 10*3/uL (ref 0.0–0.4)
EOSINOPHILS RELATIVE PERCENT: 1 %
HEMATOCRIT: 34.2 % (ref 34.0–46.6)
HEMOGLOBIN: 10.5 g/dL — ABNORMAL LOW (ref 11.1–15.9)
LYMPHOCYTES ABSOLUTE COUNT: 1.5 10*3/uL (ref 0.7–3.1)
LYMPHOCYTES RELATIVE PERCENT: 10 %
MEAN CORPUSCULAR HEMOGLOBIN CONC: 30.7 g/dL — ABNORMAL LOW (ref 31.5–35.7)
MEAN CORPUSCULAR HEMOGLOBIN: 25.4 pg — ABNORMAL LOW (ref 26.6–33.0)
MEAN CORPUSCULAR VOLUME: 83 fL (ref 79–97)
MONOCYTES ABSOLUTE COUNT: 0.9 10*3/uL (ref 0.1–0.9)
MONOCYTES RELATIVE PERCENT: 6 %
NEUTROPHILS ABSOLUTE COUNT: 9.7 10*3/uL — ABNORMAL HIGH (ref 1.4–7.0)
NEUTROPHILS RELATIVE PERCENT: 61 %
NUCLEATED RED BLOOD CELLS: 1 % — ABNORMAL HIGH (ref 0–0)
PLATELET COUNT: 330 10*3/uL (ref 150–450)
RED BLOOD CELL COUNT: 4.14 x10E6/uL (ref 3.77–5.28)
RED CELL DISTRIBUTION WIDTH: 17.6 % — ABNORMAL HIGH (ref 11.7–15.4)
WHITE BLOOD CELL COUNT: 15.1 10*3/uL — ABNORMAL HIGH (ref 3.4–10.8)

## 2020-03-08 LAB — IMMATURE CELLS
BANDS: 3 %
BLASTS/BLAST LIKE CELLS: 4 % — ABNORMAL HIGH (ref 0–0)
METAMYLOCYTES-LABCORP: 12 % — ABNORMAL HIGH (ref 0–0)
MYELOCYTES: 1 % — ABNORMAL HIGH (ref 0–0)

## 2020-03-08 LAB — COMPREHENSIVE METABOLIC PANEL
A/G RATIO: 2.5 — ABNORMAL HIGH (ref 1.2–2.2)
ALBUMIN: 5.2 g/dL — ABNORMAL HIGH (ref 3.8–4.8)
ALKALINE PHOSPHATASE: 49 IU/L (ref 44–121)
ALT (SGPT): 25 IU/L (ref 0–32)
AST (SGOT): 38 IU/L (ref 0–40)
BILIRUBIN TOTAL: 0.9 mg/dL (ref 0.0–1.2)
BLOOD UREA NITROGEN: 23 mg/dL (ref 6–24)
BUN / CREAT RATIO: 28 — ABNORMAL HIGH (ref 9–23)
CALCIUM: 9.3 mg/dL (ref 8.7–10.2)
CHLORIDE: 99 mmol/L (ref 96–106)
CO2: 21 mmol/L (ref 20–29)
CREATININE: 0.82 mg/dL (ref 0.57–1.00)
GFR MDRD AF AMER: 99 mL/min/{1.73_m2}
GFR MDRD NON AF AMER: 85 mL/min/{1.73_m2}
GLOBULIN, TOTAL: 2.1 g/dL (ref 1.5–4.5)
GLUCOSE: 94 mg/dL (ref 65–99)
POTASSIUM: 4.6 mmol/L (ref 3.5–5.2)
SODIUM: 137 mmol/L (ref 134–144)
TOTAL PROTEIN: 7.3 g/dL (ref 6.0–8.5)

## 2020-03-08 NOTE — Unmapped (Signed)
[facility] Triage Note     Patient: Mary Navarro     Reason for call:  returned call    Time call returned: 1255     Phone Assessment: Spoke with Nicholaus Bloom, she would like Dr. Anise Salvo to weigh in on her most recent lab results.      Triage Recommendations: Will reach out to Dr. Anise Salvo to My Chart message  Patient.     Patient Response: grateful     Outstanding tasks: My Chart message result interpretation     Patient Pharmacy has been verified and primary pharmacy has been marked as preferred

## 2020-03-08 NOTE — Unmapped (Signed)
Mary Navarro contacted the [employer] requesting results of the following:     Procedure: Labs  Completed On: 03/07/20   Had done at [facility]    Please contact [name] at [phone] for proper follow up.    Check Indicates criteria has been reviewed and confirmed with the patient:    [x]  Preferred Name   [x]  DOB and/or MR#  [x]  Preferred Contact Method  [x]  Phone Number(s)   []  MyChart     Thank you,   [name]  [facility]   [phone]

## 2020-03-11 DIAGNOSIS — D471 Chronic myeloproliferative disease: Principal | ICD-10-CM

## 2020-03-13 DIAGNOSIS — R197 Diarrhea, unspecified: Principal | ICD-10-CM

## 2020-03-13 NOTE — Unmapped (Signed)
Called patient regarding mychart message and Dr. Anise Salvo concern for C.diff. Patient said that while she had three bowel movements yesterday, only one of them was loose. Denied watery stool. Said she was most concerned about the low blood pressure, but yesterday and today have been much better compared to last week. She has been able to get out and walk and feels good.     NN told patient that we would send C.diff order to [facility] for future if she needed it. Patient said she may go pick up the sample cup this afternoon if diarrhea continues. NN recommended loperamide, but patient was hesitant to go the other direction and cause constipation when she's not having all loose stools right now.     Patient was appreciative of call and will keep [place] updated through mychart.

## 2020-03-15 NOTE — Unmapped (Signed)
[facility] Refill Coordination Note    Specialty Medication(s) to be Shipped:   Hematology/Oncology: Jakafi 5mg  and 10mg     Other medication(s) to be shipped: No additional medications requested for fill at this time     Mary Navarro, DOB: 30-May-1970  Phone: [phone] (home)       All above HIPAA information was verified with patient.     Was a Nurse, learning disability used for this call? No    Completed refill call assessment today to schedule patient's medication shipment from the [facility] [phone]).       Specialty medication(s) and dose(s) confirmed: Regimen is correct and unchanged.   Changes to medications: Mary Navarro reports no changes at this time.  Changes to insurance: No  Questions for the pharmacist: No    Confirmed patient received Welcome Packet with first shipment. The patient will receive a drug information handout for each medication shipped and additional FDA Medication Guides as required.       DISEASE/MEDICATION-SPECIFIC INFORMATION        N/A    SPECIALTY MEDICATION ADHERENCE     Medication Adherence    Patient reported X missed doses in the last month: 0  Specialty Medication: Jakafi 5mg   Patient is on additional specialty medications: Yes  Additional Specialty Medications: Jakafi 10mg   Patient Reported Additional Medication X Missed Doses in the Last Month: 0  Patient is on more than two specialty medications: No  Informant: patient                Jakafi 5 mg: 18 days of medicine on hand   Jakafi 10 mg: 12 days of medicine on hand         SHIPPING     Shipping address confirmed in Epic.     Delivery Scheduled: Yes, Expected medication delivery date: 03/26/20.     Medication will be delivered via UPS to the prescription address in Epic [place].    Mary Navarro   [facility] Specialty Technician

## 2020-03-22 LAB — CBC W/ DIFFERENTIAL
BASOPHILS ABSOLUTE COUNT: 0.6 10*3/uL — ABNORMAL HIGH (ref 0.0–0.2)
BASOPHILS RELATIVE PERCENT: 3 %
EOSINOPHILS ABSOLUTE COUNT: 0.2 10*3/uL (ref 0.0–0.4)
EOSINOPHILS RELATIVE PERCENT: 1 %
HEMATOCRIT: 34.1 % (ref 34.0–46.6)
HEMOGLOBIN: 10.1 g/dL — ABNORMAL LOW (ref 11.1–15.9)
LYMPHOCYTES ABSOLUTE COUNT: 1.9 10*3/uL (ref 0.7–3.1)
LYMPHOCYTES RELATIVE PERCENT: 10 %
MEAN CORPUSCULAR HEMOGLOBIN CONC: 29.6 g/dL — ABNORMAL LOW (ref 31.5–35.7)
MEAN CORPUSCULAR HEMOGLOBIN: 25.3 pg — ABNORMAL LOW (ref 26.6–33.0)
MEAN CORPUSCULAR VOLUME: 85 fL (ref 79–97)
MONOCYTES ABSOLUTE COUNT: 0.8 10*3/uL (ref 0.1–0.9)
MONOCYTES RELATIVE PERCENT: 4 %
NEUTROPHILS ABSOLUTE COUNT: 11.9 10*3/uL — ABNORMAL HIGH (ref 1.4–7.0)
NEUTROPHILS RELATIVE PERCENT: 55 %
NUCLEATED RED BLOOD CELLS: 3 % — ABNORMAL HIGH (ref 0–0)
PLATELET COUNT: 295 10*3/uL (ref 150–450)
RED BLOOD CELL COUNT: 4 x10E6/uL (ref 3.77–5.28)
RED CELL DISTRIBUTION WIDTH: 17.9 % — ABNORMAL HIGH (ref 11.7–15.4)
WHITE BLOOD CELL COUNT: 19.2 10*3/uL — ABNORMAL HIGH (ref 3.4–10.8)

## 2020-03-22 LAB — COMPREHENSIVE METABOLIC PANEL
A/G RATIO: 2.8 — ABNORMAL HIGH (ref 1.2–2.2)
ALBUMIN: 5 g/dL — ABNORMAL HIGH (ref 3.8–4.8)
ALKALINE PHOSPHATASE: 42 IU/L — ABNORMAL LOW (ref 44–121)
ALT (SGPT): 27 IU/L (ref 0–32)
AST (SGOT): 35 IU/L (ref 0–40)
BILIRUBIN TOTAL: 0.7 mg/dL (ref 0.0–1.2)
BLOOD UREA NITROGEN: 23 mg/dL (ref 6–24)
BUN / CREAT RATIO: 28 — ABNORMAL HIGH (ref 9–23)
CALCIUM: 9.1 mg/dL (ref 8.7–10.2)
CHLORIDE: 103 mmol/L (ref 96–106)
CO2: 20 mmol/L (ref 20–29)
CREATININE: 0.83 mg/dL (ref 0.57–1.00)
EGFR: 87 mL/min/{1.73_m2}
GLOBULIN, TOTAL: 1.8 g/dL (ref 1.5–4.5)
GLUCOSE: 87 mg/dL (ref 65–99)
POTASSIUM: 4.8 mmol/L (ref 3.5–5.2)
SODIUM: 141 mmol/L (ref 134–144)
TOTAL PROTEIN: 6.8 g/dL (ref 6.0–8.5)

## 2020-03-22 LAB — IMMATURE CELLS
BANDS: 7 %
BLASTS/BLAST LIKE CELLS: 2 % — ABNORMAL HIGH (ref 0–0)
METAMYLOCYTES-LABCORP: 18 % — ABNORMAL HIGH (ref 0–0)

## 2020-03-25 MED FILL — JAKAFI 5 MG TABLET: ORAL | 30 days supply | Qty: 30 | Fill #2

## 2020-03-25 MED FILL — JAKAFI 10 MG TABLET: ORAL | 30 days supply | Qty: 60 | Fill #1

## 2020-03-29 ENCOUNTER — Encounter: Admit: 2020-03-29 | Discharge: 2020-03-29 | Payer: PRIVATE HEALTH INSURANCE

## 2020-03-29 MED ORDER — NORETHINDRONE ACETATE 5 MG TABLET
0 days
Start: 2020-03-29 — End: ?

## 2020-04-08 DIAGNOSIS — D471 Chronic myeloproliferative disease: Principal | ICD-10-CM

## 2020-04-10 DIAGNOSIS — D471 Chronic myeloproliferative disease: Principal | ICD-10-CM

## 2020-04-10 MED ORDER — RUXOLITINIB 5 MG TABLET
ORAL_TABLET | Freq: Every day | ORAL | 5 refills | 30 days | Status: CP
Start: 2020-04-10 — End: 2020-05-10

## 2020-04-10 NOTE — Unmapped (Signed)
[facility] Refill Coordination Note    Specialty Medication(s) to be Shipped:   Hematology/Oncology: Jakafi 5 and 10MG     Other medication(s) to be shipped: No additional medications requested for fill at this time     Mary Navarro, DOB: 12-21-1970  Phone: [phone] (home)       All above HIPAA information was verified with patient.     Was a Nurse, learning disability used for this call? No    Completed refill call assessment today to schedule patient's medication shipment from the [facility] [phone]).       Specialty medication(s) and dose(s) confirmed: Regimen is correct and unchanged.   Changes to medications: [name] reports no changes at this time.  Changes to insurance: Yes: Pt gave new card info  Questions for the pharmacist: No    Confirmed patient received a Conservation officer, historic buildings and a Surveyor, mining with first shipment. The patient will receive a drug information handout for each medication shipped and additional FDA Medication Guides as required.       DISEASE/MEDICATION-SPECIFIC INFORMATION        N/A    SPECIALTY MEDICATION ADHERENCE     Medication Adherence    Patient reported X missed doses in the last month: 0  Specialty Medication: Jakafi 10mg   Patient is on additional specialty medications: Yes  Additional Specialty Medications: Jakafi 5mg   Patient Reported Additional Medication X Missed Doses in the Last Month: 0  Patient is on more than two specialty medications: No  Informant: patient                Jakafi 5 mg: 16 days of medicine on hand   Jakafi 10 mg: 16 days of medicine on hand         SHIPPING     Shipping address confirmed in Epic.     Delivery Scheduled: Yes, Expected medication delivery date: 04/24/20.  However, Rx request for refills was sent to the provider as there are none remaining.     Medication will be delivered via UPS to the prescription address in Epic [place].    Mary Navarro   [facility] Specialty Technician

## 2020-04-12 DIAGNOSIS — D471 Chronic myeloproliferative disease: Principal | ICD-10-CM

## 2020-04-12 MED ORDER — RUXOLITINIB 10 MG TABLET
ORAL_TABLET | Freq: Two times a day (BID) | ORAL | 11 refills | 30 days | Status: CP
Start: 2020-04-12 — End: 2020-05-12

## 2020-04-12 MED ORDER — RUXOLITINIB 5 MG TABLET
ORAL_TABLET | Freq: Every day | ORAL | 11 refills | 30 days | Status: CP
Start: 2020-04-12 — End: 2020-05-12

## 2020-04-12 NOTE — Unmapped (Signed)
Mary Navarro 's Jakafi shipment will be canceled  as a result of no longer being eligible to fill at [facility] pharmacy.     I have reached out to the patient  at [phone] and communicated the delivery change. We will not reschedule the medication and have removed this/these medication(s) from the work request.  We have canceled this work request.

## 2020-04-12 NOTE — Unmapped (Signed)
This patient has been disenrolled from the [facility] specialty pharmacy services due to a pharmacy change resulting from insurance limitations. The insurance company requires the patient fill at [facility].    Mary Navarro  [facility] Specialty Pharmacist

## 2020-04-12 NOTE — Unmapped (Signed)
The [facility] has received the prescription(s) for Jakafi 10mg  and 5mg  tablets. The triage team has completed the benefits investigation and has determined that the patient is NOT able to fill this medication at the [facility] due to insurance plan limitations. Please see additional information below and re-route the prescription to the preferred pharmacy. Thank you.    PA Required: Yes    Specialty Pharmacy Required:  [facility] - Phone: [phone] and Fax: [phone]

## 2020-04-17 MED ORDER — TRETINOIN 0.025 % TOPICAL CREAM
0 days
Start: 2020-04-17 — End: ?

## 2020-04-18 DIAGNOSIS — D471 Chronic myeloproliferative disease: Principal | ICD-10-CM

## 2020-05-06 ENCOUNTER — Ambulatory Visit
Admit: 2020-05-06 | Discharge: 2020-05-07 | Payer: PRIVATE HEALTH INSURANCE | Attending: Adult Health | Primary: Adult Health

## 2020-05-06 ENCOUNTER — Other Ambulatory Visit: Admit: 2020-05-06 | Discharge: 2020-05-07 | Payer: PRIVATE HEALTH INSURANCE

## 2020-05-06 DIAGNOSIS — D471 Chronic myeloproliferative disease: Principal | ICD-10-CM

## 2020-05-06 DIAGNOSIS — I071 Rheumatic tricuspid insufficiency: Principal | ICD-10-CM

## 2020-05-06 DIAGNOSIS — D473 Essential (hemorrhagic) thrombocythemia: Secondary | ICD-10-CM | POA: Diagnosis not present

## 2020-05-06 DIAGNOSIS — Z6826 Body mass index (BMI) 26.0-26.9, adult: Secondary | ICD-10-CM | POA: Diagnosis not present

## 2020-05-06 DIAGNOSIS — D474 Osteomyelofibrosis: Secondary | ICD-10-CM | POA: Diagnosis not present

## 2020-05-06 MED ORDER — ONDANSETRON HCL 8 MG TABLET
ORAL_TABLET | Freq: Three times a day (TID) | ORAL | 2 refills | 10 days | Status: CP | PRN
Start: 2020-05-06 — End: 2021-05-06

## 2020-05-12 MED ORDER — RUXOLITINIB 5 MG TABLET
ORAL_TABLET | Freq: Two times a day (BID) | ORAL | 11 refills | 30 days | Status: CP
Start: 2020-05-12 — End: 2020-06-11

## 2020-06-03 DIAGNOSIS — D471 Chronic myeloproliferative disease: Principal | ICD-10-CM

## 2020-06-07 DIAGNOSIS — D471 Chronic myeloproliferative disease: Secondary | ICD-10-CM | POA: Diagnosis not present

## 2020-06-27 ENCOUNTER — Telehealth: Payer: Self-pay

## 2020-06-27 NOTE — Telephone Encounter (Signed)
NOTES ON FILE 

## 2020-06-28 DIAGNOSIS — U071 COVID-19: Secondary | ICD-10-CM | POA: Diagnosis not present

## 2020-07-01 DIAGNOSIS — D471 Chronic myeloproliferative disease: Principal | ICD-10-CM

## 2020-07-08 ENCOUNTER — Other Ambulatory Visit: Admit: 2020-07-08 | Discharge: 2020-07-09 | Payer: PRIVATE HEALTH INSURANCE

## 2020-07-08 ENCOUNTER — Ambulatory Visit
Admit: 2020-07-08 | Discharge: 2020-07-09 | Payer: PRIVATE HEALTH INSURANCE | Attending: Adult Health | Primary: Adult Health

## 2020-07-08 DIAGNOSIS — D474 Osteomyelofibrosis: Principal | ICD-10-CM

## 2020-07-08 DIAGNOSIS — D473 Essential (hemorrhagic) thrombocythemia: Principal | ICD-10-CM

## 2020-07-08 DIAGNOSIS — D471 Chronic myeloproliferative disease: Principal | ICD-10-CM

## 2020-07-08 DIAGNOSIS — Z6825 Body mass index (BMI) 25.0-25.9, adult: Secondary | ICD-10-CM | POA: Diagnosis not present

## 2020-07-08 MED ORDER — PROCHLORPERAZINE MALEATE 5 MG TABLET
ORAL_TABLET | Freq: Three times a day (TID) | ORAL | 6 refills | 10 days | Status: CP | PRN
Start: 2020-07-08 — End: ?

## 2020-07-08 MED ORDER — RUXOLITINIB 5 MG TABLET
ORAL_TABLET | Freq: Every evening | ORAL | 2 refills | 30 days | Status: CP
Start: 2020-07-08 — End: 2020-08-07

## 2020-07-08 MED ORDER — RUXOLITINIB 15 MG TABLET
ORAL_TABLET | Freq: Two times a day (BID) | ORAL | 2 refills | 30 days | Status: CP
Start: 2020-07-08 — End: 2020-08-07

## 2020-07-10 DIAGNOSIS — S7001XA Contusion of right hip, initial encounter: Secondary | ICD-10-CM | POA: Diagnosis not present

## 2020-07-10 DIAGNOSIS — M7061 Trochanteric bursitis, right hip: Secondary | ICD-10-CM | POA: Diagnosis not present

## 2020-07-10 DIAGNOSIS — M25551 Pain in right hip: Secondary | ICD-10-CM | POA: Diagnosis not present

## 2020-07-29 DIAGNOSIS — D471 Chronic myeloproliferative disease: Principal | ICD-10-CM

## 2020-08-01 DIAGNOSIS — L570 Actinic keratosis: Secondary | ICD-10-CM | POA: Diagnosis not present

## 2020-08-01 DIAGNOSIS — L578 Other skin changes due to chronic exposure to nonionizing radiation: Secondary | ICD-10-CM | POA: Diagnosis not present

## 2020-08-01 DIAGNOSIS — D2271 Melanocytic nevi of right lower limb, including hip: Secondary | ICD-10-CM | POA: Diagnosis not present

## 2020-08-01 DIAGNOSIS — Z808 Family history of malignant neoplasm of other organs or systems: Secondary | ICD-10-CM | POA: Diagnosis not present

## 2020-08-01 DIAGNOSIS — Z85828 Personal history of other malignant neoplasm of skin: Secondary | ICD-10-CM | POA: Diagnosis not present

## 2020-08-01 DIAGNOSIS — D2272 Melanocytic nevi of left lower limb, including hip: Secondary | ICD-10-CM | POA: Diagnosis not present

## 2020-08-01 DIAGNOSIS — D225 Melanocytic nevi of trunk: Secondary | ICD-10-CM | POA: Diagnosis not present

## 2020-08-01 DIAGNOSIS — D2262 Melanocytic nevi of left upper limb, including shoulder: Secondary | ICD-10-CM | POA: Diagnosis not present

## 2020-08-01 DIAGNOSIS — Q828 Other specified congenital malformations of skin: Secondary | ICD-10-CM | POA: Diagnosis not present

## 2020-08-01 DIAGNOSIS — L821 Other seborrheic keratosis: Secondary | ICD-10-CM | POA: Diagnosis not present

## 2020-08-26 DIAGNOSIS — D471 Chronic myeloproliferative disease: Principal | ICD-10-CM

## 2020-08-27 DIAGNOSIS — D471 Chronic myeloproliferative disease: Secondary | ICD-10-CM | POA: Diagnosis not present

## 2020-09-01 DIAGNOSIS — D474 Osteomyelofibrosis: Principal | ICD-10-CM

## 2020-09-01 DIAGNOSIS — D473 Essential (hemorrhagic) thrombocythemia: Principal | ICD-10-CM

## 2020-09-01 MED ORDER — GABAPENTIN 100 MG CAPSULE
ORAL_CAPSULE | Freq: Two times a day (BID) | ORAL | 0 refills | 7.00000 days | Status: CP
Start: 2020-09-01 — End: 2021-09-01

## 2020-09-02 DIAGNOSIS — D474 Osteomyelofibrosis: Secondary | ICD-10-CM | POA: Diagnosis not present

## 2020-09-02 DIAGNOSIS — D473 Essential (hemorrhagic) thrombocythemia: Secondary | ICD-10-CM | POA: Diagnosis not present

## 2020-09-04 ENCOUNTER — Ambulatory Visit
Admit: 2020-09-04 | Discharge: 2020-09-05 | Payer: PRIVATE HEALTH INSURANCE | Attending: Adult Health | Primary: Adult Health

## 2020-09-04 ENCOUNTER — Ambulatory Visit: Admit: 2020-09-04 | Discharge: 2020-09-05 | Payer: PRIVATE HEALTH INSURANCE

## 2020-09-04 DIAGNOSIS — Z6826 Body mass index (BMI) 26.0-26.9, adult: Secondary | ICD-10-CM | POA: Diagnosis not present

## 2020-09-04 DIAGNOSIS — D474 Osteomyelofibrosis: Secondary | ICD-10-CM | POA: Diagnosis not present

## 2020-09-04 DIAGNOSIS — D473 Essential (hemorrhagic) thrombocythemia: Secondary | ICD-10-CM | POA: Diagnosis not present

## 2020-09-04 MED ORDER — GABAPENTIN 300 MG CAPSULE
ORAL_CAPSULE | Freq: Two times a day (BID) | ORAL | 1 refills | 30 days | Status: CP
Start: 2020-09-04 — End: 2021-09-04

## 2020-09-05 ENCOUNTER — Encounter: Payer: Self-pay | Admitting: Cardiovascular Disease

## 2020-09-05 ENCOUNTER — Ambulatory Visit: Payer: 59 | Admitting: Cardiovascular Disease

## 2020-09-05 ENCOUNTER — Other Ambulatory Visit: Payer: Self-pay

## 2020-09-05 VITALS — BP 130/68 | HR 80 | Ht 65.0 in | Wt 163.4 lb

## 2020-09-05 DIAGNOSIS — I340 Nonrheumatic mitral (valve) insufficiency: Secondary | ICD-10-CM | POA: Diagnosis not present

## 2020-09-05 DIAGNOSIS — R0602 Shortness of breath: Secondary | ICD-10-CM | POA: Diagnosis not present

## 2020-09-05 NOTE — Patient Instructions (Signed)
Medication Instructions:  Your physician recommends that you continue on your current medications as directed. Please refer to the Current Medication list given to you today.  *If you need a refill on your cardiac medications before your next appointment, please call your pharmacy*   Lab Work: None ordered  If you have labs (blood work) drawn today and your tests are completely normal, you will receive your results only by: [place] (if you have MyChart) OR A paper copy in the mail If you have any lab test that is abnormal or we need to change your treatment, we will call you to review the results.   Testing/Procedures: None ordered   Follow-Up: At [facility], you and your health needs are our priority.  As part of our continuing mission to provide you with exceptional heart care, we have created designated Provider Care Teams.  These Care Teams include your primary Cardiologist (physician) and Advanced Practice Providers (APPs -  Physician Assistants and Nurse Practitioners) who all work together to provide you with the care you need, when you need it.  We recommend signing up for the patient portal called "MyChart".  Sign up information is provided on this After Visit Summary.  MyChart is used to connect with patients for Virtual Visits (Telemedicine).  Patients are able to view lab/test results, encounter notes, upcoming appointments, etc.  Non-urgent messages can be sent to your provider as well.   To learn more about what you can do with MyChart, go to [email or web address].    Your next appointment:   AS NEEDED   The format for your next appointment:      Provider:   Lauree Chandler, MD   Other Instructions

## 2020-09-05 NOTE — Progress Notes (Signed)
Chief Complaint  Patient presents with   New Patient (Initial Visit)    Dyspnea   History of Present Illness: 50 yo female with history of iron deficiency anemia, post essential thrombocythemia myelofibrosis, splenomegaly, hepatomegaly, GERD, skin cancer, fibrocystic breast disease who is here today as a new patient for the evaluation of dyspnea. She is followed in [facility]. Recent dyspnea. Echo in March 2022 at [facility] with LVEF=55-60%, normal LV size and function. Normal RV size and function. Mild mitral regurgitation, trivial triscuspid regurgitation, trivial aortic insufficiency. RV pressures not estimated due to lack of TR jet.   She tells me today that she feels well. She has no chest pain or dizziness. Mostly back pain. She has had dyspnea for years. This is not lifestyle altering. No change over past few years.   Primary Care Physician: Marda Stalker, PA-C  Past Medical History:  Diagnosis Date   Bacterial infection    Basal cell carcinoma of skin 06/2011   Candida vaginitis    Fibrocystic breast    GERD (gastroesophageal reflux disease)    History of chicken pox    HPV (human papilloma virus) infection    Iron deficiency anemia    Myelofibrosis (HCC)    Ovarian cyst    Thrombocytosis    Vulvar inflammation    Yeast infection     Past Surgical History:  Procedure Laterality Date   BONE MARROW BIOPSY     SHOULDER SURGERY  11/2009    Current Outpatient Medications  Medication Sig Dispense Refill   Ferrous Fumarate (HEMOCYTE PO) Take 1 tablet by mouth daily. Reported on 07/22/2015     fluticasone (FLONASE) 50 MCG/ACT nasal spray Place 1 spray into both nostrils daily.     gabapentin (NEURONTIN) 300 MG capsule Take 300 mg by mouth 2 (two) times daily.     JAKAFI 15 MG tablet Take 15 mg by mouth 2 (two) times daily.     Multiple Vitamin (MULTIVITAMIN) tablet Take 1 tablet by mouth daily.     omeprazole (PRILOSEC) 20 MG capsule Take 20 mg by mouth daily.      prochlorperazine (COMPAZINE) 5 MG tablet Take 5 mg by mouth as needed.     ruxolitinib phosphate (JAKAFI) 20 MG tablet Take 20 mg by mouth 2 (two) times daily.     sertraline (ZOLOFT) 50 MG tablet Take 50 mg by mouth daily.     Multiple Vitamins-Minerals (IMMUNE SYSTEM BOOSTER PO) Take 3 tablets by mouth daily.     traMADol (ULTRAM) 50 MG tablet Take 0.5-1 tablets (25-50 mg total) by mouth every 8 (eight) hours as needed for moderate pain. Do not drive while taking this medication. 30 tablet 0   No current facility-administered medications for this visit.    Allergies  Allergen Reactions   Doxycycline Nausea Only    Stomach cramps   Paxil [Paroxetine Hcl] Other (See Comments)    Made her dazed    Social History   Socioeconomic History   Marital status: Married    Spouse name: Not on file   Number of children: 2   Years of education: Not on file   Highest education level: Not on file  Occupational History   Occupation: Acupuncturist  Tobacco Use   Smoking status: Never   Smokeless tobacco: Never  Substance and Sexual Activity   Alcohol use: Yes   Drug use: No   Sexual activity: Yes    Birth control/protection: Surgical    Comment: VASECTOMY  Other Topics Concern   Not on file  Social History Narrative   Not on file   Social Determinants of Health   Financial Resource Strain: Not on file  Food Insecurity: Not on file  Transportation Needs: Not on file  Physical Activity: Not on file  Stress: Not on file  Social Connections: Not on file  Intimate Partner Violence: Not on file    Family History  Problem Relation Age of Onset   Heart disease Paternal Grandfather    COPD Paternal Grandfather    Alcohol abuse Paternal Grandfather    Hypertension Maternal Grandmother    Diabetes Maternal Grandmother    Cancer Maternal Grandmother        BREAST   Breast cancer Maternal Grandmother 68   Hypertension Mother    Cancer Mother        BREAST   Breast  cancer Mother 34   Depression Maternal Grandfather    Alcohol abuse Paternal Aunt    Alcohol abuse Father     Review of Systems:  As stated in the HPI and otherwise negative.   BP 130/68   Pulse 80   Ht 5' 5"  (1.651 m)   Wt 163 lb 6.4 oz (74.1 kg)   SpO2 98%   BMI 27.19 kg/m   Physical Examination: General: Well developed, well nourished, NAD  HEENT: OP clear, mucus membranes moist  SKIN: warm, dry. No rashes. Neuro: No focal deficits  Musculoskeletal: Muscle strength 5/5 all ext  Psychiatric: Mood and affect normal  Neck: No JVD, no carotid bruits, no thyromegaly, no lymphadenopathy.  Lungs:Clear bilaterally, no wheezes, rhonci, crackles Cardiovascular: Regular rate and rhythm. No murmurs, gallops or rubs. Abdomen:Soft. Bowel sounds present. Non-tender.  Extremities: No lower extremity edema. Pulses are 2 + in the bilateral DP/PT.  EKG:  EKG is ordered today. The ekg ordered today demonstrates sinus  Recent Labs: 10/24/2019: ALT 16; BUN 9; Creatinine 0.66; Potassium 3.8; Sodium 144 11/09/2019: Hemoglobin 10.4; Platelet Count 340   Lipid Panel No results found for: CHOL, TRIG, HDL, CHOLHDL, VLDL, LDLCALC, LDLDIRECT   Wt Readings from Last 3 Encounters:  09/05/20 163 lb 6.4 oz (74.1 kg)  09/28/19 154 lb (69.9 kg)  06/20/19 143 lb (64.9 kg)      Assessment and Plan:   1. Dyspnea: Mild dyspnea at times. Echo report from [facility] is reviewed. Normal LV and RV function. Trivial TR, mild MR. No suggestion of pulmonary HTN. If there were pulmonary HTN, we would typically see a TR jet to measure. I have offered to repeat her echo with saline contrast to better define the TR jet but she does not wish to do this as she does not feel poorly. I would not proceed with further workup at this time.   Current medicines are reviewed at length with the patient today.  The patient does not have concerns regarding medicines.  The following changes have been made:  no  change  Labs/ tests ordered today include:   Orders Placed This Encounter  Procedures   EKG 12-Lead      Disposition:   F/U with me prn.     Signed, Lauree Chandler, MD 09/05/2020 1:34 PM    [facility] [address], [place], [place]  [address] Phone: [phone]; Fax: [phone]

## 2020-09-18 DIAGNOSIS — D474 Osteomyelofibrosis: Principal | ICD-10-CM

## 2020-09-18 DIAGNOSIS — R161 Splenomegaly, not elsewhere classified: Principal | ICD-10-CM

## 2020-09-18 DIAGNOSIS — D473 Essential (hemorrhagic) thrombocythemia: Principal | ICD-10-CM

## 2020-09-18 DIAGNOSIS — M5489 Other dorsalgia: Principal | ICD-10-CM

## 2020-09-19 DIAGNOSIS — M545 Low back pain, unspecified: Secondary | ICD-10-CM | POA: Diagnosis not present

## 2020-09-19 DIAGNOSIS — M791 Myalgia, unspecified site: Secondary | ICD-10-CM | POA: Diagnosis not present

## 2020-09-19 DIAGNOSIS — M25551 Pain in right hip: Secondary | ICD-10-CM | POA: Diagnosis not present

## 2020-09-19 DIAGNOSIS — R293 Abnormal posture: Secondary | ICD-10-CM | POA: Diagnosis not present

## 2020-09-20 DIAGNOSIS — D473 Essential (hemorrhagic) thrombocythemia: Principal | ICD-10-CM

## 2020-09-20 DIAGNOSIS — D474 Osteomyelofibrosis: Principal | ICD-10-CM

## 2020-09-20 MED ORDER — TRAMADOL 50 MG TABLET
ORAL_TABLET | Freq: Three times a day (TID) | ORAL | 0 refills | 14 days | Status: CP | PRN
Start: 2020-09-20 — End: 2020-10-04

## 2020-09-20 MED ORDER — LIDOCAINE 5 % TOPICAL PATCH
MEDICATED_PATCH | Freq: Two times a day (BID) | TRANSDERMAL | 0 refills | 5.00000 days | Status: CP
Start: 2020-09-20 — End: 2021-09-20

## 2020-09-20 MED ORDER — RUXOLITINIB 10 MG TABLET
ORAL_TABLET | Freq: Two times a day (BID) | ORAL | 1 refills | 30 days | Status: CP
Start: 2020-09-20 — End: 2020-10-20

## 2020-09-23 DIAGNOSIS — D471 Chronic myeloproliferative disease: Principal | ICD-10-CM

## 2020-09-24 DIAGNOSIS — R161 Splenomegaly, not elsewhere classified: Principal | ICD-10-CM

## 2020-09-25 ENCOUNTER — Ambulatory Visit: Admit: 2020-09-25 | Discharge: 2020-09-26 | Payer: PRIVATE HEALTH INSURANCE

## 2020-09-26 DIAGNOSIS — R1900 Intra-abdominal and pelvic swelling, mass and lump, unspecified site: Principal | ICD-10-CM

## 2020-09-26 DIAGNOSIS — R293 Abnormal posture: Secondary | ICD-10-CM | POA: Diagnosis not present

## 2020-09-26 DIAGNOSIS — M25551 Pain in right hip: Secondary | ICD-10-CM | POA: Diagnosis not present

## 2020-09-26 DIAGNOSIS — M545 Low back pain, unspecified: Secondary | ICD-10-CM | POA: Diagnosis not present

## 2020-09-26 DIAGNOSIS — M791 Myalgia, unspecified site: Secondary | ICD-10-CM | POA: Diagnosis not present

## 2020-10-01 DIAGNOSIS — R293 Abnormal posture: Secondary | ICD-10-CM | POA: Diagnosis not present

## 2020-10-01 DIAGNOSIS — M791 Myalgia, unspecified site: Secondary | ICD-10-CM | POA: Diagnosis not present

## 2020-10-01 DIAGNOSIS — M25551 Pain in right hip: Secondary | ICD-10-CM | POA: Diagnosis not present

## 2020-10-01 DIAGNOSIS — M545 Low back pain, unspecified: Secondary | ICD-10-CM | POA: Diagnosis not present

## 2020-10-02 DIAGNOSIS — Z01419 Encounter for gynecological examination (general) (routine) without abnormal findings: Secondary | ICD-10-CM | POA: Diagnosis not present

## 2020-10-02 DIAGNOSIS — D7581 Myelofibrosis: Secondary | ICD-10-CM | POA: Diagnosis not present

## 2020-10-02 DIAGNOSIS — R1900 Intra-abdominal and pelvic swelling, mass and lump, unspecified site: Secondary | ICD-10-CM | POA: Diagnosis not present

## 2020-10-02 DIAGNOSIS — N915 Oligomenorrhea, unspecified: Secondary | ICD-10-CM | POA: Diagnosis not present

## 2020-10-02 DIAGNOSIS — Z6825 Body mass index (BMI) 25.0-25.9, adult: Secondary | ICD-10-CM | POA: Diagnosis not present

## 2020-10-03 ENCOUNTER — Ambulatory Visit: Admit: 2020-10-03 | Discharge: 2020-10-04 | Payer: PRIVATE HEALTH INSURANCE

## 2020-10-03 DIAGNOSIS — R1900 Intra-abdominal and pelvic swelling, mass and lump, unspecified site: Secondary | ICD-10-CM | POA: Diagnosis not present

## 2020-10-03 DIAGNOSIS — R162 Hepatomegaly with splenomegaly, not elsewhere classified: Secondary | ICD-10-CM | POA: Diagnosis not present

## 2020-10-03 DIAGNOSIS — N83292 Other ovarian cyst, left side: Secondary | ICD-10-CM | POA: Diagnosis not present

## 2020-10-03 DIAGNOSIS — N83291 Other ovarian cyst, right side: Secondary | ICD-10-CM | POA: Diagnosis not present

## 2020-10-07 ENCOUNTER — Other Ambulatory Visit: Admit: 2020-10-07 | Discharge: 2020-10-08 | Payer: PRIVATE HEALTH INSURANCE

## 2020-10-07 ENCOUNTER — Ambulatory Visit
Admit: 2020-10-07 | Discharge: 2020-10-08 | Payer: PRIVATE HEALTH INSURANCE | Attending: Internal Medicine | Primary: Internal Medicine

## 2020-10-07 DIAGNOSIS — D474 Osteomyelofibrosis: Principal | ICD-10-CM

## 2020-10-07 DIAGNOSIS — Z79899 Other long term (current) drug therapy: Principal | ICD-10-CM

## 2020-10-07 DIAGNOSIS — D473 Essential (hemorrhagic) thrombocythemia: Principal | ICD-10-CM

## 2020-10-07 DIAGNOSIS — L03039 Cellulitis of unspecified toe: Secondary | ICD-10-CM | POA: Diagnosis not present

## 2020-10-07 DIAGNOSIS — R161 Splenomegaly, not elsewhere classified: Secondary | ICD-10-CM | POA: Diagnosis not present

## 2020-10-07 DIAGNOSIS — Z6827 Body mass index (BMI) 27.0-27.9, adult: Secondary | ICD-10-CM | POA: Diagnosis not present

## 2020-10-07 MED ORDER — PACRITINIB 100 MG CAPSULE
ORAL_CAPSULE | Freq: Two times a day (BID) | ORAL | 2 refills | 30 days | Status: CP
Start: 2020-10-07 — End: ?

## 2020-10-08 DIAGNOSIS — R293 Abnormal posture: Secondary | ICD-10-CM | POA: Diagnosis not present

## 2020-10-08 DIAGNOSIS — M791 Myalgia, unspecified site: Secondary | ICD-10-CM | POA: Diagnosis not present

## 2020-10-08 DIAGNOSIS — M545 Low back pain, unspecified: Secondary | ICD-10-CM | POA: Diagnosis not present

## 2020-10-08 DIAGNOSIS — M25551 Pain in right hip: Secondary | ICD-10-CM | POA: Diagnosis not present

## 2020-10-09 DIAGNOSIS — M545 Low back pain, unspecified: Secondary | ICD-10-CM | POA: Diagnosis not present

## 2020-10-09 DIAGNOSIS — R293 Abnormal posture: Secondary | ICD-10-CM | POA: Diagnosis not present

## 2020-10-09 DIAGNOSIS — M791 Myalgia, unspecified site: Secondary | ICD-10-CM | POA: Diagnosis not present

## 2020-10-09 DIAGNOSIS — M25551 Pain in right hip: Secondary | ICD-10-CM | POA: Diagnosis not present

## 2020-10-09 MED ORDER — CEPHALEXIN 500 MG CAPSULE
ORAL_CAPSULE | Freq: Four times a day (QID) | ORAL | 0 refills | 5 days | Status: CP
Start: 2020-10-09 — End: 2020-10-14

## 2020-10-15 DIAGNOSIS — M25551 Pain in right hip: Secondary | ICD-10-CM | POA: Diagnosis not present

## 2020-10-15 DIAGNOSIS — R293 Abnormal posture: Secondary | ICD-10-CM | POA: Diagnosis not present

## 2020-10-15 DIAGNOSIS — M545 Low back pain, unspecified: Secondary | ICD-10-CM | POA: Diagnosis not present

## 2020-10-15 DIAGNOSIS — M791 Myalgia, unspecified site: Secondary | ICD-10-CM | POA: Diagnosis not present

## 2020-10-22 DIAGNOSIS — D473 Essential (hemorrhagic) thrombocythemia: Principal | ICD-10-CM

## 2020-10-22 DIAGNOSIS — D474 Osteomyelofibrosis: Principal | ICD-10-CM

## 2020-10-22 DIAGNOSIS — D7581 Myelofibrosis: Secondary | ICD-10-CM | POA: Diagnosis not present

## 2020-10-22 DIAGNOSIS — R059 Cough, unspecified: Secondary | ICD-10-CM | POA: Diagnosis not present

## 2020-10-22 DIAGNOSIS — M545 Low back pain, unspecified: Secondary | ICD-10-CM | POA: Diagnosis not present

## 2020-10-22 MED ORDER — PACRITINIB 100 MG CAPSULE
ORAL_CAPSULE | Freq: Two times a day (BID) | ORAL | 2 refills | 30.00000 days | Status: CP
Start: 2020-10-22 — End: 2020-10-22

## 2020-10-30 DIAGNOSIS — M25551 Pain in right hip: Secondary | ICD-10-CM | POA: Diagnosis not present

## 2020-10-30 DIAGNOSIS — M545 Low back pain, unspecified: Secondary | ICD-10-CM | POA: Diagnosis not present

## 2020-10-30 DIAGNOSIS — R293 Abnormal posture: Secondary | ICD-10-CM | POA: Diagnosis not present

## 2020-10-30 DIAGNOSIS — M791 Myalgia, unspecified site: Secondary | ICD-10-CM | POA: Diagnosis not present

## 2020-11-08 MED ORDER — GABAPENTIN 300 MG CAPSULE
ORAL_CAPSULE | Freq: Two times a day (BID) | ORAL | 6 refills | 30.00000 days | Status: CP
Start: 2020-11-08 — End: 2021-11-08

## 2020-11-12 ENCOUNTER — Other Ambulatory Visit: Payer: Self-pay | Admitting: Obstetrics and Gynecology

## 2020-11-12 DIAGNOSIS — Z1231 Encounter for screening mammogram for malignant neoplasm of breast: Secondary | ICD-10-CM

## 2020-11-13 DIAGNOSIS — M545 Low back pain, unspecified: Secondary | ICD-10-CM | POA: Diagnosis not present

## 2020-11-13 DIAGNOSIS — R293 Abnormal posture: Secondary | ICD-10-CM | POA: Diagnosis not present

## 2020-11-13 DIAGNOSIS — M25551 Pain in right hip: Secondary | ICD-10-CM | POA: Diagnosis not present

## 2020-11-13 DIAGNOSIS — M791 Myalgia, unspecified site: Secondary | ICD-10-CM | POA: Diagnosis not present

## 2020-11-19 DIAGNOSIS — M25551 Pain in right hip: Secondary | ICD-10-CM | POA: Diagnosis not present

## 2020-11-19 DIAGNOSIS — R293 Abnormal posture: Secondary | ICD-10-CM | POA: Diagnosis not present

## 2020-11-19 DIAGNOSIS — M545 Low back pain, unspecified: Secondary | ICD-10-CM | POA: Diagnosis not present

## 2020-11-19 DIAGNOSIS — M791 Myalgia, unspecified site: Secondary | ICD-10-CM | POA: Diagnosis not present

## 2020-11-29 ENCOUNTER — Other Ambulatory Visit: Payer: Self-pay

## 2020-11-29 ENCOUNTER — Ambulatory Visit
Admission: RE | Admit: 2020-11-29 | Discharge: 2020-11-29 | Disposition: A | Discharge status: Home / Self Care | Payer: 59 | Source: Ambulatory Visit | Attending: Obstetrics and Gynecology | Admitting: Obstetrics and Gynecology

## 2020-11-29 DIAGNOSIS — Z1231 Encounter for screening mammogram for malignant neoplasm of breast: Secondary | ICD-10-CM

## 2020-12-03 DIAGNOSIS — R293 Abnormal posture: Secondary | ICD-10-CM | POA: Diagnosis not present

## 2020-12-03 DIAGNOSIS — M791 Myalgia, unspecified site: Secondary | ICD-10-CM | POA: Diagnosis not present

## 2020-12-03 DIAGNOSIS — M25551 Pain in right hip: Secondary | ICD-10-CM | POA: Diagnosis not present

## 2020-12-03 DIAGNOSIS — M545 Low back pain, unspecified: Secondary | ICD-10-CM | POA: Diagnosis not present

## 2020-12-09 ENCOUNTER — Ambulatory Visit: Admit: 2020-12-09 | Discharge: 2020-12-09 | Payer: PRIVATE HEALTH INSURANCE

## 2020-12-09 ENCOUNTER — Ambulatory Visit
Admit: 2020-12-09 | Discharge: 2020-12-09 | Payer: PRIVATE HEALTH INSURANCE | Attending: Adult Health | Primary: Adult Health

## 2020-12-09 ENCOUNTER — Other Ambulatory Visit: Admit: 2020-12-09 | Discharge: 2020-12-09 | Payer: PRIVATE HEALTH INSURANCE

## 2020-12-09 DIAGNOSIS — L750 Bromhidrosis: Principal | ICD-10-CM

## 2020-12-09 DIAGNOSIS — D473 Essential (hemorrhagic) thrombocythemia: Principal | ICD-10-CM

## 2020-12-09 DIAGNOSIS — D474 Osteomyelofibrosis: Principal | ICD-10-CM

## 2020-12-09 DIAGNOSIS — Z79899 Other long term (current) drug therapy: Secondary | ICD-10-CM | POA: Diagnosis not present

## 2020-12-09 DIAGNOSIS — E611 Iron deficiency: Secondary | ICD-10-CM | POA: Diagnosis not present

## 2020-12-09 DIAGNOSIS — Z7982 Long term (current) use of aspirin: Secondary | ICD-10-CM | POA: Diagnosis not present

## 2020-12-09 DIAGNOSIS — I071 Rheumatic tricuspid insufficiency: Secondary | ICD-10-CM | POA: Diagnosis not present

## 2020-12-09 DIAGNOSIS — D649 Anemia, unspecified: Secondary | ICD-10-CM | POA: Diagnosis not present

## 2020-12-09 DIAGNOSIS — K219 Gastro-esophageal reflux disease without esophagitis: Secondary | ICD-10-CM | POA: Diagnosis not present

## 2020-12-09 DIAGNOSIS — M79662 Pain in left lower leg: Secondary | ICD-10-CM | POA: Diagnosis not present

## 2020-12-09 DIAGNOSIS — Z6828 Body mass index (BMI) 28.0-28.9, adult: Secondary | ICD-10-CM | POA: Diagnosis not present

## 2020-12-12 MED ORDER — DULOXETINE 20 MG CAPSULE,DELAYED RELEASE
ORAL_CAPSULE | Freq: Every day | ORAL | 1 refills | 30 days | Status: CP
Start: 2020-12-12 — End: ?

## 2020-12-17 DIAGNOSIS — M25512 Pain in left shoulder: Secondary | ICD-10-CM | POA: Diagnosis not present

## 2020-12-17 DIAGNOSIS — M791 Myalgia, unspecified site: Secondary | ICD-10-CM | POA: Diagnosis not present

## 2020-12-17 DIAGNOSIS — R293 Abnormal posture: Secondary | ICD-10-CM | POA: Diagnosis not present

## 2020-12-30 DIAGNOSIS — D473 Essential (hemorrhagic) thrombocythemia: Principal | ICD-10-CM

## 2020-12-30 DIAGNOSIS — D474 Osteomyelofibrosis: Principal | ICD-10-CM

## 2020-12-31 DIAGNOSIS — D474 Osteomyelofibrosis: Principal | ICD-10-CM

## 2020-12-31 DIAGNOSIS — D473 Essential (hemorrhagic) thrombocythemia: Principal | ICD-10-CM

## 2020-12-31 DIAGNOSIS — R293 Abnormal posture: Secondary | ICD-10-CM | POA: Diagnosis not present

## 2020-12-31 DIAGNOSIS — M791 Myalgia, unspecified site: Secondary | ICD-10-CM | POA: Diagnosis not present

## 2020-12-31 DIAGNOSIS — M25512 Pain in left shoulder: Secondary | ICD-10-CM | POA: Diagnosis not present

## 2021-01-01 MED ORDER — GABAPENTIN 300 MG CAPSULE
ORAL_CAPSULE | Freq: Two times a day (BID) | ORAL | 6 refills | 30.00000 days | Status: CP
Start: 2021-01-01 — End: ?

## 2021-01-02 DIAGNOSIS — D473 Essential (hemorrhagic) thrombocythemia: Principal | ICD-10-CM

## 2021-01-02 DIAGNOSIS — D474 Osteomyelofibrosis: Principal | ICD-10-CM

## 2021-01-02 DIAGNOSIS — D471 Chronic myeloproliferative disease: Secondary | ICD-10-CM | POA: Diagnosis not present

## 2021-01-15 DIAGNOSIS — D473 Essential (hemorrhagic) thrombocythemia: Principal | ICD-10-CM

## 2021-01-15 DIAGNOSIS — D474 Osteomyelofibrosis: Principal | ICD-10-CM

## 2021-01-15 DIAGNOSIS — G893 Neoplasm related pain (acute) (chronic): Principal | ICD-10-CM

## 2021-01-16 DIAGNOSIS — R293 Abnormal posture: Secondary | ICD-10-CM | POA: Diagnosis not present

## 2021-01-16 DIAGNOSIS — M791 Myalgia, unspecified site: Secondary | ICD-10-CM | POA: Diagnosis not present

## 2021-01-16 DIAGNOSIS — M25512 Pain in left shoulder: Secondary | ICD-10-CM | POA: Diagnosis not present

## 2021-01-20 ENCOUNTER — Ambulatory Visit: Admit: 2021-01-20 | Discharge: 2021-01-20 | Payer: PRIVATE HEALTH INSURANCE

## 2021-01-20 ENCOUNTER — Other Ambulatory Visit: Admit: 2021-01-20 | Discharge: 2021-01-20 | Payer: PRIVATE HEALTH INSURANCE

## 2021-01-20 ENCOUNTER — Ambulatory Visit
Admit: 2021-01-20 | Discharge: 2021-01-20 | Payer: PRIVATE HEALTH INSURANCE | Attending: Adult Health | Primary: Adult Health

## 2021-01-20 DIAGNOSIS — R161 Splenomegaly, not elsewhere classified: Principal | ICD-10-CM

## 2021-01-20 DIAGNOSIS — D474 Osteomyelofibrosis: Principal | ICD-10-CM

## 2021-01-20 DIAGNOSIS — D473 Essential (hemorrhagic) thrombocythemia: Principal | ICD-10-CM

## 2021-01-20 DIAGNOSIS — Z79899 Other long term (current) drug therapy: Secondary | ICD-10-CM | POA: Diagnosis not present

## 2021-01-20 DIAGNOSIS — Z20822 Contact with and (suspected) exposure to covid-19: Secondary | ICD-10-CM | POA: Diagnosis not present

## 2021-01-20 DIAGNOSIS — Z6828 Body mass index (BMI) 28.0-28.9, adult: Secondary | ICD-10-CM | POA: Diagnosis not present

## 2021-01-20 DIAGNOSIS — Z7982 Long term (current) use of aspirin: Secondary | ICD-10-CM | POA: Diagnosis not present

## 2021-01-20 MED ORDER — GABAPENTIN 600 MG TABLET
ORAL_TABLET | Freq: Three times a day (TID) | ORAL | 0 refills | 30 days | Status: CP
Start: 2021-01-20 — End: 2021-02-19

## 2021-01-21 DIAGNOSIS — M791 Myalgia, unspecified site: Secondary | ICD-10-CM | POA: Diagnosis not present

## 2021-01-21 DIAGNOSIS — M25512 Pain in left shoulder: Secondary | ICD-10-CM | POA: Diagnosis not present

## 2021-01-21 DIAGNOSIS — R293 Abnormal posture: Secondary | ICD-10-CM | POA: Diagnosis not present

## 2021-01-21 MED ORDER — PACRITINIB 100 MG CAPSULE
ORAL_CAPSULE | Freq: Two times a day (BID) | ORAL | 2 refills | 30 days | Status: CP
Start: 2021-01-21 — End: ?

## 2021-01-23 DIAGNOSIS — G893 Neoplasm related pain (acute) (chronic): Principal | ICD-10-CM

## 2021-01-23 DIAGNOSIS — D473 Essential (hemorrhagic) thrombocythemia: Principal | ICD-10-CM

## 2021-01-23 DIAGNOSIS — D474 Osteomyelofibrosis: Principal | ICD-10-CM

## 2021-01-28 DIAGNOSIS — M791 Myalgia, unspecified site: Secondary | ICD-10-CM | POA: Diagnosis not present

## 2021-01-28 DIAGNOSIS — R293 Abnormal posture: Secondary | ICD-10-CM | POA: Diagnosis not present

## 2021-01-28 DIAGNOSIS — M25512 Pain in left shoulder: Secondary | ICD-10-CM | POA: Diagnosis not present

## 2021-01-30 DIAGNOSIS — Z85828 Personal history of other malignant neoplasm of skin: Secondary | ICD-10-CM | POA: Diagnosis not present

## 2021-01-30 DIAGNOSIS — L821 Other seborrheic keratosis: Secondary | ICD-10-CM | POA: Diagnosis not present

## 2021-01-30 DIAGNOSIS — L820 Inflamed seborrheic keratosis: Secondary | ICD-10-CM | POA: Diagnosis not present

## 2021-01-30 DIAGNOSIS — Q828 Other specified congenital malformations of skin: Secondary | ICD-10-CM | POA: Diagnosis not present

## 2021-01-30 DIAGNOSIS — L578 Other skin changes due to chronic exposure to nonionizing radiation: Secondary | ICD-10-CM | POA: Diagnosis not present

## 2021-01-30 DIAGNOSIS — D2262 Melanocytic nevi of left upper limb, including shoulder: Secondary | ICD-10-CM | POA: Diagnosis not present

## 2021-01-30 DIAGNOSIS — D225 Melanocytic nevi of trunk: Secondary | ICD-10-CM | POA: Diagnosis not present

## 2021-01-30 DIAGNOSIS — L570 Actinic keratosis: Secondary | ICD-10-CM | POA: Diagnosis not present

## 2021-01-30 DIAGNOSIS — L731 Pseudofolliculitis barbae: Secondary | ICD-10-CM | POA: Diagnosis not present

## 2021-01-30 DIAGNOSIS — Z808 Family history of malignant neoplasm of other organs or systems: Secondary | ICD-10-CM | POA: Diagnosis not present

## 2021-01-30 DIAGNOSIS — B353 Tinea pedis: Secondary | ICD-10-CM | POA: Diagnosis not present

## 2021-01-30 DIAGNOSIS — D2271 Melanocytic nevi of right lower limb, including hip: Secondary | ICD-10-CM | POA: Diagnosis not present

## 2021-01-31 ENCOUNTER — Other Ambulatory Visit: Payer: Self-pay | Admitting: Adult Health Nurse Practitioner

## 2021-01-31 DIAGNOSIS — D473 Essential (hemorrhagic) thrombocythemia: Secondary | ICD-10-CM

## 2021-01-31 DIAGNOSIS — D474 Osteomyelofibrosis: Secondary | ICD-10-CM

## 2021-01-31 DIAGNOSIS — R161 Splenomegaly, not elsewhere classified: Secondary | ICD-10-CM

## 2021-02-02 MED ORDER — PROCHLORPERAZINE MALEATE 5 MG TABLET
ORAL_TABLET | Freq: Three times a day (TID) | ORAL | 6 refills | 20 days | Status: CP | PRN
Start: 2021-02-02 — End: ?

## 2021-02-07 DIAGNOSIS — M25512 Pain in left shoulder: Secondary | ICD-10-CM | POA: Diagnosis not present

## 2021-02-10 DIAGNOSIS — Z03818 Encounter for observation for suspected exposure to other biological agents ruled out: Secondary | ICD-10-CM | POA: Diagnosis not present

## 2021-02-10 DIAGNOSIS — R509 Fever, unspecified: Secondary | ICD-10-CM | POA: Diagnosis not present

## 2021-02-10 DIAGNOSIS — D7581 Myelofibrosis: Secondary | ICD-10-CM | POA: Diagnosis not present

## 2021-02-12 ENCOUNTER — Ambulatory Visit
Admission: RE | Admit: 2021-02-12 | Discharge: 2021-02-12 | Disposition: A | Discharge status: Home / Self Care | Payer: 59 | Source: Ambulatory Visit | Attending: Adult Health Nurse Practitioner | Admitting: Adult Health Nurse Practitioner

## 2021-02-12 DIAGNOSIS — R161 Splenomegaly, not elsewhere classified: Secondary | ICD-10-CM | POA: Diagnosis not present

## 2021-02-12 DIAGNOSIS — D474 Osteomyelofibrosis: Secondary | ICD-10-CM

## 2021-02-16 DIAGNOSIS — M25512 Pain in left shoulder: Secondary | ICD-10-CM | POA: Diagnosis not present

## 2021-02-20 DIAGNOSIS — D471 Chronic myeloproliferative disease: Secondary | ICD-10-CM | POA: Diagnosis not present

## 2021-02-24 DIAGNOSIS — M25512 Pain in left shoulder: Principal | ICD-10-CM

## 2021-02-24 DIAGNOSIS — G8929 Other chronic pain: Principal | ICD-10-CM

## 2021-02-24 DIAGNOSIS — M7502 Adhesive capsulitis of left shoulder: Secondary | ICD-10-CM | POA: Diagnosis not present

## 2021-02-24 DIAGNOSIS — M7532 Calcific tendinitis of left shoulder: Secondary | ICD-10-CM | POA: Diagnosis not present

## 2021-02-24 MED ORDER — GABAPENTIN 600 MG TABLET
ORAL_TABLET | Freq: Three times a day (TID) | ORAL | 2 refills | 30 days | Status: CP
Start: 2021-02-24 — End: 2021-05-25

## 2021-02-25 ENCOUNTER — Telehealth
Admit: 2021-02-25 | Discharge: 2021-02-26 | Payer: PRIVATE HEALTH INSURANCE | Attending: Adult Health | Primary: Adult Health

## 2021-02-25 DIAGNOSIS — D473 Essential (hemorrhagic) thrombocythemia: Principal | ICD-10-CM

## 2021-02-25 DIAGNOSIS — D474 Osteomyelofibrosis: Principal | ICD-10-CM

## 2021-02-25 DIAGNOSIS — N898 Other specified noninflammatory disorders of vagina: Secondary | ICD-10-CM | POA: Diagnosis not present

## 2021-02-25 DIAGNOSIS — N8189 Other female genital prolapse: Secondary | ICD-10-CM | POA: Diagnosis not present

## 2021-02-25 DIAGNOSIS — N3010 Interstitial cystitis (chronic) without hematuria: Secondary | ICD-10-CM | POA: Diagnosis not present

## 2021-02-25 DIAGNOSIS — N941 Unspecified dyspareunia: Secondary | ICD-10-CM | POA: Diagnosis not present

## 2021-02-25 MED ORDER — RUXOLITINIB 10 MG TABLET
ORAL_TABLET | Freq: Two times a day (BID) | ORAL | 1 refills | 30.00000 days | Status: CP
Start: 2021-02-25 — End: 2021-03-27

## 2021-02-27 MED ORDER — RUXOLITINIB 15 MG TABLET
ORAL_TABLET | Freq: Two times a day (BID) | ORAL | 1 refills | 30 days | Status: CP
Start: 2021-02-27 — End: 2021-03-29

## 2021-03-04 DIAGNOSIS — D474 Osteomyelofibrosis: Principal | ICD-10-CM

## 2021-03-04 DIAGNOSIS — D473 Essential (hemorrhagic) thrombocythemia: Principal | ICD-10-CM

## 2021-03-04 MED ORDER — RUXOLITINIB 15 MG TABLET
ORAL_TABLET | Freq: Two times a day (BID) | ORAL | 1 refills | 30 days | Status: CP
Start: 2021-03-04 — End: 2021-04-03

## 2021-03-13 ENCOUNTER — Ambulatory Visit: Payer: 59 | Admitting: Student in an Organized Health Care Education/Training Program

## 2021-03-21 DIAGNOSIS — M7502 Adhesive capsulitis of left shoulder: Secondary | ICD-10-CM | POA: Diagnosis not present

## 2021-03-21 DIAGNOSIS — M7532 Calcific tendinitis of left shoulder: Secondary | ICD-10-CM | POA: Diagnosis not present

## 2021-03-25 DIAGNOSIS — N9412 Deep dyspareunia: Secondary | ICD-10-CM | POA: Diagnosis not present

## 2021-03-31 ENCOUNTER — Ambulatory Visit
Admit: 2021-03-31 | Discharge: 2021-04-01 | Payer: PRIVATE HEALTH INSURANCE | Attending: Adult Health | Primary: Adult Health

## 2021-03-31 ENCOUNTER — Ambulatory Visit
Admit: 2021-03-31 | Discharge: 2021-04-01 | Payer: PRIVATE HEALTH INSURANCE | Attending: Internal Medicine | Primary: Internal Medicine

## 2021-03-31 ENCOUNTER — Other Ambulatory Visit: Admit: 2021-03-31 | Discharge: 2021-04-01 | Payer: PRIVATE HEALTH INSURANCE

## 2021-03-31 ENCOUNTER — Ambulatory Visit: Admit: 2021-03-31 | Discharge: 2021-04-01 | Payer: PRIVATE HEALTH INSURANCE

## 2021-03-31 DIAGNOSIS — M25542 Pain in joints of left hand: Principal | ICD-10-CM

## 2021-03-31 DIAGNOSIS — D474 Osteomyelofibrosis: Principal | ICD-10-CM

## 2021-03-31 DIAGNOSIS — D509 Iron deficiency anemia, unspecified: Principal | ICD-10-CM

## 2021-03-31 DIAGNOSIS — D473 Essential (hemorrhagic) thrombocythemia: Principal | ICD-10-CM

## 2021-03-31 DIAGNOSIS — M25541 Pain in joints of right hand: Principal | ICD-10-CM

## 2021-03-31 MED ORDER — RUXOLITINIB 20 MG TABLET
ORAL_TABLET | Freq: Two times a day (BID) | ORAL | 3 refills | 30 days | Status: CP
Start: 2021-03-31 — End: 2021-04-30

## 2021-04-01 DIAGNOSIS — M2550 Pain in unspecified joint: Principal | ICD-10-CM

## 2021-04-08 DIAGNOSIS — M79671 Pain in right foot: Secondary | ICD-10-CM | POA: Diagnosis not present

## 2021-04-08 DIAGNOSIS — D7581 Myelofibrosis: Secondary | ICD-10-CM | POA: Diagnosis not present

## 2021-04-08 DIAGNOSIS — M25512 Pain in left shoulder: Secondary | ICD-10-CM | POA: Diagnosis not present

## 2021-04-08 DIAGNOSIS — M79641 Pain in right hand: Secondary | ICD-10-CM | POA: Diagnosis not present

## 2021-04-08 DIAGNOSIS — M79642 Pain in left hand: Secondary | ICD-10-CM | POA: Diagnosis not present

## 2021-04-08 DIAGNOSIS — M7502 Adhesive capsulitis of left shoulder: Secondary | ICD-10-CM | POA: Diagnosis not present

## 2021-04-08 DIAGNOSIS — M2550 Pain in unspecified joint: Secondary | ICD-10-CM | POA: Diagnosis not present

## 2021-04-08 DIAGNOSIS — M79672 Pain in left foot: Secondary | ICD-10-CM | POA: Diagnosis not present

## 2021-04-08 DIAGNOSIS — M549 Dorsalgia, unspecified: Secondary | ICD-10-CM | POA: Diagnosis not present

## 2021-04-08 DIAGNOSIS — M79643 Pain in unspecified hand: Secondary | ICD-10-CM | POA: Diagnosis not present

## 2021-04-09 DIAGNOSIS — N9412 Deep dyspareunia: Secondary | ICD-10-CM | POA: Diagnosis not present

## 2021-04-28 DIAGNOSIS — D471 Chronic myeloproliferative disease: Secondary | ICD-10-CM | POA: Diagnosis not present

## 2021-05-02 DIAGNOSIS — M7502 Adhesive capsulitis of left shoulder: Secondary | ICD-10-CM | POA: Diagnosis not present

## 2021-05-02 DIAGNOSIS — M7532 Calcific tendinitis of left shoulder: Secondary | ICD-10-CM | POA: Diagnosis not present

## 2021-05-02 DIAGNOSIS — M25512 Pain in left shoulder: Secondary | ICD-10-CM | POA: Diagnosis not present

## 2021-05-05 DIAGNOSIS — D471 Chronic myeloproliferative disease: Secondary | ICD-10-CM | POA: Diagnosis not present

## 2021-05-12 DIAGNOSIS — D474 Osteomyelofibrosis: Principal | ICD-10-CM

## 2021-05-12 DIAGNOSIS — D473 Essential (hemorrhagic) thrombocythemia: Principal | ICD-10-CM

## 2021-05-14 DIAGNOSIS — M549 Dorsalgia, unspecified: Secondary | ICD-10-CM | POA: Diagnosis not present

## 2021-05-14 DIAGNOSIS — M79643 Pain in unspecified hand: Secondary | ICD-10-CM | POA: Diagnosis not present

## 2021-05-14 DIAGNOSIS — M2550 Pain in unspecified joint: Secondary | ICD-10-CM | POA: Diagnosis not present

## 2021-05-14 DIAGNOSIS — D7581 Myelofibrosis: Secondary | ICD-10-CM | POA: Diagnosis not present

## 2021-05-14 DIAGNOSIS — M25512 Pain in left shoulder: Secondary | ICD-10-CM | POA: Diagnosis not present

## 2021-05-14 DIAGNOSIS — M7502 Adhesive capsulitis of left shoulder: Secondary | ICD-10-CM | POA: Diagnosis not present

## 2021-05-15 DIAGNOSIS — M25512 Pain in left shoulder: Secondary | ICD-10-CM | POA: Diagnosis not present

## 2021-05-15 DIAGNOSIS — R293 Abnormal posture: Secondary | ICD-10-CM | POA: Diagnosis not present

## 2021-05-20 DIAGNOSIS — R293 Abnormal posture: Secondary | ICD-10-CM | POA: Diagnosis not present

## 2021-05-20 DIAGNOSIS — M25512 Pain in left shoulder: Secondary | ICD-10-CM | POA: Diagnosis not present

## 2021-05-26 ENCOUNTER — Ambulatory Visit
Admit: 2021-05-26 | Discharge: 2021-05-26 | Payer: PRIVATE HEALTH INSURANCE | Attending: Internal Medicine | Primary: Internal Medicine

## 2021-05-26 ENCOUNTER — Other Ambulatory Visit: Admit: 2021-05-26 | Discharge: 2021-05-26 | Payer: PRIVATE HEALTH INSURANCE

## 2021-05-26 ENCOUNTER — Ambulatory Visit
Admit: 2021-05-26 | Discharge: 2021-05-26 | Payer: PRIVATE HEALTH INSURANCE | Attending: Adult Health | Primary: Adult Health

## 2021-05-26 DIAGNOSIS — D474 Osteomyelofibrosis: Principal | ICD-10-CM

## 2021-05-26 DIAGNOSIS — D473 Essential (hemorrhagic) thrombocythemia: Principal | ICD-10-CM

## 2021-05-26 DIAGNOSIS — Z6826 Body mass index (BMI) 26.0-26.9, adult: Secondary | ICD-10-CM | POA: Diagnosis not present

## 2021-05-27 DIAGNOSIS — R293 Abnormal posture: Secondary | ICD-10-CM | POA: Diagnosis not present

## 2021-05-27 DIAGNOSIS — M25512 Pain in left shoulder: Secondary | ICD-10-CM | POA: Diagnosis not present

## 2021-05-29 DIAGNOSIS — R293 Abnormal posture: Secondary | ICD-10-CM | POA: Diagnosis not present

## 2021-05-29 DIAGNOSIS — M25512 Pain in left shoulder: Secondary | ICD-10-CM | POA: Diagnosis not present

## 2021-06-06 DIAGNOSIS — R293 Abnormal posture: Secondary | ICD-10-CM | POA: Diagnosis not present

## 2021-06-06 DIAGNOSIS — M25512 Pain in left shoulder: Secondary | ICD-10-CM | POA: Diagnosis not present

## 2021-06-18 ENCOUNTER — Encounter: Admit: 2021-06-18 | Discharge: 2021-06-18 | Payer: PRIVATE HEALTH INSURANCE

## 2021-06-18 ENCOUNTER — Ambulatory Visit: Admit: 2021-06-18 | Discharge: 2021-06-18 | Payer: PRIVATE HEALTH INSURANCE

## 2021-06-18 ENCOUNTER — Institutional Professional Consult (permissible substitution): Admit: 2021-06-18 | Discharge: 2021-06-18 | Payer: PRIVATE HEALTH INSURANCE

## 2021-06-18 ENCOUNTER — Other Ambulatory Visit: Admit: 2021-06-18 | Discharge: 2021-06-18 | Payer: PRIVATE HEALTH INSURANCE

## 2021-06-18 DIAGNOSIS — Z7682 Awaiting organ transplant status: Principal | ICD-10-CM

## 2021-06-18 DIAGNOSIS — D473 Essential (hemorrhagic) thrombocythemia: Principal | ICD-10-CM

## 2021-06-18 DIAGNOSIS — D474 Osteomyelofibrosis: Principal | ICD-10-CM

## 2021-06-18 DIAGNOSIS — G8929 Other chronic pain: Secondary | ICD-10-CM | POA: Diagnosis not present

## 2021-06-18 DIAGNOSIS — Z6826 Body mass index (BMI) 26.0-26.9, adult: Secondary | ICD-10-CM | POA: Diagnosis not present

## 2021-06-18 DIAGNOSIS — M549 Dorsalgia, unspecified: Secondary | ICD-10-CM | POA: Diagnosis not present

## 2021-06-18 DIAGNOSIS — M25511 Pain in right shoulder: Secondary | ICD-10-CM | POA: Diagnosis not present

## 2021-06-18 DIAGNOSIS — Z7982 Long term (current) use of aspirin: Secondary | ICD-10-CM | POA: Diagnosis not present

## 2021-06-18 DIAGNOSIS — R161 Splenomegaly, not elsewhere classified: Secondary | ICD-10-CM | POA: Diagnosis not present

## 2021-06-27 DIAGNOSIS — Z005 Encounter for examination of potential donor of organ and tissue: Secondary | ICD-10-CM | POA: Diagnosis not present

## 2021-06-28 ENCOUNTER — Encounter
Admit: 2021-06-28 | Discharge: 2021-06-28 | Payer: PRIVATE HEALTH INSURANCE | Attending: Internal Medicine | Primary: Internal Medicine

## 2021-06-28 DIAGNOSIS — Z005 Encounter for examination of potential donor of organ and tissue: Secondary | ICD-10-CM | POA: Diagnosis not present

## 2021-06-30 ENCOUNTER — Encounter
Admit: 2021-06-30 | Discharge: 2021-06-30 | Payer: PRIVATE HEALTH INSURANCE | Attending: Internal Medicine | Primary: Internal Medicine

## 2021-06-30 DIAGNOSIS — Z005 Encounter for examination of potential donor of organ and tissue: Secondary | ICD-10-CM | POA: Diagnosis not present

## 2021-07-01 DIAGNOSIS — Z005 Encounter for examination of potential donor of organ and tissue: Principal | ICD-10-CM

## 2021-07-07 ENCOUNTER — Encounter
Admit: 2021-07-07 | Discharge: 2021-07-07 | Payer: PRIVATE HEALTH INSURANCE | Attending: Internal Medicine | Primary: Internal Medicine

## 2021-07-07 DIAGNOSIS — Z005 Encounter for examination of potential donor of organ and tissue: Secondary | ICD-10-CM | POA: Diagnosis not present

## 2021-07-10 DIAGNOSIS — Z005 Encounter for examination of potential donor of organ and tissue: Principal | ICD-10-CM

## 2021-07-11 DIAGNOSIS — Z7682 Awaiting organ transplant status: Principal | ICD-10-CM

## 2021-07-11 DIAGNOSIS — D473 Essential (hemorrhagic) thrombocythemia: Principal | ICD-10-CM

## 2021-07-11 DIAGNOSIS — D474 Osteomyelofibrosis: Principal | ICD-10-CM

## 2021-07-16 ENCOUNTER — Encounter: Admit: 2021-07-16 | Discharge: 2021-07-16 | Payer: PRIVATE HEALTH INSURANCE

## 2021-07-16 DIAGNOSIS — Z7682 Awaiting organ transplant status: Principal | ICD-10-CM

## 2021-07-18 ENCOUNTER — Encounter: Admit: 2021-07-18 | Discharge: 2021-07-18 | Payer: PRIVATE HEALTH INSURANCE

## 2021-07-18 DIAGNOSIS — Z7682 Awaiting organ transplant status: Principal | ICD-10-CM

## 2021-07-22 DIAGNOSIS — D473 Essential (hemorrhagic) thrombocythemia: Principal | ICD-10-CM

## 2021-07-22 DIAGNOSIS — D474 Osteomyelofibrosis: Principal | ICD-10-CM

## 2021-07-29 DIAGNOSIS — D473 Essential (hemorrhagic) thrombocythemia: Principal | ICD-10-CM

## 2021-07-29 DIAGNOSIS — D474 Osteomyelofibrosis: Principal | ICD-10-CM

## 2021-07-29 MED ORDER — RUXOLITINIB 20 MG TABLET
ORAL_TABLET | Freq: Two times a day (BID) | ORAL | 2 refills | 30 days | Status: CP
Start: 2021-07-29 — End: ?

## 2021-07-31 DIAGNOSIS — D471 Chronic myeloproliferative disease: Secondary | ICD-10-CM | POA: Diagnosis not present

## 2021-08-04 DIAGNOSIS — Z803 Family history of malignant neoplasm of breast: Secondary | ICD-10-CM | POA: Diagnosis not present

## 2021-08-04 DIAGNOSIS — D7581 Myelofibrosis: Secondary | ICD-10-CM | POA: Diagnosis not present

## 2021-08-04 DIAGNOSIS — D72829 Elevated white blood cell count, unspecified: Secondary | ICD-10-CM | POA: Diagnosis not present

## 2021-08-04 DIAGNOSIS — D649 Anemia, unspecified: Secondary | ICD-10-CM | POA: Diagnosis not present

## 2021-08-12 DIAGNOSIS — Z7682 Awaiting organ transplant status: Principal | ICD-10-CM

## 2021-08-18 DIAGNOSIS — D473 Essential (hemorrhagic) thrombocythemia: Principal | ICD-10-CM

## 2021-08-18 DIAGNOSIS — D474 Osteomyelofibrosis: Principal | ICD-10-CM

## 2021-08-20 DIAGNOSIS — D485 Neoplasm of uncertain behavior of skin: Secondary | ICD-10-CM | POA: Diagnosis not present

## 2021-08-20 DIAGNOSIS — D2271 Melanocytic nevi of right lower limb, including hip: Secondary | ICD-10-CM | POA: Diagnosis not present

## 2021-08-20 DIAGNOSIS — B078 Other viral warts: Secondary | ICD-10-CM | POA: Diagnosis not present

## 2021-08-20 DIAGNOSIS — L570 Actinic keratosis: Secondary | ICD-10-CM | POA: Diagnosis not present

## 2021-08-20 DIAGNOSIS — L821 Other seborrheic keratosis: Secondary | ICD-10-CM | POA: Diagnosis not present

## 2021-08-20 DIAGNOSIS — L719 Rosacea, unspecified: Secondary | ICD-10-CM | POA: Diagnosis not present

## 2021-08-20 DIAGNOSIS — D2272 Melanocytic nevi of left lower limb, including hip: Secondary | ICD-10-CM | POA: Diagnosis not present

## 2021-08-20 DIAGNOSIS — D225 Melanocytic nevi of trunk: Secondary | ICD-10-CM | POA: Diagnosis not present

## 2021-08-20 DIAGNOSIS — C4441 Basal cell carcinoma of skin of scalp and neck: Secondary | ICD-10-CM | POA: Diagnosis not present

## 2021-08-20 DIAGNOSIS — D2262 Melanocytic nevi of left upper limb, including shoulder: Secondary | ICD-10-CM | POA: Diagnosis not present

## 2021-08-20 DIAGNOSIS — L578 Other skin changes due to chronic exposure to nonionizing radiation: Secondary | ICD-10-CM | POA: Diagnosis not present

## 2021-08-20 DIAGNOSIS — Q828 Other specified congenital malformations of skin: Secondary | ICD-10-CM | POA: Diagnosis not present

## 2021-08-20 DIAGNOSIS — L820 Inflamed seborrheic keratosis: Secondary | ICD-10-CM | POA: Diagnosis not present

## 2021-08-26 DIAGNOSIS — M25551 Pain in right hip: Secondary | ICD-10-CM | POA: Diagnosis not present

## 2021-09-01 DIAGNOSIS — D7581 Myelofibrosis: Secondary | ICD-10-CM | POA: Diagnosis not present

## 2021-09-01 DIAGNOSIS — M549 Dorsalgia, unspecified: Secondary | ICD-10-CM | POA: Diagnosis not present

## 2021-09-01 DIAGNOSIS — R5383 Other fatigue: Secondary | ICD-10-CM | POA: Diagnosis not present

## 2021-09-08 DIAGNOSIS — D474 Osteomyelofibrosis: Principal | ICD-10-CM

## 2021-09-08 DIAGNOSIS — Z7682 Awaiting organ transplant status: Principal | ICD-10-CM

## 2021-09-08 DIAGNOSIS — D473 Essential (hemorrhagic) thrombocythemia: Principal | ICD-10-CM

## 2021-09-10 DIAGNOSIS — M25551 Pain in right hip: Secondary | ICD-10-CM | POA: Diagnosis not present

## 2021-09-12 DIAGNOSIS — G43909 Migraine, unspecified, not intractable, without status migrainosus: Secondary | ICD-10-CM | POA: Diagnosis not present

## 2021-09-12 DIAGNOSIS — D473 Essential (hemorrhagic) thrombocythemia: Secondary | ICD-10-CM | POA: Diagnosis not present

## 2021-09-12 DIAGNOSIS — R69 Illness, unspecified: Secondary | ICD-10-CM | POA: Diagnosis not present

## 2021-09-12 DIAGNOSIS — D7581 Myelofibrosis: Secondary | ICD-10-CM | POA: Diagnosis not present

## 2021-09-15 DIAGNOSIS — D474 Osteomyelofibrosis: Principal | ICD-10-CM

## 2021-09-15 DIAGNOSIS — D473 Essential (hemorrhagic) thrombocythemia: Principal | ICD-10-CM

## 2021-09-23 DIAGNOSIS — R197 Diarrhea, unspecified: Principal | ICD-10-CM

## 2021-09-23 DIAGNOSIS — D474 Osteomyelofibrosis: Secondary | ICD-10-CM | POA: Diagnosis not present

## 2021-09-23 DIAGNOSIS — D473 Essential (hemorrhagic) thrombocythemia: Secondary | ICD-10-CM | POA: Diagnosis not present

## 2021-09-25 DIAGNOSIS — A020 Salmonella enteritis: Principal | ICD-10-CM

## 2021-09-25 MED ORDER — LEVOFLOXACIN 500 MG TABLET
ORAL_TABLET | ORAL | 0 refills | 14.00000 days | Status: CP
Start: 2021-09-25 — End: 2021-10-09

## 2021-09-26 DIAGNOSIS — Z7682 Awaiting organ transplant status: Principal | ICD-10-CM

## 2021-10-03 DIAGNOSIS — Z6827 Body mass index (BMI) 27.0-27.9, adult: Secondary | ICD-10-CM | POA: Diagnosis not present

## 2021-10-03 DIAGNOSIS — Z01419 Encounter for gynecological examination (general) (routine) without abnormal findings: Secondary | ICD-10-CM | POA: Diagnosis not present

## 2021-10-03 DIAGNOSIS — Z1239 Encounter for other screening for malignant neoplasm of breast: Secondary | ICD-10-CM | POA: Diagnosis not present

## 2021-10-03 DIAGNOSIS — D7581 Myelofibrosis: Secondary | ICD-10-CM | POA: Diagnosis not present

## 2021-10-03 DIAGNOSIS — Z124 Encounter for screening for malignant neoplasm of cervix: Secondary | ICD-10-CM | POA: Diagnosis not present

## 2021-10-03 DIAGNOSIS — Z1211 Encounter for screening for malignant neoplasm of colon: Secondary | ICD-10-CM | POA: Diagnosis not present

## 2021-10-08 DIAGNOSIS — L570 Actinic keratosis: Secondary | ICD-10-CM | POA: Diagnosis not present

## 2021-10-08 DIAGNOSIS — B078 Other viral warts: Secondary | ICD-10-CM | POA: Diagnosis not present

## 2021-10-08 DIAGNOSIS — L720 Epidermal cyst: Secondary | ICD-10-CM | POA: Diagnosis not present

## 2021-10-09 DIAGNOSIS — Z1211 Encounter for screening for malignant neoplasm of colon: Secondary | ICD-10-CM | POA: Diagnosis not present

## 2021-10-10 DIAGNOSIS — M25512 Pain in left shoulder: Principal | ICD-10-CM

## 2021-10-10 DIAGNOSIS — G8929 Other chronic pain: Principal | ICD-10-CM

## 2021-10-10 MED ORDER — GABAPENTIN 600 MG TABLET
ORAL_TABLET | Freq: Three times a day (TID) | ORAL | 2 refills | 30 days | Status: CP
Start: 2021-10-10 — End: 2022-01-08

## 2021-10-10 MED ORDER — PROCHLORPERAZINE MALEATE 5 MG TABLET
ORAL_TABLET | Freq: Three times a day (TID) | ORAL | 6 refills | 10 days | Status: CP | PRN
Start: 2021-10-10 — End: ?

## 2021-10-13 DIAGNOSIS — D474 Osteomyelofibrosis: Principal | ICD-10-CM

## 2021-10-13 DIAGNOSIS — D473 Essential (hemorrhagic) thrombocythemia: Principal | ICD-10-CM

## 2021-10-15 LAB — COLOGUARD: COLOGUARD: NEGATIVE

## 2021-10-15 LAB — EXTERNAL GENERIC LAB PROCEDURE: COLOGUARD: NEGATIVE

## 2021-10-23 DIAGNOSIS — M25521 Pain in right elbow: Secondary | ICD-10-CM | POA: Diagnosis not present

## 2021-10-23 DIAGNOSIS — M7711 Lateral epicondylitis, right elbow: Secondary | ICD-10-CM | POA: Diagnosis not present

## 2021-10-24 DIAGNOSIS — D485 Neoplasm of uncertain behavior of skin: Secondary | ICD-10-CM | POA: Diagnosis not present

## 2021-10-24 DIAGNOSIS — C4441 Basal cell carcinoma of skin of scalp and neck: Secondary | ICD-10-CM | POA: Diagnosis not present

## 2021-10-24 DIAGNOSIS — D489 Neoplasm of uncertain behavior, unspecified: Secondary | ICD-10-CM | POA: Diagnosis not present

## 2021-10-28 ENCOUNTER — Ambulatory Visit: Admit: 2021-10-28 | Discharge: 2021-10-29 | Payer: PRIVATE HEALTH INSURANCE

## 2021-10-28 ENCOUNTER — Telehealth: Admit: 2021-10-28 | Discharge: 2021-10-29 | Payer: PRIVATE HEALTH INSURANCE

## 2021-10-28 DIAGNOSIS — D471 Chronic myeloproliferative disease: Secondary | ICD-10-CM | POA: Diagnosis not present

## 2021-10-28 DIAGNOSIS — Z7682 Awaiting organ transplant status: Secondary | ICD-10-CM | POA: Diagnosis not present

## 2021-11-06 ENCOUNTER — Other Ambulatory Visit: Payer: Self-pay | Admitting: Obstetrics and Gynecology

## 2021-11-06 DIAGNOSIS — Z1231 Encounter for screening mammogram for malignant neoplasm of breast: Secondary | ICD-10-CM

## 2021-11-10 DIAGNOSIS — D473 Essential (hemorrhagic) thrombocythemia: Principal | ICD-10-CM

## 2021-11-10 DIAGNOSIS — D474 Osteomyelofibrosis: Principal | ICD-10-CM

## 2021-11-11 DIAGNOSIS — R059 Cough, unspecified: Secondary | ICD-10-CM | POA: Diagnosis not present

## 2021-11-11 DIAGNOSIS — R0602 Shortness of breath: Secondary | ICD-10-CM | POA: Diagnosis not present

## 2021-11-17 ENCOUNTER — Ambulatory Visit: Admit: 2021-11-17 | Discharge: 2021-11-18 | Payer: PRIVATE HEALTH INSURANCE

## 2021-11-17 ENCOUNTER — Ambulatory Visit
Admit: 2021-11-17 | Discharge: 2021-11-18 | Payer: PRIVATE HEALTH INSURANCE | Attending: Internal Medicine | Primary: Internal Medicine

## 2021-11-17 ENCOUNTER — Encounter: Admit: 2021-11-17 | Discharge: 2021-11-18 | Payer: PRIVATE HEALTH INSURANCE

## 2021-11-17 ENCOUNTER — Other Ambulatory Visit: Admit: 2021-11-17 | Discharge: 2021-11-18 | Payer: PRIVATE HEALTH INSURANCE

## 2021-11-17 DIAGNOSIS — D473 Essential (hemorrhagic) thrombocythemia: Principal | ICD-10-CM

## 2021-11-17 DIAGNOSIS — D474 Osteomyelofibrosis: Principal | ICD-10-CM

## 2021-11-17 DIAGNOSIS — Z7682 Awaiting organ transplant status: Principal | ICD-10-CM

## 2021-11-17 DIAGNOSIS — Z803 Family history of malignant neoplasm of breast: Secondary | ICD-10-CM | POA: Diagnosis not present

## 2021-11-17 DIAGNOSIS — Z7982 Long term (current) use of aspirin: Secondary | ICD-10-CM | POA: Diagnosis not present

## 2021-11-17 DIAGNOSIS — D649 Anemia, unspecified: Secondary | ICD-10-CM | POA: Diagnosis not present

## 2021-11-17 DIAGNOSIS — R161 Splenomegaly, not elsewhere classified: Secondary | ICD-10-CM | POA: Diagnosis not present

## 2021-11-17 DIAGNOSIS — I071 Rheumatic tricuspid insufficiency: Secondary | ICD-10-CM | POA: Diagnosis not present

## 2021-11-17 DIAGNOSIS — M545 Low back pain, unspecified: Secondary | ICD-10-CM | POA: Diagnosis not present

## 2021-11-17 DIAGNOSIS — K219 Gastro-esophageal reflux disease without esophagitis: Secondary | ICD-10-CM | POA: Diagnosis not present

## 2021-11-17 DIAGNOSIS — R69 Illness, unspecified: Secondary | ICD-10-CM | POA: Diagnosis not present

## 2021-11-21 DIAGNOSIS — Z7682 Awaiting organ transplant status: Principal | ICD-10-CM

## 2021-11-24 ENCOUNTER — Other Ambulatory Visit: Admit: 2021-11-24 | Discharge: 2021-11-25 | Payer: PRIVATE HEALTH INSURANCE

## 2021-11-24 ENCOUNTER — Ambulatory Visit: Admit: 2021-11-24 | Discharge: 2021-11-25 | Payer: PRIVATE HEALTH INSURANCE

## 2021-11-24 ENCOUNTER — Encounter: Admit: 2021-11-24 | Discharge: 2021-11-25 | Payer: PRIVATE HEALTH INSURANCE

## 2021-11-24 ENCOUNTER — Institutional Professional Consult (permissible substitution): Admit: 2021-11-24 | Discharge: 2021-11-25 | Payer: PRIVATE HEALTH INSURANCE

## 2021-11-24 DIAGNOSIS — Z7682 Awaiting organ transplant status: Principal | ICD-10-CM

## 2021-11-24 DIAGNOSIS — D474 Osteomyelofibrosis: Principal | ICD-10-CM

## 2021-11-24 DIAGNOSIS — D473 Essential (hemorrhagic) thrombocythemia: Principal | ICD-10-CM

## 2021-11-24 DIAGNOSIS — D7581 Myelofibrosis: Secondary | ICD-10-CM | POA: Diagnosis not present

## 2021-11-24 DIAGNOSIS — Z9484 Stem cells transplant status: Secondary | ICD-10-CM | POA: Diagnosis not present

## 2021-12-01 ENCOUNTER — Ambulatory Visit: Admit: 2021-12-01 | Discharge: 2021-12-02 | Payer: PRIVATE HEALTH INSURANCE | Attending: Family | Primary: Family

## 2021-12-01 DIAGNOSIS — D473 Essential (hemorrhagic) thrombocythemia: Principal | ICD-10-CM

## 2021-12-01 DIAGNOSIS — D474 Osteomyelofibrosis: Principal | ICD-10-CM

## 2021-12-01 DIAGNOSIS — Z7682 Awaiting organ transplant status: Principal | ICD-10-CM

## 2021-12-01 DIAGNOSIS — N898 Other specified noninflammatory disorders of vagina: Secondary | ICD-10-CM | POA: Diagnosis not present

## 2021-12-01 DIAGNOSIS — N762 Acute vulvitis: Secondary | ICD-10-CM | POA: Diagnosis not present

## 2021-12-08 DIAGNOSIS — D474 Osteomyelofibrosis: Principal | ICD-10-CM

## 2021-12-08 DIAGNOSIS — D473 Essential (hemorrhagic) thrombocythemia: Principal | ICD-10-CM

## 2021-12-09 DIAGNOSIS — C4441 Basal cell carcinoma of skin of scalp and neck: Secondary | ICD-10-CM | POA: Diagnosis not present

## 2021-12-09 DIAGNOSIS — L988 Other specified disorders of the skin and subcutaneous tissue: Secondary | ICD-10-CM | POA: Diagnosis not present

## 2021-12-17 ENCOUNTER — Ambulatory Visit
Admission: RE | Admit: 2021-12-17 | Discharge: 2021-12-17 | Disposition: A | Discharge status: Home / Self Care | Payer: 59 | Source: Ambulatory Visit | Attending: Obstetrics and Gynecology | Admitting: Obstetrics and Gynecology

## 2021-12-17 DIAGNOSIS — Z1231 Encounter for screening mammogram for malignant neoplasm of breast: Secondary | ICD-10-CM

## 2021-12-18 ENCOUNTER — Other Ambulatory Visit: Payer: Self-pay | Admitting: Obstetrics and Gynecology

## 2021-12-18 DIAGNOSIS — R928 Other abnormal and inconclusive findings on diagnostic imaging of breast: Secondary | ICD-10-CM

## 2021-12-19 ENCOUNTER — Ambulatory Visit
Admission: RE | Admit: 2021-12-19 | Discharge: 2021-12-19 | Disposition: A | Discharge status: Home / Self Care | Payer: 59 | Source: Ambulatory Visit | Attending: Obstetrics and Gynecology | Admitting: Obstetrics and Gynecology

## 2021-12-19 DIAGNOSIS — R928 Other abnormal and inconclusive findings on diagnostic imaging of breast: Secondary | ICD-10-CM

## 2021-12-19 DIAGNOSIS — N6012 Diffuse cystic mastopathy of left breast: Secondary | ICD-10-CM | POA: Diagnosis not present

## 2021-12-19 DIAGNOSIS — R922 Inconclusive mammogram: Secondary | ICD-10-CM | POA: Diagnosis not present

## 2021-12-23 ENCOUNTER — Telehealth: Admit: 2021-12-23 | Discharge: 2021-12-24 | Payer: PRIVATE HEALTH INSURANCE | Attending: Clinical | Primary: Clinical

## 2021-12-23 DIAGNOSIS — G8929 Other chronic pain: Principal | ICD-10-CM

## 2021-12-23 DIAGNOSIS — M25512 Pain in left shoulder: Principal | ICD-10-CM

## 2021-12-23 DIAGNOSIS — Z7682 Awaiting organ transplant status: Principal | ICD-10-CM

## 2021-12-23 MED ORDER — GABAPENTIN 600 MG TABLET
ORAL_TABLET | Freq: Three times a day (TID) | ORAL | 2 refills | 30 days | Status: CP
Start: 2021-12-23 — End: 2022-03-23

## 2021-12-31 ENCOUNTER — Ambulatory Visit: Payer: 59

## 2021-12-31 ENCOUNTER — Other Ambulatory Visit: Payer: 59

## 2022-01-05 DIAGNOSIS — D474 Osteomyelofibrosis: Principal | ICD-10-CM

## 2022-01-05 DIAGNOSIS — D473 Essential (hemorrhagic) thrombocythemia: Principal | ICD-10-CM

## 2022-01-06 ENCOUNTER — Institutional Professional Consult (permissible substitution): Admit: 2022-01-06 | Discharge: 2022-01-06 | Payer: PRIVATE HEALTH INSURANCE

## 2022-01-06 ENCOUNTER — Ambulatory Visit: Admit: 2022-01-06 | Discharge: 2022-01-06 | Payer: PRIVATE HEALTH INSURANCE

## 2022-01-06 DIAGNOSIS — Z7682 Awaiting organ transplant status: Secondary | ICD-10-CM | POA: Diagnosis not present

## 2022-01-06 DIAGNOSIS — D471 Chronic myeloproliferative disease: Secondary | ICD-10-CM | POA: Diagnosis not present

## 2022-01-22 ENCOUNTER — Telehealth: Admit: 2022-01-22 | Discharge: 2022-01-23 | Payer: PRIVATE HEALTH INSURANCE | Attending: Clinical | Primary: Clinical

## 2022-01-22 DIAGNOSIS — D473 Essential (hemorrhagic) thrombocythemia: Principal | ICD-10-CM

## 2022-01-22 DIAGNOSIS — D474 Osteomyelofibrosis: Principal | ICD-10-CM

## 2022-01-22 DIAGNOSIS — Z7682 Awaiting organ transplant status: Principal | ICD-10-CM

## 2022-01-23 DIAGNOSIS — Z005 Encounter for examination of potential donor of organ and tissue: Secondary | ICD-10-CM | POA: Diagnosis not present

## 2022-01-27 ENCOUNTER — Other Ambulatory Visit: Admit: 2022-01-27 | Discharge: 2022-01-28 | Payer: PRIVATE HEALTH INSURANCE

## 2022-01-27 ENCOUNTER — Ambulatory Visit: Admit: 2022-01-27 | Discharge: 2022-01-28 | Payer: PRIVATE HEALTH INSURANCE

## 2022-01-27 ENCOUNTER — Encounter: Admit: 2022-01-27 | Discharge: 2022-01-28 | Payer: PRIVATE HEALTH INSURANCE

## 2022-01-27 ENCOUNTER — Institutional Professional Consult (permissible substitution): Admit: 2022-01-27 | Discharge: 2022-01-28 | Payer: PRIVATE HEALTH INSURANCE

## 2022-01-27 DIAGNOSIS — Z7682 Awaiting organ transplant status: Principal | ICD-10-CM

## 2022-01-27 DIAGNOSIS — D474 Osteomyelofibrosis: Principal | ICD-10-CM

## 2022-01-27 DIAGNOSIS — D473 Essential (hemorrhagic) thrombocythemia: Principal | ICD-10-CM

## 2022-01-27 DIAGNOSIS — D471 Chronic myeloproliferative disease: Secondary | ICD-10-CM | POA: Diagnosis not present

## 2022-01-28 ENCOUNTER — Ambulatory Visit: Admit: 2022-01-28 | Discharge: 2022-01-29 | Payer: PRIVATE HEALTH INSURANCE

## 2022-01-28 DIAGNOSIS — D474 Osteomyelofibrosis: Principal | ICD-10-CM

## 2022-01-28 DIAGNOSIS — D473 Essential (hemorrhagic) thrombocythemia: Principal | ICD-10-CM

## 2022-01-28 DIAGNOSIS — Z7682 Awaiting organ transplant status: Principal | ICD-10-CM

## 2022-01-28 DIAGNOSIS — Z01818 Encounter for other preprocedural examination: Secondary | ICD-10-CM | POA: Diagnosis not present

## 2022-01-28 DIAGNOSIS — Z9481 Bone marrow transplant status: Secondary | ICD-10-CM | POA: Diagnosis not present

## 2022-01-29 DIAGNOSIS — Z7682 Awaiting organ transplant status: Secondary | ICD-10-CM | POA: Diagnosis not present

## 2022-01-29 DIAGNOSIS — D473 Essential (hemorrhagic) thrombocythemia: Secondary | ICD-10-CM | POA: Diagnosis not present

## 2022-01-29 DIAGNOSIS — D474 Osteomyelofibrosis: Secondary | ICD-10-CM | POA: Diagnosis not present

## 2022-01-30 DIAGNOSIS — Z7682 Awaiting organ transplant status: Principal | ICD-10-CM

## 2022-01-30 DIAGNOSIS — D473 Essential (hemorrhagic) thrombocythemia: Principal | ICD-10-CM

## 2022-01-30 DIAGNOSIS — D474 Osteomyelofibrosis: Principal | ICD-10-CM

## 2022-02-02 DIAGNOSIS — D474 Osteomyelofibrosis: Principal | ICD-10-CM

## 2022-02-02 DIAGNOSIS — D473 Essential (hemorrhagic) thrombocythemia: Principal | ICD-10-CM

## 2022-02-03 ENCOUNTER — Telehealth: Admit: 2022-02-03 | Discharge: 2022-02-04 | Payer: PRIVATE HEALTH INSURANCE | Attending: Clinical | Primary: Clinical

## 2022-02-04 ENCOUNTER — Ambulatory Visit: Admit: 2022-02-04 | Discharge: 2022-02-04 | Payer: PRIVATE HEALTH INSURANCE

## 2022-02-04 ENCOUNTER — Institutional Professional Consult (permissible substitution): Admit: 2022-02-04 | Discharge: 2022-02-04 | Payer: PRIVATE HEALTH INSURANCE

## 2022-02-04 DIAGNOSIS — D471 Chronic myeloproliferative disease: Secondary | ICD-10-CM | POA: Diagnosis not present

## 2022-02-04 DIAGNOSIS — Z7682 Awaiting organ transplant status: Secondary | ICD-10-CM | POA: Diagnosis not present

## 2022-02-04 MED FILL — LEVETIRACETAM 1,000 MG TABLET: ORAL | 3 days supply | Qty: 6 | Fill #0

## 2022-02-05 ENCOUNTER — Ambulatory Visit: Admit: 2022-02-05 | Discharge: 2022-02-05 | Payer: PRIVATE HEALTH INSURANCE

## 2022-02-05 DIAGNOSIS — D473 Essential (hemorrhagic) thrombocythemia: Principal | ICD-10-CM

## 2022-02-05 DIAGNOSIS — Z7682 Awaiting organ transplant status: Principal | ICD-10-CM

## 2022-02-05 DIAGNOSIS — D474 Osteomyelofibrosis: Principal | ICD-10-CM

## 2022-02-05 DIAGNOSIS — Z452 Encounter for adjustment and management of vascular access device: Secondary | ICD-10-CM | POA: Diagnosis not present

## 2022-02-06 ENCOUNTER — Institutional Professional Consult (permissible substitution): Admit: 2022-02-06 | Discharge: 2022-02-07 | Payer: PRIVATE HEALTH INSURANCE

## 2022-02-06 DIAGNOSIS — D474 Osteomyelofibrosis: Principal | ICD-10-CM

## 2022-02-06 DIAGNOSIS — D473 Essential (hemorrhagic) thrombocythemia: Principal | ICD-10-CM

## 2022-02-06 DIAGNOSIS — Z005 Encounter for examination of potential donor of organ and tissue: Secondary | ICD-10-CM | POA: Diagnosis not present

## 2022-02-08 MED ORDER — LEVETIRACETAM 1,000 MG TABLET
ORAL_TABLET | Freq: Two times a day (BID) | ORAL | 0 refills | 3 days | Status: CP
Start: 2022-02-08 — End: 2022-02-11

## 2022-02-09 ENCOUNTER — Ambulatory Visit
Admit: 2022-02-09 | Discharge: 2022-02-09 | Payer: PRIVATE HEALTH INSURANCE | Attending: Student in an Organized Health Care Education/Training Program | Primary: Student in an Organized Health Care Education/Training Program

## 2022-02-09 ENCOUNTER — Ambulatory Visit
Admit: 2022-02-09 | Discharge: 2022-02-09 | Payer: PRIVATE HEALTH INSURANCE | Attending: Pharmacist Clinician (PhC)/ Clinical Pharmacy Specialist | Primary: Pharmacist Clinician (PhC)/ Clinical Pharmacy Specialist

## 2022-02-09 ENCOUNTER — Institutional Professional Consult (permissible substitution): Admit: 2022-02-09 | Discharge: 2022-02-09 | Payer: PRIVATE HEALTH INSURANCE

## 2022-02-09 ENCOUNTER — Ambulatory Visit
Admit: 2022-02-09 | Discharge: 2022-02-09 | Payer: PRIVATE HEALTH INSURANCE | Attending: Registered" | Primary: Registered"

## 2022-02-09 ENCOUNTER — Ambulatory Visit: Admit: 2022-02-09 | Discharge: 2022-02-09 | Payer: PRIVATE HEALTH INSURANCE

## 2022-02-09 DIAGNOSIS — D473 Essential (hemorrhagic) thrombocythemia: Principal | ICD-10-CM

## 2022-02-09 DIAGNOSIS — D474 Osteomyelofibrosis: Principal | ICD-10-CM

## 2022-02-09 DIAGNOSIS — Z7682 Awaiting organ transplant status: Principal | ICD-10-CM

## 2022-02-09 DIAGNOSIS — Z20822 Contact with and (suspected) exposure to covid-19: Secondary | ICD-10-CM | POA: Diagnosis not present

## 2022-02-09 DIAGNOSIS — Z7982 Long term (current) use of aspirin: Secondary | ICD-10-CM | POA: Diagnosis not present

## 2022-02-09 DIAGNOSIS — Z881 Allergy status to other antibiotic agents status: Secondary | ICD-10-CM | POA: Diagnosis not present

## 2022-02-09 DIAGNOSIS — Z888 Allergy status to other drugs, medicaments and biological substances status: Secondary | ICD-10-CM | POA: Diagnosis not present

## 2022-02-09 DIAGNOSIS — Z885 Allergy status to narcotic agent status: Secondary | ICD-10-CM | POA: Diagnosis not present

## 2022-02-10 DIAGNOSIS — D473 Essential (hemorrhagic) thrombocythemia: Principal | ICD-10-CM

## 2022-02-10 DIAGNOSIS — Z7682 Awaiting organ transplant status: Principal | ICD-10-CM

## 2022-02-11 ENCOUNTER — Ambulatory Visit: Admit: 2022-02-11 | Discharge: 2022-03-13 | Payer: PRIVATE HEALTH INSURANCE

## 2022-02-11 ENCOUNTER — Encounter: Admit: 2022-02-11 | Payer: PRIVATE HEALTH INSURANCE

## 2022-02-11 ENCOUNTER — Encounter: Admit: 2022-02-11 | Payer: PRIVATE HEALTH INSURANCE | Attending: Adult Health

## 2022-02-11 ENCOUNTER — Encounter
Admit: 2022-02-11 | Discharge: 2022-03-13 | Disposition: A | Discharge status: Home / Self Care | Payer: PRIVATE HEALTH INSURANCE | Admitting: Student in an Organized Health Care Education/Training Program

## 2022-02-11 ENCOUNTER — Encounter
Admit: 2022-02-11 | Payer: PRIVATE HEALTH INSURANCE | Attending: Student in an Organized Health Care Education/Training Program

## 2022-02-11 ENCOUNTER — Ambulatory Visit: Admit: 2022-02-11 | Payer: PRIVATE HEALTH INSURANCE

## 2022-02-11 ENCOUNTER — Ambulatory Visit
Admit: 2022-02-11 | Discharge: 2022-03-13 | Disposition: A | Discharge status: Home / Self Care | Payer: PRIVATE HEALTH INSURANCE | Admitting: Student in an Organized Health Care Education/Training Program

## 2022-02-11 ENCOUNTER — Encounter: Admit: 2022-02-11 | Discharge: 2022-03-13 | Payer: PRIVATE HEALTH INSURANCE

## 2022-02-11 DIAGNOSIS — M76812 Anterior tibial syndrome, left leg: Secondary | ICD-10-CM | POA: Diagnosis not present

## 2022-02-11 DIAGNOSIS — D703 Neutropenia due to infection: Secondary | ICD-10-CM | POA: Diagnosis not present

## 2022-02-11 DIAGNOSIS — D696 Thrombocytopenia, unspecified: Secondary | ICD-10-CM | POA: Diagnosis not present

## 2022-02-11 DIAGNOSIS — R0602 Shortness of breath: Secondary | ICD-10-CM | POA: Diagnosis not present

## 2022-02-11 DIAGNOSIS — R21 Rash and other nonspecific skin eruption: Secondary | ICD-10-CM | POA: Diagnosis not present

## 2022-02-11 DIAGNOSIS — H1133 Conjunctival hemorrhage, bilateral: Secondary | ICD-10-CM | POA: Diagnosis not present

## 2022-02-11 DIAGNOSIS — G629 Polyneuropathy, unspecified: Secondary | ICD-10-CM | POA: Diagnosis not present

## 2022-02-11 DIAGNOSIS — R110 Nausea: Secondary | ICD-10-CM | POA: Diagnosis not present

## 2022-02-11 DIAGNOSIS — Z9484 Stem cells transplant status: Secondary | ICD-10-CM | POA: Diagnosis not present

## 2022-02-11 DIAGNOSIS — F419 Anxiety disorder, unspecified: Secondary | ICD-10-CM | POA: Diagnosis not present

## 2022-02-11 DIAGNOSIS — M25512 Pain in left shoulder: Secondary | ICD-10-CM | POA: Diagnosis not present

## 2022-02-11 DIAGNOSIS — R112 Nausea with vomiting, unspecified: Secondary | ICD-10-CM | POA: Diagnosis not present

## 2022-02-11 DIAGNOSIS — R600 Localized edema: Secondary | ICD-10-CM | POA: Diagnosis not present

## 2022-02-11 DIAGNOSIS — R5081 Fever presenting with conditions classified elsewhere: Secondary | ICD-10-CM | POA: Diagnosis not present

## 2022-02-11 DIAGNOSIS — Z9221 Personal history of antineoplastic chemotherapy: Secondary | ICD-10-CM | POA: Diagnosis not present

## 2022-02-11 DIAGNOSIS — C4491 Basal cell carcinoma of skin, unspecified: Secondary | ICD-10-CM | POA: Diagnosis not present

## 2022-02-11 DIAGNOSIS — F329 Major depressive disorder, single episode, unspecified: Secondary | ICD-10-CM | POA: Diagnosis not present

## 2022-02-11 DIAGNOSIS — R233 Spontaneous ecchymoses: Secondary | ICD-10-CM | POA: Diagnosis not present

## 2022-02-11 DIAGNOSIS — Z1152 Encounter for screening for COVID-19: Secondary | ICD-10-CM | POA: Diagnosis not present

## 2022-02-11 DIAGNOSIS — L270 Generalized skin eruption due to drugs and medicaments taken internally: Secondary | ICD-10-CM | POA: Diagnosis not present

## 2022-02-11 DIAGNOSIS — J9811 Atelectasis: Secondary | ICD-10-CM | POA: Diagnosis not present

## 2022-02-11 DIAGNOSIS — D509 Iron deficiency anemia, unspecified: Secondary | ICD-10-CM | POA: Diagnosis not present

## 2022-02-11 DIAGNOSIS — D474 Osteomyelofibrosis: Secondary | ICD-10-CM | POA: Diagnosis not present

## 2022-02-11 DIAGNOSIS — R197 Diarrhea, unspecified: Secondary | ICD-10-CM | POA: Diagnosis not present

## 2022-02-11 DIAGNOSIS — M7989 Other specified soft tissue disorders: Secondary | ICD-10-CM | POA: Diagnosis not present

## 2022-02-11 DIAGNOSIS — R059 Cough, unspecified: Secondary | ICD-10-CM | POA: Diagnosis not present

## 2022-02-11 DIAGNOSIS — R0902 Hypoxemia: Secondary | ICD-10-CM | POA: Diagnosis not present

## 2022-02-11 DIAGNOSIS — E8770 Fluid overload, unspecified: Secondary | ICD-10-CM | POA: Diagnosis not present

## 2022-02-11 DIAGNOSIS — D61818 Other pancytopenia: Secondary | ICD-10-CM | POA: Diagnosis not present

## 2022-02-11 DIAGNOSIS — K1230 Oral mucositis (ulcerative), unspecified: Secondary | ICD-10-CM | POA: Diagnosis not present

## 2022-02-11 DIAGNOSIS — D473 Essential (hemorrhagic) thrombocythemia: Secondary | ICD-10-CM | POA: Diagnosis not present

## 2022-02-11 DIAGNOSIS — D61810 Antineoplastic chemotherapy induced pancytopenia: Secondary | ICD-10-CM | POA: Diagnosis not present

## 2022-02-11 DIAGNOSIS — D709 Neutropenia, unspecified: Secondary | ICD-10-CM | POA: Diagnosis not present

## 2022-02-11 DIAGNOSIS — N7681 Mucositis (ulcerative) of vagina and vulva: Secondary | ICD-10-CM | POA: Diagnosis not present

## 2022-02-11 DIAGNOSIS — Z7682 Awaiting organ transplant status: Secondary | ICD-10-CM | POA: Diagnosis not present

## 2022-02-11 DIAGNOSIS — H1130 Conjunctival hemorrhage, unspecified eye: Secondary | ICD-10-CM | POA: Diagnosis not present

## 2022-02-11 DIAGNOSIS — D7581 Myelofibrosis: Secondary | ICD-10-CM | POA: Diagnosis not present

## 2022-02-11 MED ORDER — LETERMOVIR 480 MG TABLET
ORAL_TABLET | ORAL | 0 refills | 0 days
Start: 2022-02-11 — End: 2022-02-11

## 2022-02-11 MED ORDER — TACROLIMUS 0.5 MG CAPSULE, IMMEDIATE-RELEASE
ORAL_CAPSULE | 0 refills | 0 days
Start: 2022-02-11 — End: 2022-02-11

## 2022-02-16 MED ORDER — TACROLIMUS 1 MG CAPSULE, IMMEDIATE-RELEASE
ORAL_CAPSULE | Freq: Two times a day (BID) | ORAL | 5 refills | 30 days | Status: CP
Start: 2022-02-16 — End: ?

## 2022-03-02 DIAGNOSIS — D474 Osteomyelofibrosis: Principal | ICD-10-CM

## 2022-03-02 DIAGNOSIS — D473 Essential (hemorrhagic) thrombocythemia: Principal | ICD-10-CM

## 2022-03-09 DIAGNOSIS — Z9484 Stem cells transplant status: Principal | ICD-10-CM

## 2022-03-09 MED ORDER — VALACYCLOVIR 500 MG TABLET
ORAL_TABLET | Freq: Two times a day (BID) | ORAL | 11 refills | 30 days
Start: 2022-03-09 — End: ?

## 2022-03-09 MED ORDER — GABAPENTIN 300 MG CAPSULE
ORAL_CAPSULE | Freq: Every evening | ORAL | 1 refills | 30 days
Start: 2022-03-09 — End: ?

## 2022-03-10 MED ORDER — LETERMOVIR 480 MG TABLET
ORAL_TABLET | Freq: Every day | ORAL | 6 refills | 28 days
Start: 2022-03-10 — End: ?

## 2022-03-11 MED ORDER — VALACYCLOVIR 500 MG TABLET
ORAL_TABLET | Freq: Two times a day (BID) | ORAL | 11 refills | 30 days
Start: 2022-03-11 — End: ?

## 2022-03-11 MED ORDER — LIDOCAINE 4 % TOPICAL PATCH
MEDICATED_PATCH | Freq: Every day | TRANSDERMAL | 1 refills | 30 days | PRN
Start: 2022-03-11 — End: ?

## 2022-03-11 MED ORDER — LOPERAMIDE 2 MG CAPSULE
ORAL_CAPSULE | ORAL | 0 refills | 4 days | PRN
Start: 2022-03-11 — End: 2022-04-10

## 2022-03-11 MED ORDER — URSODIOL 300 MG CAPSULE
ORAL_CAPSULE | Freq: Three times a day (TID) | ORAL | 0 refills | 30 days
Start: 2022-03-11 — End: 2022-04-10

## 2022-03-11 MED ORDER — LETERMOVIR 480 MG TABLET
ORAL_TABLET | Freq: Every day | ORAL | 6 refills | 28 days
Start: 2022-03-11 — End: ?

## 2022-03-11 MED ORDER — GABAPENTIN 300 MG CAPSULE
ORAL_CAPSULE | Freq: Every evening | ORAL | 1 refills | 30 days
Start: 2022-03-11 — End: ?

## 2022-03-12 DIAGNOSIS — Z9484 Stem cells transplant status: Secondary | ICD-10-CM | POA: Diagnosis not present

## 2022-03-12 MED ORDER — PROCHLORPERAZINE MALEATE 10 MG TABLET
ORAL_TABLET | Freq: Three times a day (TID) | ORAL | 0 refills | 10 days
Start: 2022-03-12 — End: 2022-03-19

## 2022-03-12 MED ORDER — VALACYCLOVIR 500 MG TABLET
ORAL_TABLET | Freq: Two times a day (BID) | ORAL | 11 refills | 30 days
Start: 2022-03-12 — End: ?

## 2022-03-12 MED ORDER — LORAZEPAM 0.5 MG TABLET
ORAL_TABLET | ORAL | 0 refills | 4 days | PRN
Start: 2022-03-12 — End: ?

## 2022-03-12 MED ORDER — URSODIOL 300 MG CAPSULE
ORAL_CAPSULE | Freq: Three times a day (TID) | ORAL | 0 refills | 30 days
Start: 2022-03-12 — End: 2022-04-11

## 2022-03-12 MED ORDER — LETERMOVIR 480 MG TABLET
ORAL_TABLET | Freq: Every day | ORAL | 6 refills | 28 days
Start: 2022-03-12 — End: ?

## 2022-03-12 MED ORDER — DICLOFENAC 1 % TOPICAL GEL
Freq: Four times a day (QID) | TOPICAL | 0 refills | 13 days | PRN
Start: 2022-03-12 — End: 2023-03-12

## 2022-03-12 MED ORDER — OLANZAPINE 10 MG DISINTEGRATING TABLET
ORAL_TABLET | Freq: Every evening | ORAL | 0 refills | 30 days
Start: 2022-03-12 — End: 2022-04-11

## 2022-03-12 MED ORDER — TRAMADOL 50 MG TABLET
ORAL_TABLET | Freq: Four times a day (QID) | ORAL | 0 refills | 5 days | PRN
Start: 2022-03-12 — End: ?

## 2022-03-12 MED ORDER — ONDANSETRON HCL 8 MG TABLET
ORAL_TABLET | Freq: Three times a day (TID) | ORAL | 1 refills | 10 days | PRN
Start: 2022-03-12 — End: ?

## 2022-03-12 MED ORDER — PANTOPRAZOLE 40 MG TABLET,DELAYED RELEASE
ORAL_TABLET | Freq: Every day | ORAL | 0 refills | 30 days
Start: 2022-03-12 — End: 2022-04-11

## 2022-03-12 MED ORDER — FLUCONAZOLE 200 MG TABLET
ORAL_TABLET | Freq: Every day | ORAL | 0 refills | 30 days
Start: 2022-03-12 — End: ?

## 2022-03-12 MED ORDER — LOPERAMIDE 2 MG CAPSULE
ORAL_CAPSULE | ORAL | 0 refills | 4 days | PRN
Start: 2022-03-12 — End: 2022-04-11

## 2022-03-12 MED ORDER — GABAPENTIN 300 MG CAPSULE
ORAL_CAPSULE | Freq: Every evening | ORAL | 1 refills | 30 days
Start: 2022-03-12 — End: ?

## 2022-03-12 MED ORDER — LIDOCAINE 4 % TOPICAL PATCH
MEDICATED_PATCH | Freq: Every day | TRANSDERMAL | 1 refills | 30 days | PRN
Start: 2022-03-12 — End: ?

## 2022-03-13 DIAGNOSIS — D473 Essential (hemorrhagic) thrombocythemia: Principal | ICD-10-CM

## 2022-03-13 DIAGNOSIS — Z9484 Stem cells transplant status: Principal | ICD-10-CM

## 2022-03-13 DIAGNOSIS — D474 Osteomyelofibrosis: Principal | ICD-10-CM

## 2022-03-13 MED ORDER — LETERMOVIR 480 MG TABLET
ORAL_TABLET | Freq: Every day | ORAL | 6 refills | 28 days | Status: CP
Start: 2022-03-13 — End: ?
  Filled 2022-03-13: qty 28, 28d supply, fill #0

## 2022-03-13 MED ORDER — ONDANSETRON HCL 8 MG TABLET
ORAL_TABLET | Freq: Three times a day (TID) | ORAL | 1 refills | 10 days | Status: CP | PRN
Start: 2022-03-13 — End: ?
  Filled 2022-03-13: qty 30, 10d supply, fill #0

## 2022-03-13 MED ORDER — GABAPENTIN 300 MG CAPSULE
ORAL_CAPSULE | Freq: Every evening | ORAL | 3 refills | 30 days | Status: CP
Start: 2022-03-13 — End: ?
  Filled 2022-03-13: qty 60, 30d supply, fill #0

## 2022-03-13 MED ORDER — LIDOCAINE 4 % TOPICAL PATCH
MEDICATED_PATCH | Freq: Every day | TRANSDERMAL | 1 refills | 30 days | Status: CP | PRN
Start: 2022-03-13 — End: ?

## 2022-03-13 MED ORDER — URSODIOL 300 MG CAPSULE
ORAL_CAPSULE | Freq: Three times a day (TID) | ORAL | 2 refills | 30 days | Status: CP
Start: 2022-03-13 — End: ?
  Filled 2022-03-13: qty 90, 30d supply, fill #0

## 2022-03-13 MED ORDER — VALACYCLOVIR 500 MG TABLET
ORAL_TABLET | Freq: Two times a day (BID) | ORAL | 11 refills | 30 days | Status: CP
Start: 2022-03-13 — End: ?
  Filled 2022-03-13: qty 60, 30d supply, fill #0

## 2022-03-13 MED ORDER — OLANZAPINE 10 MG DISINTEGRATING TABLET
ORAL_TABLET | Freq: Every evening | ORAL | 0 refills | 30 days | Status: CP
Start: 2022-03-13 — End: ?
  Filled 2022-03-13: qty 30, 30d supply, fill #0

## 2022-03-13 MED ORDER — PANTOPRAZOLE 40 MG TABLET,DELAYED RELEASE
ORAL_TABLET | Freq: Every day | ORAL | 2 refills | 30 days | Status: CP
Start: 2022-03-13 — End: ?
  Filled 2022-03-13: qty 30, 30d supply, fill #0

## 2022-03-13 MED ORDER — LOPERAMIDE 2 MG CAPSULE
ORAL_CAPSULE | ORAL | 0 refills | 4 days | Status: CP | PRN
Start: 2022-03-13 — End: ?

## 2022-03-13 MED ORDER — PROCHLORPERAZINE MALEATE 10 MG TABLET
ORAL_TABLET | Freq: Three times a day (TID) | ORAL | 2 refills | 10 days | Status: CP
Start: 2022-03-13 — End: ?
  Filled 2022-03-13: qty 30, 10d supply, fill #0

## 2022-03-13 MED ORDER — FLUCONAZOLE 200 MG TABLET
ORAL_TABLET | Freq: Every day | ORAL | 1 refills | 30 days | Status: CP
Start: 2022-03-13 — End: ?
  Filled 2022-03-13: qty 60, 30d supply, fill #0

## 2022-03-13 MED ORDER — TRAMADOL 50 MG TABLET
ORAL_TABLET | Freq: Four times a day (QID) | ORAL | 0 refills | 5 days | Status: CP | PRN
Start: 2022-03-13 — End: ?
  Filled 2022-03-13: qty 20, 5d supply, fill #0

## 2022-03-13 MED ORDER — TACROLIMUS 1 MG CAPSULE, IMMEDIATE-RELEASE
ORAL_CAPSULE | ORAL | 5 refills | 30 days | Status: CP
Start: 2022-03-13 — End: ?

## 2022-03-13 MED ORDER — LORAZEPAM 0.5 MG TABLET
ORAL_TABLET | ORAL | 0 refills | 4 days | Status: CP | PRN
Start: 2022-03-13 — End: ?
  Filled 2022-03-13: qty 20, 4d supply, fill #0

## 2022-03-14 ENCOUNTER — Ambulatory Visit: Admit: 2022-03-14 | Discharge: 2022-03-15 | Payer: PRIVATE HEALTH INSURANCE

## 2022-03-14 DIAGNOSIS — D473 Essential (hemorrhagic) thrombocythemia: Secondary | ICD-10-CM | POA: Diagnosis not present

## 2022-03-14 DIAGNOSIS — T451X5A Adverse effect of antineoplastic and immunosuppressive drugs, initial encounter: Secondary | ICD-10-CM | POA: Diagnosis not present

## 2022-03-14 DIAGNOSIS — D701 Agranulocytosis secondary to cancer chemotherapy: Secondary | ICD-10-CM | POA: Diagnosis not present

## 2022-03-14 DIAGNOSIS — Z9484 Stem cells transplant status: Secondary | ICD-10-CM | POA: Diagnosis not present

## 2022-03-15 ENCOUNTER — Encounter
Admit: 2022-03-15 | Discharge: 2022-03-16 | Payer: PRIVATE HEALTH INSURANCE | Attending: Adult Health | Primary: Adult Health

## 2022-03-15 ENCOUNTER — Ambulatory Visit: Admit: 2022-03-15 | Discharge: 2022-03-16 | Payer: PRIVATE HEALTH INSURANCE

## 2022-03-15 DIAGNOSIS — D474 Osteomyelofibrosis: Secondary | ICD-10-CM | POA: Diagnosis not present

## 2022-03-15 DIAGNOSIS — Z9484 Stem cells transplant status: Secondary | ICD-10-CM | POA: Diagnosis not present

## 2022-03-15 DIAGNOSIS — D473 Essential (hemorrhagic) thrombocythemia: Secondary | ICD-10-CM | POA: Diagnosis not present

## 2022-03-15 DIAGNOSIS — D701 Agranulocytosis secondary to cancer chemotherapy: Secondary | ICD-10-CM | POA: Diagnosis not present

## 2022-03-15 DIAGNOSIS — T451X5A Adverse effect of antineoplastic and immunosuppressive drugs, initial encounter: Secondary | ICD-10-CM | POA: Diagnosis not present

## 2022-03-16 ENCOUNTER — Ambulatory Visit: Admit: 2022-03-16 | Discharge: 2022-03-17 | Payer: PRIVATE HEALTH INSURANCE

## 2022-03-16 ENCOUNTER — Encounter
Admit: 2022-03-16 | Discharge: 2022-03-17 | Payer: PRIVATE HEALTH INSURANCE | Attending: Adult Health | Primary: Adult Health

## 2022-03-16 ENCOUNTER — Ambulatory Visit: Admit: 2022-03-16 | Discharge: 2022-03-17 | Payer: PRIVATE HEALTH INSURANCE | Attending: Oncology | Primary: Oncology

## 2022-03-16 ENCOUNTER — Other Ambulatory Visit: Admit: 2022-03-16 | Discharge: 2022-03-17 | Payer: PRIVATE HEALTH INSURANCE

## 2022-03-16 DIAGNOSIS — Z9484 Stem cells transplant status: Principal | ICD-10-CM

## 2022-03-16 DIAGNOSIS — R112 Nausea with vomiting, unspecified: Principal | ICD-10-CM

## 2022-03-16 DIAGNOSIS — R002 Palpitations: Principal | ICD-10-CM

## 2022-03-16 DIAGNOSIS — Z7682 Awaiting organ transplant status: Principal | ICD-10-CM

## 2022-03-16 DIAGNOSIS — D473 Essential (hemorrhagic) thrombocythemia: Principal | ICD-10-CM

## 2022-03-16 DIAGNOSIS — D474 Osteomyelofibrosis: Principal | ICD-10-CM

## 2022-03-16 DIAGNOSIS — R197 Diarrhea, unspecified: Secondary | ICD-10-CM | POA: Diagnosis not present

## 2022-03-16 DIAGNOSIS — K5900 Constipation, unspecified: Secondary | ICD-10-CM | POA: Diagnosis not present

## 2022-03-16 DIAGNOSIS — R110 Nausea: Secondary | ICD-10-CM | POA: Diagnosis not present

## 2022-03-16 DIAGNOSIS — Z48290 Encounter for aftercare following bone marrow transplant: Secondary | ICD-10-CM | POA: Diagnosis not present

## 2022-03-16 DIAGNOSIS — K649 Unspecified hemorrhoids: Secondary | ICD-10-CM | POA: Diagnosis not present

## 2022-03-16 DIAGNOSIS — K1230 Oral mucositis (ulcerative), unspecified: Secondary | ICD-10-CM | POA: Diagnosis not present

## 2022-03-16 DIAGNOSIS — Z79899 Other long term (current) drug therapy: Secondary | ICD-10-CM | POA: Diagnosis not present

## 2022-03-16 DIAGNOSIS — R600 Localized edema: Secondary | ICD-10-CM | POA: Diagnosis not present

## 2022-03-16 DIAGNOSIS — D7581 Myelofibrosis: Secondary | ICD-10-CM | POA: Diagnosis not present

## 2022-03-16 DIAGNOSIS — Z9221 Personal history of antineoplastic chemotherapy: Secondary | ICD-10-CM | POA: Diagnosis not present

## 2022-03-16 DIAGNOSIS — Z79621 Long term (current) use of calcineurin inhibitor: Secondary | ICD-10-CM | POA: Diagnosis not present

## 2022-03-16 MED ORDER — PROMETHAZINE 12.5 MG TABLET
ORAL_TABLET | Freq: Three times a day (TID) | ORAL | 0 refills | 5 days | Status: CP
Start: 2022-03-16 — End: ?
  Filled 2022-03-16: qty 15, 5d supply, fill #0

## 2022-03-17 DIAGNOSIS — R002 Palpitations: Secondary | ICD-10-CM | POA: Diagnosis not present

## 2022-03-18 ENCOUNTER — Ambulatory Visit: Admit: 2022-03-18 | Discharge: 2022-03-18 | Payer: PRIVATE HEALTH INSURANCE

## 2022-03-18 ENCOUNTER — Other Ambulatory Visit: Admit: 2022-03-18 | Discharge: 2022-03-18 | Payer: PRIVATE HEALTH INSURANCE

## 2022-03-18 ENCOUNTER — Encounter
Admit: 2022-03-18 | Discharge: 2022-03-18 | Payer: PRIVATE HEALTH INSURANCE | Attending: Primary Care | Primary: Primary Care

## 2022-03-18 DIAGNOSIS — D474 Osteomyelofibrosis: Principal | ICD-10-CM

## 2022-03-18 DIAGNOSIS — Z9484 Stem cells transplant status: Principal | ICD-10-CM

## 2022-03-18 DIAGNOSIS — Z9481 Bone marrow transplant status: Principal | ICD-10-CM

## 2022-03-18 DIAGNOSIS — D473 Essential (hemorrhagic) thrombocythemia: Principal | ICD-10-CM

## 2022-03-18 DIAGNOSIS — D7581 Myelofibrosis: Secondary | ICD-10-CM | POA: Diagnosis not present

## 2022-03-18 DIAGNOSIS — R21 Rash and other nonspecific skin eruption: Secondary | ICD-10-CM | POA: Diagnosis not present

## 2022-03-18 DIAGNOSIS — T865 Complications of stem cell transplant: Secondary | ICD-10-CM | POA: Diagnosis not present

## 2022-03-18 DIAGNOSIS — Z79899 Other long term (current) drug therapy: Secondary | ICD-10-CM | POA: Diagnosis not present

## 2022-03-18 DIAGNOSIS — Z885 Allergy status to narcotic agent status: Secondary | ICD-10-CM | POA: Diagnosis not present

## 2022-03-18 DIAGNOSIS — R197 Diarrhea, unspecified: Secondary | ICD-10-CM | POA: Diagnosis not present

## 2022-03-18 DIAGNOSIS — R112 Nausea with vomiting, unspecified: Secondary | ICD-10-CM | POA: Diagnosis not present

## 2022-03-18 DIAGNOSIS — D649 Anemia, unspecified: Secondary | ICD-10-CM | POA: Diagnosis not present

## 2022-03-20 ENCOUNTER — Encounter
Admit: 2022-03-20 | Discharge: 2022-03-20 | Payer: PRIVATE HEALTH INSURANCE | Attending: Primary Care | Primary: Primary Care

## 2022-03-20 ENCOUNTER — Ambulatory Visit: Admit: 2022-03-20 | Discharge: 2022-03-20 | Payer: PRIVATE HEALTH INSURANCE

## 2022-03-20 ENCOUNTER — Ambulatory Visit: Admit: 2022-03-20 | Discharge: 2022-03-20 | Payer: PRIVATE HEALTH INSURANCE | Attending: Family | Primary: Family

## 2022-03-20 ENCOUNTER — Other Ambulatory Visit: Admit: 2022-03-20 | Discharge: 2022-03-20 | Payer: PRIVATE HEALTH INSURANCE

## 2022-03-20 DIAGNOSIS — Z9484 Stem cells transplant status: Principal | ICD-10-CM

## 2022-03-20 DIAGNOSIS — Z7682 Awaiting organ transplant status: Principal | ICD-10-CM

## 2022-03-20 DIAGNOSIS — Z9481 Bone marrow transplant status: Principal | ICD-10-CM

## 2022-03-20 DIAGNOSIS — D474 Osteomyelofibrosis: Principal | ICD-10-CM

## 2022-03-20 DIAGNOSIS — B360 Pityriasis versicolor: Principal | ICD-10-CM

## 2022-03-20 DIAGNOSIS — D473 Essential (hemorrhagic) thrombocythemia: Principal | ICD-10-CM

## 2022-03-20 DIAGNOSIS — R161 Splenomegaly, not elsewhere classified: Secondary | ICD-10-CM | POA: Diagnosis not present

## 2022-03-20 DIAGNOSIS — H1589 Other disorders of sclera: Secondary | ICD-10-CM | POA: Diagnosis not present

## 2022-03-20 DIAGNOSIS — D7581 Myelofibrosis: Secondary | ICD-10-CM | POA: Diagnosis not present

## 2022-03-20 DIAGNOSIS — Z885 Allergy status to narcotic agent status: Secondary | ICD-10-CM | POA: Diagnosis not present

## 2022-03-20 DIAGNOSIS — R21 Rash and other nonspecific skin eruption: Secondary | ICD-10-CM | POA: Diagnosis not present

## 2022-03-20 DIAGNOSIS — M898X9 Other specified disorders of bone, unspecified site: Secondary | ICD-10-CM | POA: Diagnosis not present

## 2022-03-20 DIAGNOSIS — L299 Pruritus, unspecified: Secondary | ICD-10-CM | POA: Diagnosis not present

## 2022-03-20 DIAGNOSIS — R110 Nausea: Secondary | ICD-10-CM | POA: Diagnosis not present

## 2022-03-20 DIAGNOSIS — E611 Iron deficiency: Secondary | ICD-10-CM | POA: Diagnosis not present

## 2022-03-20 DIAGNOSIS — Z48298 Encounter for aftercare following other organ transplant: Secondary | ICD-10-CM | POA: Diagnosis not present

## 2022-03-20 MED ORDER — CYCLOBENZAPRINE 5 MG TABLET
ORAL_TABLET | Freq: Two times a day (BID) | ORAL | 0 refills | 15 days | Status: CP | PRN
Start: 2022-03-20 — End: ?
  Filled 2022-03-20: qty 30, 15d supply, fill #0

## 2022-03-20 MED ORDER — TACROLIMUS 1 MG CAPSULE, IMMEDIATE-RELEASE
ORAL_CAPSULE | Freq: Two times a day (BID) | ORAL | 5 refills | 30 days | Status: CP
Start: 2022-03-20 — End: ?

## 2022-03-20 MED ORDER — NYSTATIN 100,000 UNIT/GRAM TOPICAL CREAM
Freq: Two times a day (BID) | TOPICAL | 0 refills | 0 days | Status: CP
Start: 2022-03-20 — End: 2023-03-20
  Filled 2022-03-20: qty 30, 10d supply, fill #0

## 2022-03-20 MED ORDER — NYSTATIN 100,000 UNIT/GRAM TOPICAL POWDER
0 refills | 0 days | Status: CP
Start: 2022-03-20 — End: 2023-03-20
  Filled 2022-03-20: qty 15, 7d supply, fill #0

## 2022-03-20 MED FILL — KETOCONAZOLE 2 % SHAMPOO: TOPICAL | 30 days supply | Qty: 120 | Fill #0

## 2022-03-21 DIAGNOSIS — D473 Essential (hemorrhagic) thrombocythemia: Secondary | ICD-10-CM | POA: Diagnosis not present

## 2022-03-23 MED ORDER — KETOCONAZOLE 2 % SHAMPOO
TOPICAL | 0 refills | 30 days | Status: CP
Start: 2022-03-23 — End: 2022-04-22

## 2022-03-24 ENCOUNTER — Encounter
Admit: 2022-03-24 | Discharge: 2022-03-25 | Payer: PRIVATE HEALTH INSURANCE | Attending: Primary Care | Primary: Primary Care

## 2022-03-24 ENCOUNTER — Ambulatory Visit: Admit: 2022-03-24 | Discharge: 2022-03-25 | Payer: PRIVATE HEALTH INSURANCE

## 2022-03-24 ENCOUNTER — Other Ambulatory Visit: Admit: 2022-03-24 | Discharge: 2022-03-25 | Payer: PRIVATE HEALTH INSURANCE

## 2022-03-24 DIAGNOSIS — Z7682 Awaiting organ transplant status: Principal | ICD-10-CM

## 2022-03-24 DIAGNOSIS — Z9481 Bone marrow transplant status: Principal | ICD-10-CM

## 2022-03-24 DIAGNOSIS — Z9484 Stem cells transplant status: Principal | ICD-10-CM

## 2022-03-24 DIAGNOSIS — R112 Nausea with vomiting, unspecified: Secondary | ICD-10-CM | POA: Diagnosis not present

## 2022-03-24 DIAGNOSIS — M549 Dorsalgia, unspecified: Secondary | ICD-10-CM | POA: Diagnosis not present

## 2022-03-24 DIAGNOSIS — D649 Anemia, unspecified: Secondary | ICD-10-CM | POA: Diagnosis not present

## 2022-03-24 DIAGNOSIS — D471 Chronic myeloproliferative disease: Secondary | ICD-10-CM | POA: Diagnosis not present

## 2022-03-24 DIAGNOSIS — R161 Splenomegaly, not elsewhere classified: Secondary | ICD-10-CM | POA: Diagnosis not present

## 2022-03-24 DIAGNOSIS — D84822 Immunodeficiency due to external causes: Secondary | ICD-10-CM | POA: Diagnosis not present

## 2022-03-24 DIAGNOSIS — Z5181 Encounter for therapeutic drug level monitoring: Secondary | ICD-10-CM | POA: Diagnosis not present

## 2022-03-24 DIAGNOSIS — D473 Essential (hemorrhagic) thrombocythemia: Secondary | ICD-10-CM | POA: Diagnosis not present

## 2022-03-24 DIAGNOSIS — D7581 Myelofibrosis: Secondary | ICD-10-CM | POA: Diagnosis not present

## 2022-03-24 DIAGNOSIS — E611 Iron deficiency: Secondary | ICD-10-CM | POA: Diagnosis not present

## 2022-03-24 DIAGNOSIS — Z885 Allergy status to narcotic agent status: Secondary | ICD-10-CM | POA: Diagnosis not present

## 2022-03-24 MED ORDER — PROMETHAZINE 12.5 MG TABLET
ORAL_TABLET | Freq: Three times a day (TID) | ORAL | 1 refills | 10 days | Status: CP | PRN
Start: 2022-03-24 — End: ?
  Filled 2022-03-25: qty 30, 10d supply, fill #0

## 2022-03-27 ENCOUNTER — Ambulatory Visit: Admit: 2022-03-27 | Discharge: 2022-03-27 | Payer: PRIVATE HEALTH INSURANCE

## 2022-03-27 ENCOUNTER — Encounter
Admit: 2022-03-27 | Discharge: 2022-03-27 | Payer: PRIVATE HEALTH INSURANCE | Attending: Primary Care | Primary: Primary Care

## 2022-03-27 ENCOUNTER — Encounter: Admit: 2022-03-27 | Discharge: 2022-03-27 | Payer: PRIVATE HEALTH INSURANCE

## 2022-03-27 ENCOUNTER — Other Ambulatory Visit: Admit: 2022-03-27 | Discharge: 2022-03-27 | Payer: PRIVATE HEALTH INSURANCE

## 2022-03-27 DIAGNOSIS — Z9484 Stem cells transplant status: Principal | ICD-10-CM

## 2022-03-27 DIAGNOSIS — Z9481 Bone marrow transplant status: Principal | ICD-10-CM

## 2022-03-27 DIAGNOSIS — D84822 Immunocompromised state associated with stem cell transplant (CMS-HCC): Principal | ICD-10-CM

## 2022-03-27 DIAGNOSIS — D599 Acquired hemolytic anemia, unspecified: Principal | ICD-10-CM

## 2022-03-27 DIAGNOSIS — R112 Nausea with vomiting, unspecified: Principal | ICD-10-CM

## 2022-03-27 DIAGNOSIS — D474 Osteomyelofibrosis: Principal | ICD-10-CM

## 2022-03-27 DIAGNOSIS — K1230 Oral mucositis (ulcerative), unspecified: Principal | ICD-10-CM

## 2022-03-27 DIAGNOSIS — D473 Essential (hemorrhagic) thrombocythemia: Principal | ICD-10-CM

## 2022-03-27 DIAGNOSIS — Z7682 Awaiting organ transplant status: Principal | ICD-10-CM

## 2022-03-27 DIAGNOSIS — Z888 Allergy status to other drugs, medicaments and biological substances status: Secondary | ICD-10-CM | POA: Diagnosis not present

## 2022-03-27 DIAGNOSIS — Z79899 Other long term (current) drug therapy: Secondary | ICD-10-CM | POA: Diagnosis not present

## 2022-03-27 DIAGNOSIS — Z780 Asymptomatic menopausal state: Secondary | ICD-10-CM | POA: Diagnosis not present

## 2022-03-27 DIAGNOSIS — K219 Gastro-esophageal reflux disease without esophagitis: Secondary | ICD-10-CM | POA: Diagnosis not present

## 2022-03-27 DIAGNOSIS — Z881 Allergy status to other antibiotic agents status: Secondary | ICD-10-CM | POA: Diagnosis not present

## 2022-03-27 DIAGNOSIS — Z885 Allergy status to narcotic agent status: Secondary | ICD-10-CM | POA: Diagnosis not present

## 2022-03-27 MED ORDER — MAGNESIUM OXIDE-MAGNESIUM AMINO ACID CHELATE 133 MG TABLET
ORAL_TABLET | Freq: Two times a day (BID) | ORAL | 0 refills | 50 days | Status: CP
Start: 2022-03-27 — End: ?
  Filled 2022-03-27: qty 100, 50d supply, fill #0

## 2022-03-27 MED ORDER — TRAMADOL 50 MG TABLET
ORAL_TABLET | Freq: Four times a day (QID) | ORAL | 0 refills | 5 days | Status: CN | PRN
Start: 2022-03-27 — End: ?

## 2022-03-27 MED ORDER — OLANZAPINE 5 MG TABLET
ORAL_TABLET | Freq: Every evening | ORAL | 2 refills | 15 days | Status: CP
Start: 2022-03-27 — End: 2023-03-27
  Filled 2022-03-27: qty 30, 15d supply, fill #0

## 2022-03-27 MED FILL — TRAMADOL 50 MG TABLET: ORAL | 5 days supply | Qty: 20 | Fill #0

## 2022-03-29 DIAGNOSIS — Z9484 Stem cells transplant status: Principal | ICD-10-CM

## 2022-03-30 ENCOUNTER — Encounter
Admit: 2022-03-30 | Discharge: 2022-03-31 | Payer: PRIVATE HEALTH INSURANCE | Attending: Primary Care | Primary: Primary Care

## 2022-03-30 ENCOUNTER — Ambulatory Visit: Admit: 2022-03-30 | Discharge: 2022-03-31 | Payer: PRIVATE HEALTH INSURANCE

## 2022-03-30 ENCOUNTER — Other Ambulatory Visit: Admit: 2022-03-30 | Discharge: 2022-03-31 | Payer: PRIVATE HEALTH INSURANCE

## 2022-03-30 DIAGNOSIS — Z9484 Stem cells transplant status: Principal | ICD-10-CM

## 2022-03-30 DIAGNOSIS — D474 Osteomyelofibrosis: Principal | ICD-10-CM

## 2022-03-30 DIAGNOSIS — D84822 Immunocompromised state associated with stem cell transplant (CMS-HCC): Principal | ICD-10-CM

## 2022-03-30 DIAGNOSIS — D473 Essential (hemorrhagic) thrombocythemia: Principal | ICD-10-CM

## 2022-03-30 DIAGNOSIS — D599 Acquired hemolytic anemia, unspecified: Principal | ICD-10-CM

## 2022-03-30 DIAGNOSIS — D696 Thrombocytopenia, unspecified: Principal | ICD-10-CM

## 2022-03-30 DIAGNOSIS — Z9481 Bone marrow transplant status: Principal | ICD-10-CM

## 2022-03-30 DIAGNOSIS — Z7682 Awaiting organ transplant status: Principal | ICD-10-CM

## 2022-03-30 MED ORDER — VALACYCLOVIR 500 MG TABLET
ORAL_TABLET | Freq: Two times a day (BID) | ORAL | 0 refills | 7 days | Status: CP
Start: 2022-03-30 — End: 2022-04-06
  Filled 2022-03-30: qty 14, 7d supply, fill #0

## 2022-03-31 DIAGNOSIS — D473 Essential (hemorrhagic) thrombocythemia: Secondary | ICD-10-CM | POA: Diagnosis not present

## 2022-04-01 ENCOUNTER — Ambulatory Visit: Admit: 2022-04-01 | Discharge: 2022-04-02 | Payer: PRIVATE HEALTH INSURANCE

## 2022-04-01 ENCOUNTER — Other Ambulatory Visit: Admit: 2022-04-01 | Discharge: 2022-04-02 | Payer: PRIVATE HEALTH INSURANCE

## 2022-04-01 DIAGNOSIS — D474 Osteomyelofibrosis: Principal | ICD-10-CM

## 2022-04-01 DIAGNOSIS — Z7682 Awaiting organ transplant status: Principal | ICD-10-CM

## 2022-04-01 DIAGNOSIS — Z9484 Stem cells transplant status: Principal | ICD-10-CM

## 2022-04-01 DIAGNOSIS — R112 Nausea with vomiting, unspecified: Principal | ICD-10-CM

## 2022-04-01 DIAGNOSIS — R509 Fever, unspecified: Principal | ICD-10-CM

## 2022-04-01 DIAGNOSIS — D84822 Immunocompromised state associated with stem cell transplant (CMS-HCC): Principal | ICD-10-CM

## 2022-04-01 DIAGNOSIS — D61818 Other pancytopenia: Principal | ICD-10-CM

## 2022-04-01 DIAGNOSIS — M25511 Pain in right shoulder: Principal | ICD-10-CM

## 2022-04-01 DIAGNOSIS — D599 Acquired hemolytic anemia, unspecified: Principal | ICD-10-CM

## 2022-04-01 DIAGNOSIS — Z5181 Encounter for therapeutic drug level monitoring: Principal | ICD-10-CM

## 2022-04-01 DIAGNOSIS — D473 Essential (hemorrhagic) thrombocythemia: Principal | ICD-10-CM

## 2022-04-01 DIAGNOSIS — R161 Splenomegaly, not elsewhere classified: Secondary | ICD-10-CM | POA: Diagnosis not present

## 2022-04-01 DIAGNOSIS — M898X9 Other specified disorders of bone, unspecified site: Secondary | ICD-10-CM | POA: Diagnosis not present

## 2022-04-01 DIAGNOSIS — E611 Iron deficiency: Secondary | ICD-10-CM | POA: Diagnosis not present

## 2022-04-01 DIAGNOSIS — D7581 Myelofibrosis: Secondary | ICD-10-CM | POA: Diagnosis not present

## 2022-04-01 DIAGNOSIS — I10 Essential (primary) hypertension: Secondary | ICD-10-CM | POA: Diagnosis not present

## 2022-04-01 DIAGNOSIS — Z885 Allergy status to narcotic agent status: Secondary | ICD-10-CM | POA: Diagnosis not present

## 2022-04-01 DIAGNOSIS — M549 Dorsalgia, unspecified: Secondary | ICD-10-CM | POA: Diagnosis not present

## 2022-04-01 MED ORDER — AMLODIPINE 5 MG TABLET
ORAL_TABLET | Freq: Every day | ORAL | 11 refills | 30 days | Status: CP
Start: 2022-04-01 — End: 2023-04-01
  Filled 2022-04-01: qty 30, 30d supply, fill #0

## 2022-04-03 ENCOUNTER — Ambulatory Visit: Admit: 2022-04-03 | Discharge: 2022-04-04 | Payer: PRIVATE HEALTH INSURANCE

## 2022-04-03 ENCOUNTER — Ambulatory Visit: Admit: 2022-04-03 | Discharge: 2022-04-04 | Payer: PRIVATE HEALTH INSURANCE | Attending: Family | Primary: Family

## 2022-04-03 ENCOUNTER — Other Ambulatory Visit: Admit: 2022-04-03 | Discharge: 2022-04-04 | Payer: PRIVATE HEALTH INSURANCE

## 2022-04-03 DIAGNOSIS — G620 Drug-induced polyneuropathy: Principal | ICD-10-CM

## 2022-04-03 DIAGNOSIS — Z9484 Stem cells transplant status: Principal | ICD-10-CM

## 2022-04-03 DIAGNOSIS — D474 Osteomyelofibrosis: Principal | ICD-10-CM

## 2022-04-03 DIAGNOSIS — D599 Acquired hemolytic anemia, unspecified: Principal | ICD-10-CM

## 2022-04-03 DIAGNOSIS — Z7682 Awaiting organ transplant status: Principal | ICD-10-CM

## 2022-04-03 DIAGNOSIS — D473 Essential (hemorrhagic) thrombocythemia: Principal | ICD-10-CM

## 2022-04-03 DIAGNOSIS — T451X5A Adverse effect of antineoplastic and immunosuppressive drugs, initial encounter: Principal | ICD-10-CM

## 2022-04-03 DIAGNOSIS — B360 Pityriasis versicolor: Principal | ICD-10-CM

## 2022-04-03 DIAGNOSIS — Z79899 Other long term (current) drug therapy: Secondary | ICD-10-CM | POA: Diagnosis not present

## 2022-04-03 DIAGNOSIS — F419 Anxiety disorder, unspecified: Secondary | ICD-10-CM | POA: Diagnosis not present

## 2022-04-03 DIAGNOSIS — G629 Polyneuropathy, unspecified: Secondary | ICD-10-CM | POA: Diagnosis not present

## 2022-04-03 DIAGNOSIS — G4736 Sleep related hypoventilation in conditions classified elsewhere: Secondary | ICD-10-CM | POA: Diagnosis not present

## 2022-04-03 DIAGNOSIS — I10 Essential (primary) hypertension: Secondary | ICD-10-CM | POA: Diagnosis not present

## 2022-04-03 DIAGNOSIS — R21 Rash and other nonspecific skin eruption: Secondary | ICD-10-CM | POA: Diagnosis not present

## 2022-04-03 MED ORDER — GABAPENTIN 300 MG CAPSULE
ORAL_CAPSULE | Freq: Every evening | ORAL | 1 refills | 90 days | Status: CP
Start: 2022-04-03 — End: 2022-09-30

## 2022-04-06 ENCOUNTER — Ambulatory Visit: Admit: 2022-04-06 | Discharge: 2022-04-06 | Payer: PRIVATE HEALTH INSURANCE

## 2022-04-06 ENCOUNTER — Encounter
Admit: 2022-04-06 | Discharge: 2022-04-06 | Payer: PRIVATE HEALTH INSURANCE | Attending: Primary Care | Primary: Primary Care

## 2022-04-06 ENCOUNTER — Other Ambulatory Visit: Admit: 2022-04-06 | Discharge: 2022-04-06 | Payer: PRIVATE HEALTH INSURANCE

## 2022-04-06 ENCOUNTER — Ambulatory Visit
Admit: 2022-04-06 | Discharge: 2022-04-06 | Payer: PRIVATE HEALTH INSURANCE | Attending: Critical Care Medicine | Primary: Critical Care Medicine

## 2022-04-06 DIAGNOSIS — D473 Essential (hemorrhagic) thrombocythemia: Principal | ICD-10-CM

## 2022-04-06 DIAGNOSIS — D474 Osteomyelofibrosis: Principal | ICD-10-CM

## 2022-04-06 DIAGNOSIS — Z9484 Stem cells transplant status: Principal | ICD-10-CM

## 2022-04-06 DIAGNOSIS — D599 Acquired hemolytic anemia, unspecified: Principal | ICD-10-CM

## 2022-04-06 DIAGNOSIS — Z9481 Bone marrow transplant status: Principal | ICD-10-CM

## 2022-04-06 DIAGNOSIS — E8770 Fluid overload, unspecified: Principal | ICD-10-CM

## 2022-04-06 DIAGNOSIS — I10 Essential (primary) hypertension: Secondary | ICD-10-CM | POA: Diagnosis not present

## 2022-04-06 DIAGNOSIS — R7402 Elevation of levels of lactic acid dehydrogenase (LDH): Secondary | ICD-10-CM | POA: Diagnosis not present

## 2022-04-06 DIAGNOSIS — Z885 Allergy status to narcotic agent status: Secondary | ICD-10-CM | POA: Diagnosis not present

## 2022-04-06 MED ORDER — OLANZAPINE 5 MG TABLET
ORAL_TABLET | Freq: Every evening | ORAL | 2 refills | 15 days | Status: CP
Start: 2022-04-06 — End: 2023-04-06
  Filled 2022-04-08: qty 30, 15d supply, fill #0

## 2022-04-06 MED ORDER — VALACYCLOVIR 500 MG TABLET
ORAL_TABLET | Freq: Two times a day (BID) | ORAL | 11 refills | 30 days | Status: CP
Start: 2022-04-06 — End: ?
  Filled 2022-04-08: qty 60, 30d supply, fill #0

## 2022-04-06 MED ORDER — FLUCONAZOLE 200 MG TABLET
ORAL_TABLET | Freq: Every day | ORAL | 1 refills | 30 days | Status: CP
Start: 2022-04-06 — End: ?
  Filled 2022-04-08: qty 60, 30d supply, fill #0

## 2022-04-06 MED ORDER — URSODIOL 300 MG CAPSULE
ORAL_CAPSULE | Freq: Three times a day (TID) | ORAL | 2 refills | 30 days | Status: CP
Start: 2022-04-06 — End: ?
  Filled 2022-04-08: qty 90, 30d supply, fill #0

## 2022-04-06 MED ORDER — PANTOPRAZOLE 40 MG TABLET,DELAYED RELEASE
ORAL_TABLET | Freq: Every day | ORAL | 2 refills | 30 days | Status: CP
Start: 2022-04-06 — End: ?
  Filled 2022-04-08: qty 30, 30d supply, fill #0

## 2022-04-08 ENCOUNTER — Encounter
Admit: 2022-04-08 | Discharge: 2022-04-09 | Payer: PRIVATE HEALTH INSURANCE | Attending: Critical Care Medicine | Primary: Critical Care Medicine

## 2022-04-08 ENCOUNTER — Ambulatory Visit: Admit: 2022-04-08 | Discharge: 2022-04-09 | Payer: PRIVATE HEALTH INSURANCE

## 2022-04-08 ENCOUNTER — Other Ambulatory Visit: Admit: 2022-04-08 | Discharge: 2022-04-09 | Payer: PRIVATE HEALTH INSURANCE

## 2022-04-08 DIAGNOSIS — D84822 Immunocompromised state associated with stem cell transplant (CMS-HCC): Principal | ICD-10-CM

## 2022-04-08 DIAGNOSIS — D474 Osteomyelofibrosis: Principal | ICD-10-CM

## 2022-04-08 DIAGNOSIS — Z9484 Stem cells transplant status: Principal | ICD-10-CM

## 2022-04-08 DIAGNOSIS — D473 Essential (hemorrhagic) thrombocythemia: Principal | ICD-10-CM

## 2022-04-08 DIAGNOSIS — D599 Acquired hemolytic anemia, unspecified: Principal | ICD-10-CM

## 2022-04-08 DIAGNOSIS — D61818 Other pancytopenia: Principal | ICD-10-CM

## 2022-04-08 DIAGNOSIS — N63 Unspecified lump in unspecified breast: Principal | ICD-10-CM

## 2022-04-08 DIAGNOSIS — Z79899 Other long term (current) drug therapy: Secondary | ICD-10-CM | POA: Diagnosis not present

## 2022-04-08 DIAGNOSIS — Z885 Allergy status to narcotic agent status: Secondary | ICD-10-CM | POA: Diagnosis not present

## 2022-04-08 DIAGNOSIS — D7581 Myelofibrosis: Secondary | ICD-10-CM | POA: Diagnosis not present

## 2022-04-08 DIAGNOSIS — M25511 Pain in right shoulder: Secondary | ICD-10-CM | POA: Diagnosis not present

## 2022-04-08 DIAGNOSIS — I10 Essential (primary) hypertension: Secondary | ICD-10-CM | POA: Diagnosis not present

## 2022-04-08 MED FILL — PREVYMIS 480 MG TABLET: ORAL | 28 days supply | Qty: 28 | Fill #1

## 2022-04-09 ENCOUNTER — Ambulatory Visit: Admit: 2022-04-09 | Discharge: 2022-04-10 | Payer: PRIVATE HEALTH INSURANCE

## 2022-04-09 DIAGNOSIS — D473 Essential (hemorrhagic) thrombocythemia: Secondary | ICD-10-CM | POA: Diagnosis not present

## 2022-04-10 ENCOUNTER — Ambulatory Visit: Admit: 2022-04-10 | Discharge: 2022-04-11 | Payer: PRIVATE HEALTH INSURANCE

## 2022-04-10 ENCOUNTER — Encounter
Admit: 2022-04-10 | Discharge: 2022-04-11 | Payer: PRIVATE HEALTH INSURANCE | Attending: Primary Care | Primary: Primary Care

## 2022-04-10 ENCOUNTER — Other Ambulatory Visit: Admit: 2022-04-10 | Discharge: 2022-04-11 | Payer: PRIVATE HEALTH INSURANCE

## 2022-04-10 DIAGNOSIS — Z9484 Stem cells transplant status: Principal | ICD-10-CM

## 2022-04-10 DIAGNOSIS — D473 Essential (hemorrhagic) thrombocythemia: Principal | ICD-10-CM

## 2022-04-10 DIAGNOSIS — D474 Osteomyelofibrosis: Principal | ICD-10-CM

## 2022-04-10 DIAGNOSIS — D599 Acquired hemolytic anemia, unspecified: Principal | ICD-10-CM

## 2022-04-10 DIAGNOSIS — N63 Unspecified lump in unspecified breast: Principal | ICD-10-CM

## 2022-04-10 DIAGNOSIS — Z9481 Bone marrow transplant status: Principal | ICD-10-CM

## 2022-04-10 DIAGNOSIS — Z7682 Awaiting organ transplant status: Principal | ICD-10-CM

## 2022-04-10 DIAGNOSIS — R928 Other abnormal and inconclusive findings on diagnostic imaging of breast: Principal | ICD-10-CM

## 2022-04-10 DIAGNOSIS — D84822 Immunocompromised state associated with stem cell transplant (CMS-HCC): Principal | ICD-10-CM

## 2022-04-10 DIAGNOSIS — D61818 Other pancytopenia: Principal | ICD-10-CM

## 2022-04-10 DIAGNOSIS — Z885 Allergy status to narcotic agent status: Secondary | ICD-10-CM | POA: Diagnosis not present

## 2022-04-10 DIAGNOSIS — N6002 Solitary cyst of left breast: Secondary | ICD-10-CM | POA: Diagnosis not present

## 2022-04-10 DIAGNOSIS — I10 Essential (primary) hypertension: Secondary | ICD-10-CM | POA: Diagnosis not present

## 2022-04-10 DIAGNOSIS — D7581 Myelofibrosis: Secondary | ICD-10-CM | POA: Diagnosis not present

## 2022-04-10 DIAGNOSIS — Z888 Allergy status to other drugs, medicaments and biological substances status: Secondary | ICD-10-CM | POA: Diagnosis not present

## 2022-04-10 DIAGNOSIS — Z881 Allergy status to other antibiotic agents status: Secondary | ICD-10-CM | POA: Diagnosis not present

## 2022-04-10 DIAGNOSIS — N6323 Unspecified lump in the left breast, lower outer quadrant: Secondary | ICD-10-CM | POA: Diagnosis not present

## 2022-04-10 DIAGNOSIS — Z79899 Other long term (current) drug therapy: Secondary | ICD-10-CM | POA: Diagnosis not present

## 2022-04-14 ENCOUNTER — Encounter
Admit: 2022-04-14 | Discharge: 2022-04-15 | Payer: PRIVATE HEALTH INSURANCE | Attending: Primary Care | Primary: Primary Care

## 2022-04-14 ENCOUNTER — Ambulatory Visit: Admit: 2022-04-14 | Discharge: 2022-04-15 | Payer: PRIVATE HEALTH INSURANCE

## 2022-04-14 ENCOUNTER — Other Ambulatory Visit: Admit: 2022-04-14 | Discharge: 2022-04-15 | Payer: PRIVATE HEALTH INSURANCE

## 2022-04-14 ENCOUNTER — Ambulatory Visit
Admit: 2022-04-14 | Discharge: 2022-04-15 | Payer: PRIVATE HEALTH INSURANCE | Attending: Nurse Practitioner | Primary: Nurse Practitioner

## 2022-04-14 DIAGNOSIS — Z9481 Bone marrow transplant status: Principal | ICD-10-CM

## 2022-04-14 DIAGNOSIS — D474 Osteomyelofibrosis: Principal | ICD-10-CM

## 2022-04-14 DIAGNOSIS — Z9484 Stem cells transplant status: Principal | ICD-10-CM

## 2022-04-14 DIAGNOSIS — D473 Essential (hemorrhagic) thrombocythemia: Principal | ICD-10-CM

## 2022-04-14 DIAGNOSIS — D599 Acquired hemolytic anemia, unspecified: Principal | ICD-10-CM

## 2022-04-14 DIAGNOSIS — T451X5A Adverse effect of antineoplastic and immunosuppressive drugs, initial encounter: Principal | ICD-10-CM

## 2022-04-14 DIAGNOSIS — G620 Drug-induced polyneuropathy: Principal | ICD-10-CM

## 2022-04-14 DIAGNOSIS — D84822 Immunocompromised state associated with stem cell transplant (CMS-HCC): Principal | ICD-10-CM

## 2022-04-14 DIAGNOSIS — Z7682 Awaiting organ transplant status: Principal | ICD-10-CM

## 2022-04-14 DIAGNOSIS — Z885 Allergy status to narcotic agent status: Secondary | ICD-10-CM | POA: Diagnosis not present

## 2022-04-14 DIAGNOSIS — D7581 Myelofibrosis: Secondary | ICD-10-CM | POA: Diagnosis not present

## 2022-04-14 DIAGNOSIS — N644 Mastodynia: Secondary | ICD-10-CM | POA: Diagnosis not present

## 2022-04-14 DIAGNOSIS — D61818 Other pancytopenia: Secondary | ICD-10-CM | POA: Diagnosis not present

## 2022-04-14 DIAGNOSIS — I10 Essential (primary) hypertension: Secondary | ICD-10-CM | POA: Diagnosis not present

## 2022-04-14 DIAGNOSIS — Z79899 Other long term (current) drug therapy: Secondary | ICD-10-CM | POA: Diagnosis not present

## 2022-04-14 DIAGNOSIS — R7402 Elevation of levels of lactic acid dehydrogenase (LDH): Secondary | ICD-10-CM | POA: Diagnosis not present

## 2022-04-14 MED ORDER — MAGNESIUM OXIDE-MAGNESIUM AMINO ACID CHELATE 133 MG TABLET
ORAL_TABLET | Freq: Two times a day (BID) | ORAL | 3 refills | 25 days | Status: CP
Start: 2022-04-14 — End: ?
  Filled 2022-04-14: qty 100, 25d supply, fill #0

## 2022-04-17 ENCOUNTER — Ambulatory Visit: Admit: 2022-04-17 | Discharge: 2022-04-17 | Payer: PRIVATE HEALTH INSURANCE | Attending: Family | Primary: Family

## 2022-04-17 ENCOUNTER — Other Ambulatory Visit: Admit: 2022-04-17 | Discharge: 2022-04-17 | Payer: PRIVATE HEALTH INSURANCE

## 2022-04-17 ENCOUNTER — Encounter
Admit: 2022-04-17 | Discharge: 2022-04-17 | Payer: PRIVATE HEALTH INSURANCE | Attending: Primary Care | Primary: Primary Care

## 2022-04-17 ENCOUNTER — Ambulatory Visit: Admit: 2022-04-17 | Discharge: 2022-04-17 | Payer: PRIVATE HEALTH INSURANCE

## 2022-04-17 DIAGNOSIS — D84822 Immunocompromised state associated with stem cell transplant (CMS-HCC): Principal | ICD-10-CM

## 2022-04-17 DIAGNOSIS — Z7682 Awaiting organ transplant status: Principal | ICD-10-CM

## 2022-04-17 DIAGNOSIS — Z9484 Stem cells transplant status: Principal | ICD-10-CM

## 2022-04-17 DIAGNOSIS — T451X5A Adverse effect of antineoplastic and immunosuppressive drugs, initial encounter: Principal | ICD-10-CM

## 2022-04-17 DIAGNOSIS — D599 Acquired hemolytic anemia, unspecified: Principal | ICD-10-CM

## 2022-04-17 DIAGNOSIS — Z9481 Bone marrow transplant status: Principal | ICD-10-CM

## 2022-04-17 DIAGNOSIS — G620 Drug-induced polyneuropathy: Principal | ICD-10-CM

## 2022-04-17 DIAGNOSIS — Z881 Allergy status to other antibiotic agents status: Secondary | ICD-10-CM | POA: Diagnosis not present

## 2022-04-17 DIAGNOSIS — Z885 Allergy status to narcotic agent status: Secondary | ICD-10-CM | POA: Diagnosis not present

## 2022-04-17 DIAGNOSIS — Z888 Allergy status to other drugs, medicaments and biological substances status: Secondary | ICD-10-CM | POA: Diagnosis not present

## 2022-04-17 MED ORDER — GABAPENTIN 300 MG CAPSULE
ORAL_CAPSULE | Freq: Every evening | ORAL | 5 refills | 30 days | Status: CP
Start: 2022-04-17 — End: 2022-10-14
  Filled 2022-04-24: qty 60, 30d supply, fill #0

## 2022-04-21 ENCOUNTER — Ambulatory Visit: Admit: 2022-04-21 | Discharge: 2022-04-22 | Payer: PRIVATE HEALTH INSURANCE

## 2022-04-21 ENCOUNTER — Other Ambulatory Visit: Admit: 2022-04-21 | Discharge: 2022-04-22 | Payer: PRIVATE HEALTH INSURANCE

## 2022-04-21 DIAGNOSIS — Z9481 Bone marrow transplant status: Principal | ICD-10-CM

## 2022-04-21 DIAGNOSIS — Z9484 Stem cells transplant status: Principal | ICD-10-CM

## 2022-04-21 DIAGNOSIS — D599 Acquired hemolytic anemia, unspecified: Principal | ICD-10-CM

## 2022-04-21 DIAGNOSIS — Z7682 Awaiting organ transplant status: Principal | ICD-10-CM

## 2022-04-21 DIAGNOSIS — D84822 Immunodeficiency due to external causes: Secondary | ICD-10-CM | POA: Diagnosis not present

## 2022-04-21 DIAGNOSIS — Z5181 Encounter for therapeutic drug level monitoring: Secondary | ICD-10-CM | POA: Diagnosis not present

## 2022-04-21 DIAGNOSIS — D471 Chronic myeloproliferative disease: Secondary | ICD-10-CM | POA: Diagnosis not present

## 2022-04-21 DIAGNOSIS — Z09 Encounter for follow-up examination after completed treatment for conditions other than malignant neoplasm: Secondary | ICD-10-CM | POA: Diagnosis not present

## 2022-04-23 DIAGNOSIS — D473 Essential (hemorrhagic) thrombocythemia: Secondary | ICD-10-CM | POA: Diagnosis not present

## 2022-04-23 MED ORDER — OLANZAPINE 5 MG TABLET
ORAL_TABLET | Freq: Every evening | ORAL | 1 refills | 30 days | Status: CP
Start: 2022-04-23 — End: 2023-04-23
  Filled 2022-04-24: qty 30, 30d supply, fill #0

## 2022-04-24 ENCOUNTER — Ambulatory Visit: Admit: 2022-04-24 | Discharge: 2022-04-24 | Payer: PRIVATE HEALTH INSURANCE

## 2022-04-24 ENCOUNTER — Encounter: Admit: 2022-04-24 | Discharge: 2022-04-24 | Payer: PRIVATE HEALTH INSURANCE

## 2022-04-24 ENCOUNTER — Other Ambulatory Visit: Admit: 2022-04-24 | Discharge: 2022-04-24 | Payer: PRIVATE HEALTH INSURANCE

## 2022-04-24 DIAGNOSIS — D84822 Immunocompromised state associated with stem cell transplant (CMS-HCC): Principal | ICD-10-CM

## 2022-04-24 DIAGNOSIS — Z7682 Awaiting organ transplant status: Principal | ICD-10-CM

## 2022-04-24 DIAGNOSIS — D474 Osteomyelofibrosis: Principal | ICD-10-CM

## 2022-04-24 DIAGNOSIS — D61818 Other pancytopenia: Principal | ICD-10-CM

## 2022-04-24 DIAGNOSIS — Z9484 Stem cells transplant status: Principal | ICD-10-CM

## 2022-04-24 DIAGNOSIS — Z9481 Bone marrow transplant status: Principal | ICD-10-CM

## 2022-04-24 DIAGNOSIS — D599 Acquired hemolytic anemia, unspecified: Principal | ICD-10-CM

## 2022-04-24 DIAGNOSIS — D473 Essential (hemorrhagic) thrombocythemia: Principal | ICD-10-CM

## 2022-04-24 DIAGNOSIS — Z79621 Long term (current) use of calcineurin inhibitor: Secondary | ICD-10-CM | POA: Diagnosis not present

## 2022-04-24 DIAGNOSIS — R21 Rash and other nonspecific skin eruption: Secondary | ICD-10-CM | POA: Diagnosis not present

## 2022-04-24 DIAGNOSIS — I10 Essential (primary) hypertension: Secondary | ICD-10-CM | POA: Diagnosis not present

## 2022-04-24 DIAGNOSIS — G4736 Sleep related hypoventilation in conditions classified elsewhere: Secondary | ICD-10-CM | POA: Diagnosis not present

## 2022-04-24 DIAGNOSIS — Z79899 Other long term (current) drug therapy: Secondary | ICD-10-CM | POA: Diagnosis not present

## 2022-04-24 DIAGNOSIS — G629 Polyneuropathy, unspecified: Secondary | ICD-10-CM | POA: Diagnosis not present

## 2022-04-24 DIAGNOSIS — M25512 Pain in left shoulder: Secondary | ICD-10-CM | POA: Diagnosis not present

## 2022-04-24 DIAGNOSIS — F419 Anxiety disorder, unspecified: Secondary | ICD-10-CM | POA: Diagnosis not present

## 2022-04-24 MED ORDER — MUPIROCIN 2 % TOPICAL OINTMENT
Freq: Three times a day (TID) | TOPICAL | 0 refills | 7 days | Status: CP
Start: 2022-04-24 — End: 2022-05-01
  Filled 2022-04-24: qty 22, 7d supply, fill #0

## 2022-04-28 ENCOUNTER — Other Ambulatory Visit: Admit: 2022-04-28 | Discharge: 2022-04-29 | Payer: PRIVATE HEALTH INSURANCE

## 2022-04-28 ENCOUNTER — Encounter: Admit: 2022-04-28 | Discharge: 2022-04-29 | Payer: PRIVATE HEALTH INSURANCE

## 2022-04-28 ENCOUNTER — Ambulatory Visit
Admit: 2022-04-28 | Discharge: 2022-04-29 | Payer: PRIVATE HEALTH INSURANCE | Attending: Student in an Organized Health Care Education/Training Program | Primary: Student in an Organized Health Care Education/Training Program

## 2022-04-28 ENCOUNTER — Ambulatory Visit: Admit: 2022-04-28 | Discharge: 2022-04-29 | Payer: PRIVATE HEALTH INSURANCE

## 2022-04-28 DIAGNOSIS — Z7682 Awaiting organ transplant status: Principal | ICD-10-CM

## 2022-04-28 DIAGNOSIS — Z9484 Stem cells transplant status: Principal | ICD-10-CM

## 2022-04-28 DIAGNOSIS — D599 Acquired hemolytic anemia, unspecified: Principal | ICD-10-CM

## 2022-04-28 DIAGNOSIS — Z9481 Bone marrow transplant status: Principal | ICD-10-CM

## 2022-04-28 DIAGNOSIS — D61818 Other pancytopenia: Secondary | ICD-10-CM | POA: Diagnosis not present

## 2022-04-28 DIAGNOSIS — Z888 Allergy status to other drugs, medicaments and biological substances status: Secondary | ICD-10-CM | POA: Diagnosis not present

## 2022-04-28 DIAGNOSIS — Z881 Allergy status to other antibiotic agents status: Secondary | ICD-10-CM | POA: Diagnosis not present

## 2022-04-28 DIAGNOSIS — D473 Essential (hemorrhagic) thrombocythemia: Secondary | ICD-10-CM | POA: Diagnosis not present

## 2022-04-28 DIAGNOSIS — L089 Local infection of the skin and subcutaneous tissue, unspecified: Secondary | ICD-10-CM | POA: Diagnosis not present

## 2022-04-28 DIAGNOSIS — I10 Essential (primary) hypertension: Secondary | ICD-10-CM | POA: Diagnosis not present

## 2022-04-28 DIAGNOSIS — Z885 Allergy status to narcotic agent status: Secondary | ICD-10-CM | POA: Diagnosis not present

## 2022-04-28 DIAGNOSIS — D474 Osteomyelofibrosis: Secondary | ICD-10-CM | POA: Diagnosis not present

## 2022-04-28 DIAGNOSIS — D84822 Immunodeficiency due to external causes: Secondary | ICD-10-CM | POA: Diagnosis not present

## 2022-04-28 MED ORDER — DOXYCYCLINE HYCLATE 100 MG CAPSULE
ORAL_CAPSULE | Freq: Two times a day (BID) | ORAL | 0 refills | 7 days | Status: CP
Start: 2022-04-28 — End: ?
  Filled 2022-04-28: qty 14, 7d supply, fill #0

## 2022-04-30 DIAGNOSIS — L089 Local infection of the skin and subcutaneous tissue, unspecified: Principal | ICD-10-CM

## 2022-04-30 DIAGNOSIS — D473 Essential (hemorrhagic) thrombocythemia: Principal | ICD-10-CM

## 2022-04-30 DIAGNOSIS — D474 Osteomyelofibrosis: Principal | ICD-10-CM

## 2022-04-30 MED ORDER — CEPHALEXIN 500 MG CAPSULE
ORAL_CAPSULE | Freq: Four times a day (QID) | ORAL | 0 refills | 10 days | Status: CP
Start: 2022-04-30 — End: 2022-05-10
  Filled 2022-05-01: qty 40, 10d supply, fill #0

## 2022-05-01 ENCOUNTER — Other Ambulatory Visit: Admit: 2022-05-01 | Discharge: 2022-05-02 | Payer: PRIVATE HEALTH INSURANCE

## 2022-05-01 ENCOUNTER — Ambulatory Visit
Admit: 2022-05-01 | Discharge: 2022-05-02 | Payer: PRIVATE HEALTH INSURANCE | Attending: Student in an Organized Health Care Education/Training Program | Primary: Student in an Organized Health Care Education/Training Program

## 2022-05-01 DIAGNOSIS — D473 Essential (hemorrhagic) thrombocythemia: Principal | ICD-10-CM

## 2022-05-01 DIAGNOSIS — D84822 Immunocompromised state associated with stem cell transplant (CMS-HCC): Principal | ICD-10-CM

## 2022-05-01 DIAGNOSIS — D474 Osteomyelofibrosis: Principal | ICD-10-CM

## 2022-05-01 DIAGNOSIS — D599 Acquired hemolytic anemia, unspecified: Principal | ICD-10-CM

## 2022-05-01 DIAGNOSIS — D61818 Other pancytopenia: Principal | ICD-10-CM

## 2022-05-01 DIAGNOSIS — Z9484 Stem cells transplant status: Principal | ICD-10-CM

## 2022-05-01 DIAGNOSIS — I10 Essential (primary) hypertension: Secondary | ICD-10-CM | POA: Diagnosis not present

## 2022-05-02 DIAGNOSIS — D474 Osteomyelofibrosis: Principal | ICD-10-CM

## 2022-05-02 DIAGNOSIS — D473 Essential (hemorrhagic) thrombocythemia: Principal | ICD-10-CM

## 2022-05-04 DIAGNOSIS — D473 Essential (hemorrhagic) thrombocythemia: Principal | ICD-10-CM

## 2022-05-04 DIAGNOSIS — D474 Osteomyelofibrosis: Principal | ICD-10-CM

## 2022-05-04 MED ORDER — TACROLIMUS 1 MG CAPSULE, IMMEDIATE-RELEASE
ORAL_CAPSULE | ORAL | 5 refills | 30 days | Status: CP
Start: 2022-05-04 — End: ?

## 2022-05-05 ENCOUNTER — Other Ambulatory Visit: Admit: 2022-05-05 | Discharge: 2022-05-06 | Payer: PRIVATE HEALTH INSURANCE

## 2022-05-05 ENCOUNTER — Ambulatory Visit
Admit: 2022-05-05 | Discharge: 2022-05-06 | Payer: PRIVATE HEALTH INSURANCE | Attending: Critical Care Medicine | Primary: Critical Care Medicine

## 2022-05-05 ENCOUNTER — Encounter: Admit: 2022-05-05 | Discharge: 2022-05-06 | Payer: PRIVATE HEALTH INSURANCE

## 2022-05-05 DIAGNOSIS — D599 Acquired hemolytic anemia, unspecified: Principal | ICD-10-CM

## 2022-05-05 DIAGNOSIS — Z9484 Stem cells transplant status: Principal | ICD-10-CM

## 2022-05-05 DIAGNOSIS — D61818 Other pancytopenia: Principal | ICD-10-CM

## 2022-05-05 DIAGNOSIS — D474 Osteomyelofibrosis: Principal | ICD-10-CM

## 2022-05-05 DIAGNOSIS — Z5181 Encounter for therapeutic drug level monitoring: Principal | ICD-10-CM

## 2022-05-05 DIAGNOSIS — D473 Essential (hemorrhagic) thrombocythemia: Principal | ICD-10-CM

## 2022-05-05 DIAGNOSIS — L089 Local infection of the skin and subcutaneous tissue, unspecified: Principal | ICD-10-CM

## 2022-05-05 DIAGNOSIS — I10 Essential (primary) hypertension: Secondary | ICD-10-CM | POA: Diagnosis not present

## 2022-05-05 DIAGNOSIS — Z79899 Other long term (current) drug therapy: Secondary | ICD-10-CM | POA: Diagnosis not present

## 2022-05-05 DIAGNOSIS — D7581 Myelofibrosis: Secondary | ICD-10-CM | POA: Diagnosis not present

## 2022-05-05 DIAGNOSIS — K219 Gastro-esophageal reflux disease without esophagitis: Secondary | ICD-10-CM | POA: Diagnosis not present

## 2022-05-05 DIAGNOSIS — D471 Chronic myeloproliferative disease: Secondary | ICD-10-CM | POA: Diagnosis not present

## 2022-05-05 DIAGNOSIS — Z885 Allergy status to narcotic agent status: Secondary | ICD-10-CM | POA: Diagnosis not present

## 2022-05-05 MED ORDER — MAGNESIUM OXIDE-MAGNESIUM AMINO ACID CHELATE 133 MG TABLET
ORAL_TABLET | Freq: Three times a day (TID) | ORAL | 3 refills | 34 days | Status: CP
Start: 2022-05-05 — End: ?
  Filled 2022-05-11: qty 300, 34d supply, fill #0

## 2022-05-05 MED FILL — VALACYCLOVIR 500 MG TABLET: ORAL | 30 days supply | Qty: 60 | Fill #1

## 2022-05-05 MED FILL — PREVYMIS 480 MG TABLET: ORAL | 28 days supply | Qty: 28 | Fill #2

## 2022-05-08 ENCOUNTER — Other Ambulatory Visit: Admit: 2022-05-08 | Discharge: 2022-05-08 | Payer: PRIVATE HEALTH INSURANCE

## 2022-05-08 ENCOUNTER — Ambulatory Visit: Admit: 2022-05-08 | Discharge: 2022-05-08 | Payer: PRIVATE HEALTH INSURANCE

## 2022-05-08 ENCOUNTER — Encounter: Admit: 2022-05-08 | Discharge: 2022-05-08 | Payer: PRIVATE HEALTH INSURANCE

## 2022-05-08 DIAGNOSIS — D474 Osteomyelofibrosis: Principal | ICD-10-CM

## 2022-05-08 DIAGNOSIS — Z9484 Stem cells transplant status: Principal | ICD-10-CM

## 2022-05-08 DIAGNOSIS — D599 Acquired hemolytic anemia, unspecified: Principal | ICD-10-CM

## 2022-05-08 DIAGNOSIS — D61818 Other pancytopenia: Principal | ICD-10-CM

## 2022-05-08 DIAGNOSIS — D473 Essential (hemorrhagic) thrombocythemia: Principal | ICD-10-CM

## 2022-05-08 DIAGNOSIS — L089 Local infection of the skin and subcutaneous tissue, unspecified: Principal | ICD-10-CM

## 2022-05-08 DIAGNOSIS — Z7682 Awaiting organ transplant status: Principal | ICD-10-CM

## 2022-05-08 DIAGNOSIS — I10 Essential (primary) hypertension: Secondary | ICD-10-CM | POA: Diagnosis not present

## 2022-05-08 DIAGNOSIS — Z792 Long term (current) use of antibiotics: Secondary | ICD-10-CM | POA: Diagnosis not present

## 2022-05-08 DIAGNOSIS — R21 Rash and other nonspecific skin eruption: Secondary | ICD-10-CM | POA: Diagnosis not present

## 2022-05-08 DIAGNOSIS — D7581 Myelofibrosis: Secondary | ICD-10-CM | POA: Diagnosis not present

## 2022-05-08 DIAGNOSIS — Z885 Allergy status to narcotic agent status: Secondary | ICD-10-CM | POA: Diagnosis not present

## 2022-05-08 DIAGNOSIS — T50905A Adverse effect of unspecified drugs, medicaments and biological substances, initial encounter: Secondary | ICD-10-CM | POA: Diagnosis not present

## 2022-05-11 ENCOUNTER — Other Ambulatory Visit: Admit: 2022-05-11 | Discharge: 2022-05-11 | Payer: PRIVATE HEALTH INSURANCE

## 2022-05-11 ENCOUNTER — Ambulatory Visit: Admit: 2022-05-11 | Discharge: 2022-05-11 | Payer: PRIVATE HEALTH INSURANCE

## 2022-05-11 ENCOUNTER — Encounter: Admit: 2022-05-11 | Discharge: 2022-05-11 | Payer: PRIVATE HEALTH INSURANCE

## 2022-05-11 DIAGNOSIS — Z9484 Stem cells transplant status: Principal | ICD-10-CM

## 2022-05-11 DIAGNOSIS — Z7682 Awaiting organ transplant status: Principal | ICD-10-CM

## 2022-05-11 DIAGNOSIS — D599 Acquired hemolytic anemia, unspecified: Principal | ICD-10-CM

## 2022-05-11 DIAGNOSIS — Z885 Allergy status to narcotic agent status: Secondary | ICD-10-CM | POA: Diagnosis not present

## 2022-05-11 DIAGNOSIS — D474 Osteomyelofibrosis: Secondary | ICD-10-CM | POA: Diagnosis not present

## 2022-05-11 DIAGNOSIS — D7581 Myelofibrosis: Secondary | ICD-10-CM | POA: Diagnosis not present

## 2022-05-11 DIAGNOSIS — Z79899 Other long term (current) drug therapy: Secondary | ICD-10-CM | POA: Diagnosis not present

## 2022-05-11 DIAGNOSIS — D84822 Immunodeficiency due to external causes: Secondary | ICD-10-CM | POA: Diagnosis not present

## 2022-05-11 DIAGNOSIS — Z5181 Encounter for therapeutic drug level monitoring: Secondary | ICD-10-CM | POA: Diagnosis not present

## 2022-05-11 DIAGNOSIS — I10 Essential (primary) hypertension: Secondary | ICD-10-CM | POA: Diagnosis not present

## 2022-05-11 DIAGNOSIS — D61818 Other pancytopenia: Secondary | ICD-10-CM | POA: Diagnosis not present

## 2022-05-11 DIAGNOSIS — D473 Essential (hemorrhagic) thrombocythemia: Secondary | ICD-10-CM | POA: Diagnosis not present

## 2022-05-11 MED ORDER — TRAMADOL 50 MG TABLET
ORAL_TABLET | Freq: Four times a day (QID) | ORAL | 0 refills | 5 days | Status: CP | PRN
Start: 2022-05-11 — End: ?

## 2022-05-14 ENCOUNTER — Other Ambulatory Visit: Admit: 2022-05-14 | Discharge: 2022-05-14 | Payer: PRIVATE HEALTH INSURANCE

## 2022-05-14 ENCOUNTER — Ambulatory Visit: Admit: 2022-05-14 | Discharge: 2022-05-14 | Payer: PRIVATE HEALTH INSURANCE

## 2022-05-14 ENCOUNTER — Encounter: Admit: 2022-05-14 | Discharge: 2022-05-14 | Payer: PRIVATE HEALTH INSURANCE

## 2022-05-14 DIAGNOSIS — D474 Osteomyelofibrosis: Principal | ICD-10-CM

## 2022-05-14 DIAGNOSIS — Z7682 Awaiting organ transplant status: Principal | ICD-10-CM

## 2022-05-14 DIAGNOSIS — D473 Essential (hemorrhagic) thrombocythemia: Principal | ICD-10-CM

## 2022-05-14 DIAGNOSIS — Z5181 Encounter for therapeutic drug level monitoring: Principal | ICD-10-CM

## 2022-05-14 DIAGNOSIS — D61818 Other pancytopenia: Principal | ICD-10-CM

## 2022-05-14 DIAGNOSIS — Z9484 Stem cells transplant status: Principal | ICD-10-CM

## 2022-05-14 DIAGNOSIS — D599 Acquired hemolytic anemia, unspecified: Principal | ICD-10-CM

## 2022-05-14 DIAGNOSIS — D84822 Immunocompromised state associated with stem cell transplant (CMS-HCC): Principal | ICD-10-CM

## 2022-05-14 MED ORDER — TACROLIMUS 1 MG CAPSULE, IMMEDIATE-RELEASE
ORAL_CAPSULE | Freq: Two times a day (BID) | ORAL | 5 refills | 30 days | Status: CP
Start: 2022-05-14 — End: ?

## 2022-05-16 DIAGNOSIS — Z9484 Stem cells transplant status: Secondary | ICD-10-CM | POA: Diagnosis not present

## 2022-05-19 ENCOUNTER — Ambulatory Visit: Admit: 2022-05-19 | Discharge: 2022-05-20 | Payer: PRIVATE HEALTH INSURANCE

## 2022-05-19 ENCOUNTER — Other Ambulatory Visit: Admit: 2022-05-19 | Discharge: 2022-05-20 | Payer: PRIVATE HEALTH INSURANCE

## 2022-05-19 ENCOUNTER — Encounter: Admit: 2022-05-19 | Discharge: 2022-05-20 | Payer: PRIVATE HEALTH INSURANCE

## 2022-05-19 DIAGNOSIS — T451X5A Adverse effect of antineoplastic and immunosuppressive drugs, initial encounter: Principal | ICD-10-CM

## 2022-05-19 DIAGNOSIS — Z7682 Awaiting organ transplant status: Principal | ICD-10-CM

## 2022-05-19 DIAGNOSIS — G620 Drug-induced polyneuropathy: Principal | ICD-10-CM

## 2022-05-19 DIAGNOSIS — D599 Acquired hemolytic anemia, unspecified: Principal | ICD-10-CM

## 2022-05-19 DIAGNOSIS — Z9484 Stem cells transplant status: Principal | ICD-10-CM

## 2022-05-19 DIAGNOSIS — D471 Chronic myeloproliferative disease: Secondary | ICD-10-CM | POA: Diagnosis not present

## 2022-05-19 DIAGNOSIS — Z48290 Encounter for aftercare following bone marrow transplant: Secondary | ICD-10-CM | POA: Diagnosis not present

## 2022-05-19 DIAGNOSIS — G629 Polyneuropathy, unspecified: Secondary | ICD-10-CM | POA: Diagnosis not present

## 2022-05-19 DIAGNOSIS — Z5181 Encounter for therapeutic drug level monitoring: Secondary | ICD-10-CM | POA: Diagnosis not present

## 2022-05-19 DIAGNOSIS — Z79899 Other long term (current) drug therapy: Secondary | ICD-10-CM | POA: Diagnosis not present

## 2022-05-19 DIAGNOSIS — G4734 Idiopathic sleep related nonobstructive alveolar hypoventilation: Secondary | ICD-10-CM | POA: Diagnosis not present

## 2022-05-19 DIAGNOSIS — Z9481 Bone marrow transplant status: Secondary | ICD-10-CM | POA: Diagnosis not present

## 2022-05-19 DIAGNOSIS — Z9221 Personal history of antineoplastic chemotherapy: Secondary | ICD-10-CM | POA: Diagnosis not present

## 2022-05-19 DIAGNOSIS — D84822 Immunodeficiency due to external causes: Secondary | ICD-10-CM | POA: Diagnosis not present

## 2022-05-19 DIAGNOSIS — D7581 Myelofibrosis: Secondary | ICD-10-CM | POA: Diagnosis not present

## 2022-05-19 DIAGNOSIS — M25512 Pain in left shoulder: Secondary | ICD-10-CM | POA: Diagnosis not present

## 2022-05-19 DIAGNOSIS — I10 Essential (primary) hypertension: Secondary | ICD-10-CM | POA: Diagnosis not present

## 2022-05-19 DIAGNOSIS — Z79621 Long term (current) use of calcineurin inhibitor: Secondary | ICD-10-CM | POA: Diagnosis not present

## 2022-05-19 DIAGNOSIS — N941 Unspecified dyspareunia: Secondary | ICD-10-CM | POA: Diagnosis not present

## 2022-05-19 MED ORDER — GABAPENTIN 300 MG CAPSULE
ORAL_CAPSULE | Freq: Every evening | ORAL | 5 refills | 30 days | Status: CP
Start: 2022-05-19 — End: 2022-11-15

## 2022-05-20 ENCOUNTER — Ambulatory Visit: Admit: 2022-05-20 | Discharge: 2022-05-21 | Payer: PRIVATE HEALTH INSURANCE | Attending: Family | Primary: Family

## 2022-05-20 DIAGNOSIS — G620 Drug-induced polyneuropathy: Principal | ICD-10-CM

## 2022-05-20 DIAGNOSIS — T451X5A Adverse effect of antineoplastic and immunosuppressive drugs, initial encounter: Principal | ICD-10-CM

## 2022-05-20 DIAGNOSIS — Z452 Encounter for adjustment and management of vascular access device: Secondary | ICD-10-CM | POA: Diagnosis not present

## 2022-05-20 DIAGNOSIS — Z9484 Stem cells transplant status: Secondary | ICD-10-CM | POA: Diagnosis not present

## 2022-05-20 DIAGNOSIS — S46091A Other injury of muscle(s) and tendon(s) of the rotator cuff of right shoulder, initial encounter: Secondary | ICD-10-CM | POA: Diagnosis not present

## 2022-05-25 DIAGNOSIS — D474 Osteomyelofibrosis: Principal | ICD-10-CM

## 2022-05-25 DIAGNOSIS — D473 Essential (hemorrhagic) thrombocythemia: Principal | ICD-10-CM

## 2022-05-26 DIAGNOSIS — Z9484 Stem cells transplant status: Principal | ICD-10-CM

## 2022-05-27 ENCOUNTER — Other Ambulatory Visit: Admit: 2022-05-27 | Discharge: 2022-05-27 | Payer: PRIVATE HEALTH INSURANCE

## 2022-05-27 ENCOUNTER — Encounter: Admit: 2022-05-27 | Discharge: 2022-05-27 | Payer: PRIVATE HEALTH INSURANCE

## 2022-05-27 ENCOUNTER — Ambulatory Visit
Admit: 2022-05-27 | Discharge: 2022-05-27 | Payer: PRIVATE HEALTH INSURANCE | Attending: Student in an Organized Health Care Education/Training Program | Primary: Student in an Organized Health Care Education/Training Program

## 2022-05-27 ENCOUNTER — Institutional Professional Consult (permissible substitution): Admit: 2022-05-27 | Discharge: 2022-05-27 | Payer: PRIVATE HEALTH INSURANCE

## 2022-05-27 ENCOUNTER — Ambulatory Visit
Admit: 2022-05-27 | Discharge: 2022-05-27 | Payer: PRIVATE HEALTH INSURANCE | Attending: Pharmacist Clinician (PhC)/ Clinical Pharmacy Specialist | Primary: Pharmacist Clinician (PhC)/ Clinical Pharmacy Specialist

## 2022-05-27 ENCOUNTER — Ambulatory Visit: Admit: 2022-05-27 | Discharge: 2022-05-27 | Payer: PRIVATE HEALTH INSURANCE

## 2022-05-27 DIAGNOSIS — D599 Acquired hemolytic anemia, unspecified: Secondary | ICD-10-CM | POA: Diagnosis not present

## 2022-05-27 DIAGNOSIS — Z885 Allergy status to narcotic agent status: Secondary | ICD-10-CM | POA: Diagnosis not present

## 2022-05-27 DIAGNOSIS — H04123 Dry eye syndrome of bilateral lacrimal glands: Secondary | ICD-10-CM | POA: Diagnosis not present

## 2022-05-27 DIAGNOSIS — N898 Other specified noninflammatory disorders of vagina: Secondary | ICD-10-CM | POA: Diagnosis not present

## 2022-05-27 DIAGNOSIS — D61818 Other pancytopenia: Secondary | ICD-10-CM | POA: Diagnosis not present

## 2022-05-27 DIAGNOSIS — Z9481 Bone marrow transplant status: Secondary | ICD-10-CM | POA: Diagnosis not present

## 2022-05-27 DIAGNOSIS — D473 Essential (hemorrhagic) thrombocythemia: Secondary | ICD-10-CM | POA: Diagnosis not present

## 2022-05-27 DIAGNOSIS — D84822 Immunodeficiency due to external causes: Secondary | ICD-10-CM | POA: Diagnosis not present

## 2022-05-27 DIAGNOSIS — Z9189 Other specified personal risk factors, not elsewhere classified: Secondary | ICD-10-CM | POA: Diagnosis not present

## 2022-05-27 DIAGNOSIS — I10 Essential (primary) hypertension: Secondary | ICD-10-CM | POA: Diagnosis not present

## 2022-05-27 DIAGNOSIS — D474 Osteomyelofibrosis: Secondary | ICD-10-CM | POA: Diagnosis not present

## 2022-05-27 DIAGNOSIS — Z9484 Stem cells transplant status: Secondary | ICD-10-CM | POA: Diagnosis not present

## 2022-05-27 MED ORDER — TACROLIMUS 1 MG CAPSULE, IMMEDIATE-RELEASE
ORAL_CAPSULE | ORAL | 2 refills | 30 days | Status: CP
Start: 2022-05-27 — End: ?

## 2022-05-27 MED ORDER — VALACYCLOVIR 500 MG TABLET
ORAL_TABLET | Freq: Two times a day (BID) | ORAL | 9 refills | 30 days | Status: CP
Start: 2022-05-27 — End: ?

## 2022-05-27 MED ORDER — SERTRALINE 25 MG TABLET
ORAL_TABLET | Freq: Every day | ORAL | 0 refills | 45 days | Status: CP
Start: 2022-05-27 — End: ?

## 2022-05-28 DIAGNOSIS — M25511 Pain in right shoulder: Secondary | ICD-10-CM | POA: Diagnosis not present

## 2022-06-02 DIAGNOSIS — S43432D Superior glenoid labrum lesion of left shoulder, subsequent encounter: Secondary | ICD-10-CM | POA: Diagnosis not present

## 2022-06-02 DIAGNOSIS — M75102 Unspecified rotator cuff tear or rupture of left shoulder, not specified as traumatic: Secondary | ICD-10-CM | POA: Diagnosis not present

## 2022-06-02 DIAGNOSIS — M25511 Pain in right shoulder: Secondary | ICD-10-CM | POA: Diagnosis not present

## 2022-06-03 ENCOUNTER — Ambulatory Visit
Admit: 2022-06-03 | Discharge: 2022-06-04 | Payer: PRIVATE HEALTH INSURANCE | Attending: Critical Care Medicine | Primary: Critical Care Medicine

## 2022-06-03 ENCOUNTER — Encounter: Admit: 2022-06-03 | Discharge: 2022-06-04 | Payer: PRIVATE HEALTH INSURANCE

## 2022-06-03 ENCOUNTER — Other Ambulatory Visit: Admit: 2022-06-03 | Discharge: 2022-06-04 | Payer: PRIVATE HEALTH INSURANCE

## 2022-06-03 DIAGNOSIS — H04123 Dry eye syndrome of bilateral lacrimal glands: Principal | ICD-10-CM

## 2022-06-03 DIAGNOSIS — T451X5A Adverse effect of antineoplastic and immunosuppressive drugs, initial encounter: Principal | ICD-10-CM

## 2022-06-03 DIAGNOSIS — D474 Osteomyelofibrosis: Principal | ICD-10-CM

## 2022-06-03 DIAGNOSIS — D473 Essential (hemorrhagic) thrombocythemia: Principal | ICD-10-CM

## 2022-06-03 DIAGNOSIS — Z9484 Stem cells transplant status: Principal | ICD-10-CM

## 2022-06-03 DIAGNOSIS — Z9189 Other specified personal risk factors, not elsewhere classified: Principal | ICD-10-CM

## 2022-06-03 DIAGNOSIS — D61818 Other pancytopenia: Principal | ICD-10-CM

## 2022-06-03 DIAGNOSIS — N898 Other specified noninflammatory disorders of vagina: Principal | ICD-10-CM

## 2022-06-03 DIAGNOSIS — G620 Drug-induced polyneuropathy: Principal | ICD-10-CM

## 2022-06-03 DIAGNOSIS — D599 Acquired hemolytic anemia, unspecified: Principal | ICD-10-CM

## 2022-06-03 DIAGNOSIS — Z888 Allergy status to other drugs, medicaments and biological substances status: Secondary | ICD-10-CM | POA: Diagnosis not present

## 2022-06-03 DIAGNOSIS — Z881 Allergy status to other antibiotic agents status: Secondary | ICD-10-CM | POA: Diagnosis not present

## 2022-06-03 MED ORDER — TACROLIMUS 0.5 MG CAPSULE, IMMEDIATE-RELEASE
ORAL_CAPSULE | ORAL | 1 refills | 7 days | Status: CP
Start: 2022-06-03 — End: 2022-06-03

## 2022-06-03 MED ORDER — GABAPENTIN 100 MG CAPSULE
ORAL_CAPSULE | Freq: Every evening | ORAL | 5 refills | 30 days | Status: CP
Start: 2022-06-03 — End: 2022-11-30

## 2022-06-04 DIAGNOSIS — D473 Essential (hemorrhagic) thrombocythemia: Principal | ICD-10-CM

## 2022-06-04 DIAGNOSIS — D474 Osteomyelofibrosis: Principal | ICD-10-CM

## 2022-06-04 DIAGNOSIS — D7581 Myelofibrosis: Secondary | ICD-10-CM | POA: Diagnosis not present

## 2022-06-04 DIAGNOSIS — N941 Unspecified dyspareunia: Secondary | ICD-10-CM | POA: Diagnosis not present

## 2022-06-04 DIAGNOSIS — N951 Menopausal and female climacteric states: Secondary | ICD-10-CM | POA: Diagnosis not present

## 2022-06-04 DIAGNOSIS — N952 Postmenopausal atrophic vaginitis: Secondary | ICD-10-CM | POA: Diagnosis not present

## 2022-06-04 MED ORDER — TACROLIMUS 0.5 MG CAPSULE, IMMEDIATE-RELEASE
ORAL_CAPSULE | ORAL | 0 refills | 30 days | Status: CP
Start: 2022-06-04 — End: 2022-07-04

## 2022-06-09 DIAGNOSIS — M25511 Pain in right shoulder: Secondary | ICD-10-CM | POA: Diagnosis not present

## 2022-06-10 ENCOUNTER — Encounter: Admit: 2022-06-10 | Discharge: 2022-06-10 | Payer: PRIVATE HEALTH INSURANCE

## 2022-06-10 ENCOUNTER — Ambulatory Visit
Admit: 2022-06-10 | Discharge: 2022-06-10 | Payer: PRIVATE HEALTH INSURANCE | Attending: Student in an Organized Health Care Education/Training Program | Primary: Student in an Organized Health Care Education/Training Program

## 2022-06-10 ENCOUNTER — Ambulatory Visit: Admit: 2022-06-10 | Discharge: 2022-06-10 | Payer: PRIVATE HEALTH INSURANCE

## 2022-06-10 ENCOUNTER — Other Ambulatory Visit: Admit: 2022-06-10 | Discharge: 2022-06-10 | Payer: PRIVATE HEALTH INSURANCE

## 2022-06-10 DIAGNOSIS — Z79899 Other long term (current) drug therapy: Secondary | ICD-10-CM | POA: Diagnosis not present

## 2022-06-10 DIAGNOSIS — I10 Essential (primary) hypertension: Secondary | ICD-10-CM | POA: Diagnosis not present

## 2022-06-10 DIAGNOSIS — D84822 Immunodeficiency due to external causes: Secondary | ICD-10-CM | POA: Diagnosis not present

## 2022-06-10 DIAGNOSIS — Z885 Allergy status to narcotic agent status: Secondary | ICD-10-CM | POA: Diagnosis not present

## 2022-06-10 DIAGNOSIS — Z48298 Encounter for aftercare following other organ transplant: Secondary | ICD-10-CM | POA: Diagnosis not present

## 2022-06-10 DIAGNOSIS — Z9484 Stem cells transplant status: Secondary | ICD-10-CM | POA: Diagnosis not present

## 2022-06-10 DIAGNOSIS — D473 Essential (hemorrhagic) thrombocythemia: Secondary | ICD-10-CM | POA: Diagnosis not present

## 2022-06-10 DIAGNOSIS — D599 Acquired hemolytic anemia, unspecified: Secondary | ICD-10-CM | POA: Diagnosis not present

## 2022-06-10 DIAGNOSIS — D7581 Myelofibrosis: Secondary | ICD-10-CM | POA: Diagnosis not present

## 2022-06-10 DIAGNOSIS — D61818 Other pancytopenia: Secondary | ICD-10-CM | POA: Diagnosis not present

## 2022-06-10 DIAGNOSIS — D474 Osteomyelofibrosis: Secondary | ICD-10-CM | POA: Diagnosis not present

## 2022-06-10 DIAGNOSIS — Z9189 Other specified personal risk factors, not elsewhere classified: Secondary | ICD-10-CM | POA: Diagnosis not present

## 2022-06-11 DIAGNOSIS — D473 Essential (hemorrhagic) thrombocythemia: Secondary | ICD-10-CM | POA: Diagnosis not present

## 2022-06-11 DIAGNOSIS — D474 Osteomyelofibrosis: Secondary | ICD-10-CM | POA: Diagnosis not present

## 2022-06-16 DIAGNOSIS — D474 Osteomyelofibrosis: Principal | ICD-10-CM

## 2022-06-16 DIAGNOSIS — D473 Essential (hemorrhagic) thrombocythemia: Principal | ICD-10-CM

## 2022-06-16 DIAGNOSIS — M25511 Pain in right shoulder: Secondary | ICD-10-CM | POA: Diagnosis not present

## 2022-06-16 MED ORDER — TACROLIMUS 0.5 MG CAPSULE, IMMEDIATE-RELEASE
ORAL_CAPSULE | ORAL | 1 refills | 30 days | Status: CP
Start: 2022-06-16 — End: 2022-07-16

## 2022-06-17 ENCOUNTER — Other Ambulatory Visit: Admit: 2022-06-17 | Discharge: 2022-06-18 | Payer: PRIVATE HEALTH INSURANCE

## 2022-06-17 ENCOUNTER — Encounter: Admit: 2022-06-17 | Discharge: 2022-06-18 | Payer: PRIVATE HEALTH INSURANCE

## 2022-06-17 ENCOUNTER — Ambulatory Visit: Admit: 2022-06-17 | Discharge: 2022-06-18 | Payer: PRIVATE HEALTH INSURANCE

## 2022-06-17 DIAGNOSIS — Z9484 Stem cells transplant status: Principal | ICD-10-CM

## 2022-06-17 DIAGNOSIS — D84822 Immunocompromised state associated with stem cell transplant (CMS-HCC): Principal | ICD-10-CM

## 2022-06-17 DIAGNOSIS — D473 Essential (hemorrhagic) thrombocythemia: Principal | ICD-10-CM

## 2022-06-17 DIAGNOSIS — D474 Osteomyelofibrosis: Principal | ICD-10-CM

## 2022-06-17 DIAGNOSIS — Z5181 Encounter for therapeutic drug level monitoring: Principal | ICD-10-CM

## 2022-06-17 DIAGNOSIS — D599 Acquired hemolytic anemia, unspecified: Principal | ICD-10-CM

## 2022-06-17 DIAGNOSIS — Z9481 Bone marrow transplant status: Principal | ICD-10-CM

## 2022-06-17 DIAGNOSIS — Z0283 Encounter for blood-alcohol and blood-drug test: Secondary | ICD-10-CM | POA: Diagnosis not present

## 2022-06-17 MED ORDER — TACROLIMUS 0.5 MG CAPSULE, IMMEDIATE-RELEASE
ORAL_CAPSULE | Freq: Two times a day (BID) | ORAL | 1 refills | 30 days | Status: CP
Start: 2022-06-17 — End: ?

## 2022-06-18 MED ORDER — MAGNESIUM OXIDE-MAGNESIUM AMINO ACID CHELATE 133 MG TABLET
ORAL_TABLET | Freq: Two times a day (BID) | ORAL | 2 refills | 30 days | Status: CP
Start: 2022-06-18 — End: ?
  Filled 2022-06-19: qty 180, 30d supply, fill #0

## 2022-06-22 ENCOUNTER — Other Ambulatory Visit: Payer: Self-pay

## 2022-06-22 ENCOUNTER — Emergency Department (HOSPITAL_COMMUNITY): Payer: 59

## 2022-06-22 ENCOUNTER — Encounter (HOSPITAL_COMMUNITY): Payer: Self-pay

## 2022-06-22 ENCOUNTER — Emergency Department (HOSPITAL_COMMUNITY)
Admission: EM | Admit: 2022-06-22 | Discharge: 2022-06-23 | Disposition: A | Discharge status: Other Acute Inpatient Hospital | Payer: 59 | Attending: Emergency Medicine | Admitting: Emergency Medicine

## 2022-06-22 ENCOUNTER — Other Ambulatory Visit: Admit: 2022-06-22 | Discharge: 2022-06-22 | Payer: PRIVATE HEALTH INSURANCE

## 2022-06-22 ENCOUNTER — Ambulatory Visit
Admit: 2022-06-22 | Discharge: 2022-06-22 | Payer: PRIVATE HEALTH INSURANCE | Attending: Critical Care Medicine | Primary: Critical Care Medicine

## 2022-06-22 DIAGNOSIS — R61 Generalized hyperhidrosis: Secondary | ICD-10-CM | POA: Diagnosis not present

## 2022-06-22 DIAGNOSIS — D473 Essential (hemorrhagic) thrombocythemia: Secondary | ICD-10-CM | POA: Diagnosis not present

## 2022-06-22 DIAGNOSIS — R5081 Fever presenting with conditions classified elsewhere: Secondary | ICD-10-CM | POA: Diagnosis not present

## 2022-06-22 DIAGNOSIS — R519 Headache, unspecified: Secondary | ICD-10-CM | POA: Diagnosis not present

## 2022-06-22 DIAGNOSIS — D709 Neutropenia, unspecified: Secondary | ICD-10-CM | POA: Diagnosis not present

## 2022-06-22 DIAGNOSIS — L299 Pruritus, unspecified: Secondary | ICD-10-CM | POA: Diagnosis not present

## 2022-06-22 DIAGNOSIS — D474 Osteomyelofibrosis: Secondary | ICD-10-CM | POA: Diagnosis not present

## 2022-06-22 DIAGNOSIS — Z20822 Contact with and (suspected) exposure to covid-19: Secondary | ICD-10-CM | POA: Diagnosis not present

## 2022-06-22 DIAGNOSIS — R110 Nausea: Secondary | ICD-10-CM | POA: Diagnosis not present

## 2022-06-22 DIAGNOSIS — Z9484 Stem cells transplant status: Secondary | ICD-10-CM | POA: Diagnosis not present

## 2022-06-22 DIAGNOSIS — R161 Splenomegaly, not elsewhere classified: Secondary | ICD-10-CM | POA: Diagnosis not present

## 2022-06-22 DIAGNOSIS — R5383 Other fatigue: Secondary | ICD-10-CM | POA: Diagnosis not present

## 2022-06-22 DIAGNOSIS — R509 Fever, unspecified: Secondary | ICD-10-CM | POA: Diagnosis not present

## 2022-06-22 DIAGNOSIS — E611 Iron deficiency: Secondary | ICD-10-CM | POA: Diagnosis not present

## 2022-06-22 DIAGNOSIS — M898X9 Other specified disorders of bone, unspecified site: Secondary | ICD-10-CM | POA: Diagnosis not present

## 2022-06-22 DIAGNOSIS — R112 Nausea with vomiting, unspecified: Secondary | ICD-10-CM | POA: Diagnosis not present

## 2022-06-22 DIAGNOSIS — D61818 Other pancytopenia: Secondary | ICD-10-CM | POA: Diagnosis not present

## 2022-06-22 DIAGNOSIS — Z9189 Other specified personal risk factors, not elsewhere classified: Secondary | ICD-10-CM | POA: Diagnosis not present

## 2022-06-22 LAB — CBC WITH DIFFERENTIAL/PLATELET
Abs Immature Granulocytes: 0.01 10*3/uL (ref 0.00–0.07)
Basophils Absolute: 0 10*3/uL (ref 0.0–0.1)
Basophils Relative: 0 %
Eosinophils Absolute: 0 10*3/uL (ref 0.0–0.5)
Eosinophils Relative: 0 %
HCT: 25 % — ABNORMAL LOW (ref 36.0–46.0)
Hemoglobin: 8.2 g/dL — ABNORMAL LOW (ref 12.0–15.0)
Immature Granulocytes: 2 %
Lymphocytes Relative: 36 %
Lymphs Abs: 0.2 10*3/uL — ABNORMAL LOW (ref 0.7–4.0)
MCH: 31.7 pg (ref 26.0–34.0)
MCHC: 32.8 g/dL (ref 30.0–36.0)
MCV: 96.5 fL (ref 80.0–100.0)
Monocytes Absolute: 0.1 10*3/uL (ref 0.1–1.0)
Monocytes Relative: 14 %
Neutro Abs: 0.2 10*3/uL — CL (ref 1.7–7.7)
Neutrophils Relative %: 48 %
Platelets: 36 10*3/uL — ABNORMAL LOW (ref 150–400)
RBC: 2.59 MIL/uL — ABNORMAL LOW (ref 3.87–5.11)
RDW: 17.2 % — ABNORMAL HIGH (ref 11.5–15.5)
WBC: 0.5 10*3/uL — CL (ref 4.0–10.5)
nRBC: 0 % (ref 0.0–0.2)

## 2022-06-22 LAB — COMPREHENSIVE METABOLIC PANEL
ALT: 113 U/L — ABNORMAL HIGH (ref 0–44)
AST: 83 U/L — ABNORMAL HIGH (ref 15–41)
Albumin: 3.8 g/dL (ref 3.5–5.0)
Alkaline Phosphatase: 73 U/L (ref 38–126)
Anion gap: 10 (ref 5–15)
BUN: 12 mg/dL (ref 6–20)
CO2: 21 mmol/L — ABNORMAL LOW (ref 22–32)
Calcium: 8.3 mg/dL — ABNORMAL LOW (ref 8.9–10.3)
Chloride: 103 mmol/L (ref 98–111)
Creatinine, Ser: 0.67 mg/dL (ref 0.44–1.00)
GFR, Estimated: >60 mL/min (ref >60)
Glucose, Bld: 141 mg/dL — ABNORMAL HIGH (ref 70–99)
Potassium: 3.5 mmol/L (ref 3.5–5.1)
Sodium: 134 mmol/L — ABNORMAL LOW (ref 135–145)
Total Bilirubin: 1.1 mg/dL (ref 0.3–1.2)
Total Protein: 6.6 g/dL (ref 6.5–8.1)

## 2022-06-22 LAB — URINALYSIS, ROUTINE W REFLEX MICROSCOPIC
Bilirubin Urine: NEGATIVE
Glucose, UA: NEGATIVE mg/dL
Hgb urine dipstick: NEGATIVE
Ketones, ur: 5 mg/dL — AB
Leukocytes,Ua: NEGATIVE
Nitrite: NEGATIVE
Protein, ur: NEGATIVE mg/dL
Specific Gravity, Urine: 1.014 (ref 1.005–1.030)
pH: 5 (ref 5.0–8.0)

## 2022-06-22 LAB — LACTIC ACID, PLASMA: Lactic Acid, Venous: 0.8 mmol/L (ref 0.5–1.9)

## 2022-06-22 MED ORDER — SODIUM CHLORIDE 0.9 % IV SOLN: Freq: Once | INTRAVENOUS | Status: AC

## 2022-06-22 MED ORDER — KETOROLAC TROMETHAMINE 15 MG/ML IJ SOLN
15.0000 mg | Freq: Once | INTRAMUSCULAR | Status: DC
  Filled 2022-06-22: qty 1

## 2022-06-22 MED ORDER — SODIUM CHLORIDE 0.9 % IV SOLN
2.0000 g | Freq: Once | INTRAVENOUS | Status: AC
  Administered 2022-06-22: 2 g via INTRAVENOUS
  Filled 2022-06-22: qty 12.5

## 2022-06-22 MED ORDER — PROCHLORPERAZINE EDISYLATE 10 MG/2ML IJ SOLN
10.0000 mg | Freq: Once | INTRAMUSCULAR | Status: AC
  Administered 2022-06-23: 10 mg via INTRAVENOUS
  Filled 2022-06-22: qty 2

## 2022-06-22 MED ORDER — DOXYCYCLINE HYCLATE 100 MG PO TABS
100.0000 mg | ORAL_TABLET | Freq: Once | ORAL | Status: DC
  Administered 2022-06-22: 100 mg via ORAL
  Filled 2022-06-22: qty 1

## 2022-06-22 MED ORDER — ACETAMINOPHEN 500 MG PO TABS
1000.0000 mg | ORAL_TABLET | Freq: Once | ORAL | Status: AC
  Administered 2022-06-22: 1000 mg via ORAL
  Filled 2022-06-22: qty 2

## 2022-06-22 MED ORDER — PANTOPRAZOLE 40 MG TABLET,DELAYED RELEASE
ORAL_TABLET | Freq: Two times a day (BID) | ORAL | 6 refills | 15 days | Status: CP
Start: 2022-06-22 — End: ?

## 2022-06-22 MED ORDER — LEVOFLOXACIN 750 MG TABLET
ORAL_TABLET | Freq: Every day | ORAL | 0 refills | 30 days | Status: CP
Start: 2022-06-22 — End: 2022-06-22

## 2022-06-22 MED ORDER — FLUCONAZOLE 200 MG TABLET
ORAL_TABLET | Freq: Every day | ORAL | 0 refills | 15 days | Status: CP
Start: 2022-06-22 — End: 2022-07-22

## 2022-06-22 MED ORDER — CEFDINIR 300 MG CAPSULE
ORAL_CAPSULE | Freq: Every day | ORAL | 0 refills | 30 days | Status: CP
Start: 2022-06-22 — End: 2022-06-22

## 2022-06-22 NOTE — ED Provider Notes (Signed)
[place] EMERGENCY DEPARTMENT AT [facility] Provider Note   CSN: [id number] Arrival date & time: 06/22/22  1812     History  Chief Complaint  Patient presents with   CA Pt   Fever   Headache   Nausea    Lynn Mccarthy is a 52 y.o. female.  HPI Patient presents after being seen earlier in the day at [facility] where she is currently a patient due to history of myeloproliferative disease. History is notable for similar recent stem cell transplant.  She notes that she has been compliant with her medications, has been doing generally well, had recent change in her tacrolimus dosing.  With past 2 or 4 she had a mild fever, went to [facility].  She was seen, evaluated, had labs, was discharged.  After arriving home she developed a fever again, and after speaking with oncology staff at that facility was sent to the ED for evaluation.  In the interval she took 1 dose of cefdinir which was prescribed on discharge. No cough, chest pain, congestion, skin changes, urinary changes.  She is accompanied by her sister who assists with the history.    Home Medications Prior to Admission medications   Medication Sig Start Date End Date Taking? Authorizing Provider  Ferrous Fumarate (HEMOCYTE PO) Take 1 tablet by mouth daily. Reported on 07/22/2015    [provider]  fluticasone (FLONASE) 50 MCG/ACT nasal spray Place 1 spray into both nostrils daily.    [provider]  gabapentin (NEURONTIN) 300 MG capsule Take 300 mg by mouth 2 (two) times daily. 09/04/20   [provider]  JAKAFI 15 MG tablet Take 15 mg by mouth 2 (two) times daily. 08/13/20   [provider]  Multiple Vitamin (MULTIVITAMIN) tablet Take 1 tablet by mouth daily.    [provider]  Multiple Vitamins-Minerals (IMMUNE SYSTEM BOOSTER PO) Take 3 tablets by mouth daily.    [provider]  omeprazole (PRILOSEC) 20 MG capsule Take 20 mg by mouth daily.    [provider]  prochlorperazine (COMPAZINE) 5 MG tablet Take 5 mg by mouth as needed. 07/08/20   [provider]  ruxolitinib phosphate (JAKAFI) 20 MG tablet Take 20 mg by mouth 2 (two) times daily. 10/26/19   [provider]  sertraline (ZOLOFT) 50 MG tablet Take 50 mg by mouth daily. 05/10/19   [provider]  traMADol (ULTRAM) 50 MG tablet Take 0.5-1 tablets (25-50 mg total) by mouth every 8 (eight) hours as needed for moderate pain. Do not drive while taking this medication. 09/28/19   Rana Snare, NP      Allergies    Doxycycline, Paxil [paroxetine hcl], and Levaquin [levofloxacin]    Review of Systems   Review of Systems  All other systems reviewed and are negative.   Physical Exam Updated Vital Signs BP 110/71   Pulse 94   Temp 99.3 F (37.4 C) (Oral)   Resp (!) 28   Ht 5' 5.5" (1.664 m)   Wt 64.7 kg   SpO2 96%   BMI 23.39 kg/m  Physical Exam Vitals and nursing note reviewed.  Constitutional:      General: She is not in acute distress.    Appearance: She is well-developed.  HENT:     Head: Normocephalic and atraumatic.  Eyes:     Conjunctiva/sclera: Conjunctivae normal.  Cardiovascular:     Rate and Rhythm: Normal rate and regular rhythm.  Pulmonary:  Effort: Pulmonary effort is normal. No respiratory distress.     Breath sounds: Normal breath sounds. No stridor.  Abdominal:     General: There is no distension.  Skin:    General: Skin is warm and dry.  Neurological:     Mental Status: She is alert and oriented to person, place, and time.     Cranial Nerves: No cranial nerve deficit.  Psychiatric:        Mood and Affect: Mood normal.     ED Results / Procedures / Treatments   Labs (all labs ordered are listed, but only abnormal results are displayed) Labs Reviewed  COMPREHENSIVE METABOLIC PANEL - Abnormal; Notable for the following components:      Result Value   Sodium 134 (*)    CO2 21 (*)    Glucose, Bld 141 (*)     Calcium 8.3 (*)    AST 83 (*)    ALT 113 (*)    All other components within normal limits  CBC WITH DIFFERENTIAL/PLATELET - Abnormal; Notable for the following components:   WBC 0.5 (*)    RBC 2.59 (*)    Hemoglobin 8.2 (*)    HCT 25.0 (*)    RDW 17.2 (*)    Platelets 36 (*)    Neutro Abs 0.2 (*)    Lymphs Abs 0.2 (*)    All other components within normal limits  URINALYSIS, ROUTINE W REFLEX MICROSCOPIC - Abnormal; Notable for the following components:   Ketones, ur 5 (*)    All other components within normal limits  CULTURE, BLOOD (ROUTINE X 2)  CULTURE, BLOOD (ROUTINE X 2)  URINE CULTURE  LACTIC ACID, PLASMA  LACTIC ACID, PLASMA    EKG None  Radiology DG Chest 2 View  Result Date: 06/22/2022 CLINICAL DATA:  Fever EXAM: CHEST - 2 VIEW COMPARISON:  None Available. FINDINGS: The heart size and mediastinal contours are within normal limits. Both lungs are clear. The visualized skeletal structures are unremarkable. IMPRESSION: Lungs are clear. Electronically Signed   By: Allegra Lai M.D.   On: 06/22/2022 20:06    Procedures Procedures    Medications Ordered in ED Medications  ketorolac (TORADOL) 15 MG/ML injection 15 mg (0 mg Intravenous Hold 06/22/22 2148)  acetaminophen (TYLENOL) tablet 1,000 mg (1,000 mg Oral Given 06/22/22 2012)  0.9 %  sodium chloride infusion ( Intravenous New Bag/Given 06/22/22 2059)    ED Course/ Medical Decision Making/ A&P                             Medical Decision Making Patient with myeloproliferative disease now status post stem cell transplant with immunosuppressants presents with fever.  Concern for neutropenic fever.  Patient is mildly febrile, mildly tachycardic, both of these improved with Tylenol and fluids respectively. Patient has no obvious source of infection and I reviewed her chart from [facility] earlier today including negative respiratory panel, unremarkable urinalysis.  She did have new leukopenia at that point.  Labs repeated  here, and after those were available I discussed her case with the fellow at [facility].  Though the patient has no obvious source of infection and is in no distress, fever has resolved, we discussed options for additional therapy and patient will start cefepime, be transferred to [facility], where she has already been accepted to a bed for further monitoring and management.   Amount and/or Complexity of Data Reviewed Independent Historian:     Details:  Sister at bedside External Data Reviewed: notes.    Details: I reviewed the patient's [facility] evaluation, labs from earlier today discussed with the fellow that facility Labs: ordered. Decision-making details documented in ED Course. Radiology: ordered and independent interpretation performed. Decision-making details documented in ED Course.    Details: X-ray unremarkable  Risk OTC drugs. Prescription drug management. Decision regarding hospitalization.   CRITICAL CARE Performed by: Gerhard Munch Total critical care time: 35 minutes Critical care time was exclusive of separately billable procedures and treating other patients. Critical care was necessary to treat or prevent imminent or life-threatening deterioration. Critical care was time spent personally by me on the following activities: development of treatment plan with patient and/or surrogate as well as nursing, discussions with consultants, evaluation of patient's response to treatment, examination of patient, obtaining history from patient or surrogate, ordering and performing treatments and interventions, ordering and review of laboratory studies, ordering and review of radiographic studies, pulse oximetry and re-evaluation of patient's condition.  Final Clinical Impression(s) / ED Diagnoses Final diagnoses:  Neutropenic fever (HCC)     Gerhard Munch, MD 06/22/22 2344

## 2022-06-22 NOTE — ED Notes (Addendum)
Transfer center [phone]  Providers from [facility] are calling saying its an emergency to get this pt to [facility] stat.

## 2022-06-22 NOTE — ED Notes (Signed)
Pt ambulated to restroom and to pt room without assistance and with steady gait.

## 2022-06-22 NOTE — ED Triage Notes (Signed)
Pt c/o headache, nausea, and chills x2 days and fever x1 day.  Pain score 4/10.  Pt is neutropenic from a stem cell transplant on 02/17/22.

## 2022-06-22 NOTE — ED Provider Triage Note (Signed)
Emergency Medicine Provider Triage Evaluation Note  Lynn Mccarthy , a 52 y.o. female  was evaluated in triage.  Pt complains of.  Review of Systems  Positive:  Negative:   Physical Exam  BP 131/87 (BP Location: Right Arm)   Pulse (!) 132   Temp (!) 101.1 F (38.4 C) (Oral)   Resp 16   Ht 5' 5.5" (1.664 m)   Wt 64.7 kg   SpO2 98%   BMI 23.39 kg/m  Gen:   Awake, no distress   Resp:  Normal effort  MSK:   Moves extremities without difficulty  Other:    Medical Decision Making  Medically screening exam initiated at 7:13 PM.  Appropriate orders placed.  Lynn Mccarthy was informed that the remainder of the evaluation will be completed by another provider, this initial triage assessment does not replace that evaluation, and the importance of remaining in the ED until their evaluation is complete.  Patient complaining of HA, nausea, body aches x2 days. Endorses fever of 100.2 last night. Patient took one dose of oral Cefdinir prescribed by provider yesterday. Also taking fluconazole starting today. Told to come to ED if fever got higher than 100.3.  Denies chest pain, dyspnea, abdominal pain, vomit, diarrhea, dysuria, hematuria.  Obtaining CMP, CBC, blood cultures, LA   Lynn Mccarthy, [place] 06/22/22 1922

## 2022-06-23 ENCOUNTER — Ambulatory Visit
Admit: 2022-06-23 | Discharge: 2022-06-25 | Disposition: A | Discharge status: Home / Self Care | Payer: PRIVATE HEALTH INSURANCE | Source: Other Acute Inpatient Hospital

## 2022-06-23 ENCOUNTER — Encounter
Admit: 2022-06-23 | Discharge: 2022-06-25 | Disposition: A | Discharge status: Home / Self Care | Payer: PRIVATE HEALTH INSURANCE | Source: Other Acute Inpatient Hospital | Attending: Certified Registered"

## 2022-06-23 ENCOUNTER — Encounter
Admit: 2022-06-23 | Discharge: 2022-06-25 | Disposition: A | Discharge status: Home / Self Care | Payer: PRIVATE HEALTH INSURANCE | Source: Other Acute Inpatient Hospital

## 2022-06-23 DIAGNOSIS — R197 Diarrhea, unspecified: Secondary | ICD-10-CM | POA: Diagnosis not present

## 2022-06-23 DIAGNOSIS — D849 Immunodeficiency, unspecified: Secondary | ICD-10-CM | POA: Diagnosis not present

## 2022-06-23 DIAGNOSIS — D89813 Graft-versus-host disease, unspecified: Secondary | ICD-10-CM | POA: Diagnosis not present

## 2022-06-23 DIAGNOSIS — K219 Gastro-esophageal reflux disease without esophagitis: Secondary | ICD-10-CM | POA: Diagnosis not present

## 2022-06-23 DIAGNOSIS — D709 Neutropenia, unspecified: Secondary | ICD-10-CM | POA: Diagnosis not present

## 2022-06-23 DIAGNOSIS — R509 Fever, unspecified: Secondary | ICD-10-CM | POA: Diagnosis not present

## 2022-06-23 DIAGNOSIS — R7401 Elevation of levels of liver transaminase levels: Secondary | ICD-10-CM | POA: Diagnosis not present

## 2022-06-23 DIAGNOSIS — B370 Candidal stomatitis: Secondary | ICD-10-CM | POA: Diagnosis not present

## 2022-06-23 DIAGNOSIS — K1230 Oral mucositis (ulcerative), unspecified: Secondary | ICD-10-CM | POA: Diagnosis not present

## 2022-06-23 DIAGNOSIS — D61818 Other pancytopenia: Secondary | ICD-10-CM | POA: Diagnosis not present

## 2022-06-23 DIAGNOSIS — D473 Essential (hemorrhagic) thrombocythemia: Secondary | ICD-10-CM | POA: Diagnosis not present

## 2022-06-23 DIAGNOSIS — R112 Nausea with vomiting, unspecified: Secondary | ICD-10-CM | POA: Diagnosis not present

## 2022-06-23 DIAGNOSIS — K269 Duodenal ulcer, unspecified as acute or chronic, without hemorrhage or perforation: Secondary | ICD-10-CM | POA: Diagnosis not present

## 2022-06-23 DIAGNOSIS — R5081 Fever presenting with conditions classified elsewhere: Secondary | ICD-10-CM | POA: Diagnosis not present

## 2022-06-23 DIAGNOSIS — D7589 Other specified diseases of blood and blood-forming organs: Secondary | ICD-10-CM | POA: Diagnosis not present

## 2022-06-23 DIAGNOSIS — Z9484 Stem cells transplant status: Secondary | ICD-10-CM | POA: Diagnosis not present

## 2022-06-23 DIAGNOSIS — R519 Headache, unspecified: Secondary | ICD-10-CM | POA: Diagnosis not present

## 2022-06-23 DIAGNOSIS — K2970 Gastritis, unspecified, without bleeding: Secondary | ICD-10-CM | POA: Diagnosis not present

## 2022-06-23 DIAGNOSIS — A770 Spotted fever due to Rickettsia rickettsii: Secondary | ICD-10-CM | POA: Diagnosis not present

## 2022-06-23 DIAGNOSIS — D7581 Myelofibrosis: Secondary | ICD-10-CM | POA: Diagnosis not present

## 2022-06-23 DIAGNOSIS — D696 Thrombocytopenia, unspecified: Secondary | ICD-10-CM | POA: Diagnosis not present

## 2022-06-23 LAB — URINE CULTURE: Culture: NO GROWTH

## 2022-06-23 MED ORDER — SODIUM CHLORIDE 0.9 % IV SOLN: 2.0000 g | Freq: Three times a day (TID) | INTRAVENOUS | Status: DC

## 2022-06-23 NOTE — Progress Notes (Signed)
Pharmacy Antibiotic Note  Lynn Mccarthy is a 52 y.o. female admitted on 06/22/2022 with myeloproliferative disease now status post stem cell transplant with immunosuppressants presents with fever. .  Pharmacy has been consulted to dose cefepime for neutropenic fever.  Plan: Cefepime 2gm IV q8h Follow renal function, cultures and clinical course  Height: 5' 5.5" (166.4 cm) Weight: 64.7 kg (142 lb 11.2 oz) IBW/kg (Calculated) : 58.15  Temp (24hrs), Avg:100.2 F (37.9 C), Min:99.3 F (37.4 C), Max:101.1 F (38.4 C)  Recent Labs  Lab 06/22/22 2034 06/22/22 2055  WBC 0.5*  --   CREATININE 0.67  --   LATICACIDVEN  --  0.8    Estimated Creatinine Clearance: 78.2 mL/min (by C-G formula based on SCr of 0.67 mg/dL).    Allergies  Allergen Reactions   Doxycycline Nausea Only    Stomach cramps   Paxil [Paroxetine Hcl] Other (See Comments)    Made her dazed   Levaquin [Levofloxacin] Rash    Antimicrobials this admission: 6/10 cefepime >>  Dose adjustments this admission:   Microbiology results: 6/10 BCx:  6/10 UCx:   Thank you for allowing pharmacy to be a part of this patient's care.  Arley Phenix RPh 06/23/2022, 1:19 AM

## 2022-06-23 NOTE — ED Notes (Signed)
Informed by Shawna Orleans Transport RN with [facility] at this time

## 2022-06-25 MED ORDER — FULPHILA 6 MG/0.6 ML SUBCUTANEOUS SYRINGE
Freq: Every day | SUBCUTANEOUS | 0 refills | 1 days
Start: 2022-06-25 — End: 2022-06-25

## 2022-06-25 MED ORDER — GRANIX 300 MCG/0.5 ML SUBCUTANEOUS SYRINGE
Freq: Every day | SUBCUTANEOUS | 0 refills | 7 days
Start: 2022-06-25 — End: 2022-06-25

## 2022-06-25 MED ORDER — DOXYCYCLINE HYCLATE 100 MG TABLET
ORAL_TABLET | Freq: Two times a day (BID) | ORAL | 0 refills | 7 days | Status: CP
Start: 2022-06-25 — End: ?
  Filled 2022-06-25: qty 14, 7d supply, fill #0

## 2022-06-25 MED ORDER — LORAZEPAM 0.5 MG TABLET
ORAL_TABLET | Freq: Four times a day (QID) | ORAL | 0 refills | 8 days | Status: CP | PRN
Start: 2022-06-25 — End: ?
  Filled 2022-06-25: qty 30, 8d supply, fill #0

## 2022-06-25 MED ORDER — NEUPOGEN 300 MCG/ML INJECTION SOLUTION
Freq: Every day | SUBCUTANEOUS | 0 refills | 7 days
Start: 2022-06-25 — End: 2022-06-25

## 2022-06-25 MED ORDER — UDENYCA 6 MG/0.6 ML SUBCUTANEOUS SYRINGE
Freq: Every day | SUBCUTANEOUS | 0 refills | 1 days
Start: 2022-06-25 — End: 2022-06-25

## 2022-06-25 MED ORDER — PROCHLORPERAZINE MALEATE 10 MG TABLET
ORAL_TABLET | Freq: Four times a day (QID) | ORAL | 2 refills | 8 days | Status: CP | PRN
Start: 2022-06-25 — End: ?

## 2022-06-25 MED ORDER — RELEUKO 300 MCG/ML INJECTION SOLUTION
Freq: Every day | SUBCUTANEOUS | 0 refills | 7 days
Start: 2022-06-25 — End: 2022-06-25

## 2022-06-25 MED ORDER — ONDANSETRON HCL 8 MG TABLET
ORAL_TABLET | Freq: Three times a day (TID) | ORAL | 2 refills | 10 days | Status: CP | PRN
Start: 2022-06-25 — End: ?

## 2022-06-25 MED ORDER — FAMOTIDINE 20 MG TABLET
ORAL_TABLET | Freq: Two times a day (BID) | ORAL | 3 refills | 30 days | Status: CP
Start: 2022-06-25 — End: ?

## 2022-06-25 MED ORDER — NIVESTYM 300 MCG/0.5 ML SUBCUTANEOUS SYRINGE
Freq: Every day | SUBCUTANEOUS | 0 refills | 7 days
Start: 2022-06-25 — End: 2022-06-25

## 2022-06-25 MED ORDER — OLANZAPINE 2.5 MG TABLET
ORAL_TABLET | Freq: Two times a day (BID) | ORAL | 0 refills | 30 days | Status: CP
Start: 2022-06-25 — End: ?
  Filled 2022-06-25: qty 60, 30d supply, fill #0

## 2022-06-25 MED ORDER — ZARXIO 300 MCG/0.5 ML INJECTION SYRINGE
Freq: Every day | SUBCUTANEOUS | 0 refills | 7 days
Start: 2022-06-25 — End: 2022-06-25

## 2022-06-25 MED ORDER — NEULASTA 6 MG/0.6 ML SUBCUTANEOUS SYRINGE
Freq: Every day | SUBCUTANEOUS | 0 refills | 1 days
Start: 2022-06-25 — End: 2022-06-25

## 2022-06-25 MED ORDER — ZIEXTENZO 6 MG/0.6 ML SUBCUTANEOUS SYRINGE
Freq: Every day | SUBCUTANEOUS | 0 refills | 1 days
Start: 2022-06-25 — End: 2022-06-25

## 2022-06-25 MED ORDER — PREDNISONE 10 MG TABLET
ORAL_TABLET | ORAL | 0 refills | 7 days | Status: CP
Start: 2022-06-25 — End: 2022-07-02

## 2022-06-26 ENCOUNTER — Ambulatory Visit
Admit: 2022-06-26 | Discharge: 2022-06-26 | Payer: PRIVATE HEALTH INSURANCE | Attending: Critical Care Medicine | Primary: Critical Care Medicine

## 2022-06-26 ENCOUNTER — Ambulatory Visit
Admit: 2022-06-26 | Discharge: 2022-06-26 | Payer: PRIVATE HEALTH INSURANCE | Attending: Pharmacist Clinician (PhC)/ Clinical Pharmacy Specialist | Primary: Pharmacist Clinician (PhC)/ Clinical Pharmacy Specialist

## 2022-06-26 ENCOUNTER — Other Ambulatory Visit: Admit: 2022-06-26 | Discharge: 2022-06-26 | Payer: PRIVATE HEALTH INSURANCE

## 2022-06-26 DIAGNOSIS — D473 Essential (hemorrhagic) thrombocythemia: Secondary | ICD-10-CM | POA: Diagnosis not present

## 2022-06-26 DIAGNOSIS — D701 Agranulocytosis secondary to cancer chemotherapy: Secondary | ICD-10-CM | POA: Diagnosis not present

## 2022-06-26 DIAGNOSIS — Z9484 Stem cells transplant status: Secondary | ICD-10-CM | POA: Diagnosis not present

## 2022-06-26 DIAGNOSIS — D84822 Immunodeficiency due to external causes: Secondary | ICD-10-CM | POA: Diagnosis not present

## 2022-06-26 DIAGNOSIS — R7402 Elevation of levels of lactic acid dehydrogenase (LDH): Secondary | ICD-10-CM | POA: Diagnosis not present

## 2022-06-26 DIAGNOSIS — G8929 Other chronic pain: Secondary | ICD-10-CM | POA: Diagnosis not present

## 2022-06-26 DIAGNOSIS — E611 Iron deficiency: Secondary | ICD-10-CM | POA: Diagnosis not present

## 2022-06-26 DIAGNOSIS — D474 Osteomyelofibrosis: Secondary | ICD-10-CM | POA: Diagnosis not present

## 2022-06-26 DIAGNOSIS — R112 Nausea with vomiting, unspecified: Secondary | ICD-10-CM | POA: Diagnosis not present

## 2022-06-26 DIAGNOSIS — R161 Splenomegaly, not elsewhere classified: Secondary | ICD-10-CM | POA: Diagnosis not present

## 2022-06-26 DIAGNOSIS — R519 Headache, unspecified: Secondary | ICD-10-CM | POA: Diagnosis not present

## 2022-06-26 DIAGNOSIS — K219 Gastro-esophageal reflux disease without esophagitis: Secondary | ICD-10-CM | POA: Diagnosis not present

## 2022-06-26 DIAGNOSIS — Z09 Encounter for follow-up examination after completed treatment for conditions other than malignant neoplasm: Secondary | ICD-10-CM | POA: Diagnosis not present

## 2022-06-26 DIAGNOSIS — T451X5A Adverse effect of antineoplastic and immunosuppressive drugs, initial encounter: Secondary | ICD-10-CM | POA: Diagnosis not present

## 2022-06-26 DIAGNOSIS — M898X9 Other specified disorders of bone, unspecified site: Secondary | ICD-10-CM | POA: Diagnosis not present

## 2022-06-26 LAB — CULTURE, BLOOD (ROUTINE X 2): Culture: NO GROWTH

## 2022-06-26 MED ORDER — POSACONAZOLE 100 MG TABLET,DELAYED RELEASE
ORAL_TABLET | Freq: Every day | ORAL | 0 refills | 30.00000 days | Status: CP
Start: 2022-06-26 — End: 2022-06-26
  Filled 2022-06-26: qty 90, 29d supply, fill #0

## 2022-06-26 MED ORDER — PREDNISONE 10 MG TABLET
ORAL_TABLET | Freq: Two times a day (BID) | ORAL | 1 refills | 30 days | Status: CP
Start: 2022-06-26 — End: ?

## 2022-06-27 LAB — CULTURE, BLOOD (ROUTINE X 2)

## 2022-06-28 LAB — CULTURE, BLOOD (ROUTINE X 2)
Culture: NO GROWTH
Special Requests: ADEQUATE
Special Requests: ADEQUATE

## 2022-06-29 ENCOUNTER — Other Ambulatory Visit: Admit: 2022-06-29 | Discharge: 2022-06-29 | Payer: PRIVATE HEALTH INSURANCE

## 2022-06-29 ENCOUNTER — Ambulatory Visit
Admit: 2022-06-29 | Discharge: 2022-06-29 | Payer: PRIVATE HEALTH INSURANCE | Attending: Critical Care Medicine | Primary: Critical Care Medicine

## 2022-06-29 DIAGNOSIS — R112 Nausea with vomiting, unspecified: Principal | ICD-10-CM

## 2022-06-29 DIAGNOSIS — D61818 Other pancytopenia: Principal | ICD-10-CM

## 2022-06-29 DIAGNOSIS — Z9484 Stem cells transplant status: Principal | ICD-10-CM

## 2022-06-29 DIAGNOSIS — Z5181 Encounter for therapeutic drug level monitoring: Principal | ICD-10-CM

## 2022-06-29 DIAGNOSIS — D89813 Graft-versus-host disease, unspecified: Principal | ICD-10-CM

## 2022-06-29 DIAGNOSIS — D473 Essential (hemorrhagic) thrombocythemia: Principal | ICD-10-CM

## 2022-06-29 DIAGNOSIS — D474 Osteomyelofibrosis: Principal | ICD-10-CM

## 2022-06-29 MED ORDER — PREDNISONE 20 MG TABLET
ORAL_TABLET | Freq: Two times a day (BID) | ORAL | 1 refills | 30 days | Status: CP
Start: 2022-06-29 — End: ?

## 2022-06-29 MED ORDER — MAGNESIUM OXIDE-MAGNESIUM AMINO ACID CHELATE 133 MG TABLET
ORAL_TABLET | Freq: Every day | ORAL | 2 refills | 60 days | Status: CP
Start: 2022-06-29 — End: ?

## 2022-06-29 MED ORDER — POSACONAZOLE 100 MG TABLET,DELAYED RELEASE
ORAL_TABLET | 0 refills | 0.00000 days | Status: CN
Start: 2022-06-29 — End: ?

## 2022-06-30 ENCOUNTER — Other Ambulatory Visit: Admit: 2022-06-30 | Discharge: 2022-07-01 | Payer: PRIVATE HEALTH INSURANCE

## 2022-06-30 ENCOUNTER — Encounter
Admit: 2022-06-30 | Discharge: 2022-07-01 | Payer: PRIVATE HEALTH INSURANCE | Attending: Adult Health | Primary: Adult Health

## 2022-06-30 ENCOUNTER — Ambulatory Visit
Admit: 2022-06-30 | Discharge: 2022-07-01 | Payer: PRIVATE HEALTH INSURANCE | Attending: Critical Care Medicine | Primary: Critical Care Medicine

## 2022-06-30 DIAGNOSIS — D473 Essential (hemorrhagic) thrombocythemia: Principal | ICD-10-CM

## 2022-06-30 DIAGNOSIS — D474 Osteomyelofibrosis: Principal | ICD-10-CM

## 2022-06-30 DIAGNOSIS — Z9484 Stem cells transplant status: Principal | ICD-10-CM

## 2022-06-30 DIAGNOSIS — D89811 Chronic graft-versus-host disease: Secondary | ICD-10-CM | POA: Diagnosis not present

## 2022-06-30 DIAGNOSIS — K2970 Gastritis, unspecified, without bleeding: Secondary | ICD-10-CM | POA: Diagnosis not present

## 2022-06-30 DIAGNOSIS — R109 Unspecified abdominal pain: Secondary | ICD-10-CM | POA: Diagnosis not present

## 2022-06-30 DIAGNOSIS — G629 Polyneuropathy, unspecified: Secondary | ICD-10-CM | POA: Diagnosis not present

## 2022-06-30 DIAGNOSIS — D89813 Graft-versus-host disease, unspecified: Secondary | ICD-10-CM | POA: Diagnosis not present

## 2022-06-30 DIAGNOSIS — M25512 Pain in left shoulder: Secondary | ICD-10-CM | POA: Diagnosis not present

## 2022-06-30 DIAGNOSIS — R682 Dry mouth, unspecified: Secondary | ICD-10-CM | POA: Diagnosis not present

## 2022-06-30 DIAGNOSIS — R112 Nausea with vomiting, unspecified: Secondary | ICD-10-CM | POA: Diagnosis not present

## 2022-06-30 DIAGNOSIS — F329 Major depressive disorder, single episode, unspecified: Secondary | ICD-10-CM | POA: Diagnosis not present

## 2022-06-30 DIAGNOSIS — D61818 Other pancytopenia: Secondary | ICD-10-CM | POA: Diagnosis not present

## 2022-06-30 DIAGNOSIS — N941 Unspecified dyspareunia: Secondary | ICD-10-CM | POA: Diagnosis not present

## 2022-06-30 DIAGNOSIS — D7581 Myelofibrosis: Secondary | ICD-10-CM | POA: Diagnosis not present

## 2022-06-30 DIAGNOSIS — M25511 Pain in right shoulder: Secondary | ICD-10-CM | POA: Diagnosis not present

## 2022-06-30 DIAGNOSIS — Z9221 Personal history of antineoplastic chemotherapy: Secondary | ICD-10-CM | POA: Diagnosis not present

## 2022-06-30 DIAGNOSIS — Z5181 Encounter for therapeutic drug level monitoring: Secondary | ICD-10-CM | POA: Diagnosis not present

## 2022-06-30 DIAGNOSIS — T865 Complications of stem cell transplant: Secondary | ICD-10-CM | POA: Diagnosis not present

## 2022-06-30 DIAGNOSIS — E440 Moderate protein-calorie malnutrition: Secondary | ICD-10-CM | POA: Diagnosis not present

## 2022-06-30 DIAGNOSIS — R197 Diarrhea, unspecified: Secondary | ICD-10-CM | POA: Diagnosis not present

## 2022-06-30 DIAGNOSIS — K219 Gastro-esophageal reflux disease without esophagitis: Secondary | ICD-10-CM | POA: Diagnosis not present

## 2022-06-30 DIAGNOSIS — D849 Immunodeficiency, unspecified: Secondary | ICD-10-CM | POA: Diagnosis not present

## 2022-07-01 ENCOUNTER — Encounter
Admit: 2022-07-01 | Discharge: 2022-07-03 | Disposition: A | Discharge status: Home / Self Care | Payer: PRIVATE HEALTH INSURANCE | Attending: Critical Care Medicine

## 2022-07-01 ENCOUNTER — Ambulatory Visit
Admit: 2022-07-01 | Discharge: 2022-07-03 | Disposition: A | Discharge status: Home / Self Care | Payer: PRIVATE HEALTH INSURANCE

## 2022-07-06 ENCOUNTER — Other Ambulatory Visit: Admit: 2022-07-06 | Discharge: 2022-07-06 | Payer: PRIVATE HEALTH INSURANCE

## 2022-07-06 ENCOUNTER — Ambulatory Visit: Admit: 2022-07-06 | Discharge: 2022-07-06 | Payer: PRIVATE HEALTH INSURANCE

## 2022-07-06 ENCOUNTER — Ambulatory Visit
Admit: 2022-07-06 | Discharge: 2022-07-06 | Payer: PRIVATE HEALTH INSURANCE | Attending: Primary Care | Primary: Primary Care

## 2022-07-06 DIAGNOSIS — D474 Osteomyelofibrosis: Principal | ICD-10-CM

## 2022-07-06 DIAGNOSIS — D473 Essential (hemorrhagic) thrombocythemia: Principal | ICD-10-CM

## 2022-07-06 DIAGNOSIS — Z5181 Encounter for therapeutic drug level monitoring: Principal | ICD-10-CM

## 2022-07-06 DIAGNOSIS — D84822 Immunocompromised state associated with stem cell transplant (CMS-HCC): Principal | ICD-10-CM

## 2022-07-06 DIAGNOSIS — Z9889 Other specified postprocedural states: Principal | ICD-10-CM

## 2022-07-06 DIAGNOSIS — D696 Thrombocytopenia, unspecified: Principal | ICD-10-CM

## 2022-07-06 DIAGNOSIS — D89813 Graft-versus-host disease, unspecified: Principal | ICD-10-CM

## 2022-07-06 DIAGNOSIS — Z9484 Stem cells transplant status: Principal | ICD-10-CM

## 2022-07-06 MED ORDER — SUCRALFATE 1 GRAM TABLET
ORAL_TABLET | Freq: Every evening | ORAL | 0 refills | 30 days | Status: CP
Start: 2022-07-06 — End: 2022-08-05
  Filled 2022-07-06: qty 30, 30d supply, fill #0

## 2022-07-06 MED ORDER — TACROLIMUS 0.5 MG CAPSULE, IMMEDIATE-RELEASE
ORAL_CAPSULE | Freq: Two times a day (BID) | ORAL | 2 refills | 30 days | Status: CP
Start: 2022-07-06 — End: ?

## 2022-07-06 MED ORDER — PENICILLIN V POTASSIUM 500 MG TABLET
ORAL_TABLET | Freq: Two times a day (BID) | ORAL | 1 refills | 30 days | Status: CP
Start: 2022-07-06 — End: 2022-09-04
  Filled 2022-07-06: qty 60, 30d supply, fill #0

## 2022-07-06 MED ORDER — PREDNISONE 20 MG TABLET
ORAL_TABLET | ORAL | 0 refills | 30 days | Status: CP
Start: 2022-07-06 — End: ?

## 2022-07-06 MED ORDER — CALCIUM CARBONATE 600 MG-VITAMIN D3 20 MCG (800 UNIT) TABLET
ORAL_TABLET | Freq: Two times a day (BID) | ORAL | 3 refills | 0 days | Status: CP
Start: 2022-07-06 — End: 2022-08-05
  Filled 2022-07-06: qty 60, 30d supply, fill #0

## 2022-07-07 DIAGNOSIS — D474 Osteomyelofibrosis: Secondary | ICD-10-CM | POA: Diagnosis not present

## 2022-07-07 DIAGNOSIS — M6281 Muscle weakness (generalized): Secondary | ICD-10-CM | POA: Diagnosis not present

## 2022-07-07 DIAGNOSIS — D473 Essential (hemorrhagic) thrombocythemia: Secondary | ICD-10-CM | POA: Diagnosis not present

## 2022-07-07 DIAGNOSIS — M25511 Pain in right shoulder: Secondary | ICD-10-CM | POA: Diagnosis not present

## 2022-07-08 ENCOUNTER — Ambulatory Visit: Admit: 2022-07-08 | Discharge: 2022-07-08 | Payer: PRIVATE HEALTH INSURANCE

## 2022-07-08 ENCOUNTER — Other Ambulatory Visit: Admit: 2022-07-08 | Discharge: 2022-07-08 | Payer: PRIVATE HEALTH INSURANCE

## 2022-07-08 DIAGNOSIS — Z9481 Bone marrow transplant status: Principal | ICD-10-CM

## 2022-07-08 DIAGNOSIS — Z9484 Stem cells transplant status: Principal | ICD-10-CM

## 2022-07-08 DIAGNOSIS — D471 Chronic myeloproliferative disease: Principal | ICD-10-CM

## 2022-07-08 DIAGNOSIS — D89813 Graft-versus-host disease, unspecified: Secondary | ICD-10-CM | POA: Diagnosis not present

## 2022-07-08 DIAGNOSIS — D84822 Immunodeficiency due to external causes: Secondary | ICD-10-CM | POA: Diagnosis not present

## 2022-07-08 DIAGNOSIS — Z5181 Encounter for therapeutic drug level monitoring: Secondary | ICD-10-CM | POA: Diagnosis not present

## 2022-07-09 DIAGNOSIS — Z9484 Stem cells transplant status: Principal | ICD-10-CM

## 2022-07-09 DIAGNOSIS — M6281 Muscle weakness (generalized): Secondary | ICD-10-CM | POA: Diagnosis not present

## 2022-07-09 DIAGNOSIS — M25511 Pain in right shoulder: Secondary | ICD-10-CM | POA: Diagnosis not present

## 2022-07-09 MED ORDER — POSACONAZOLE 100 MG TABLET,DELAYED RELEASE
ORAL_TABLET | Freq: Two times a day (BID) | ORAL | 3 refills | 30 days | Status: CP
Start: 2022-07-09 — End: ?
  Filled 2022-07-21: qty 120, 30d supply, fill #0

## 2022-07-13 ENCOUNTER — Other Ambulatory Visit: Admit: 2022-07-13 | Discharge: 2022-07-13 | Payer: PRIVATE HEALTH INSURANCE

## 2022-07-13 ENCOUNTER — Ambulatory Visit
Admit: 2022-07-13 | Discharge: 2022-07-13 | Payer: PRIVATE HEALTH INSURANCE | Attending: Student in an Organized Health Care Education/Training Program | Primary: Student in an Organized Health Care Education/Training Program

## 2022-07-13 ENCOUNTER — Ambulatory Visit: Admit: 2022-07-13 | Discharge: 2022-07-13 | Payer: PRIVATE HEALTH INSURANCE

## 2022-07-13 DIAGNOSIS — D84822 Immunocompromised state associated with stem cell transplant (CMS-HCC): Principal | ICD-10-CM

## 2022-07-13 DIAGNOSIS — Z9484 Stem cells transplant status: Principal | ICD-10-CM

## 2022-07-13 DIAGNOSIS — D89813 Graft-versus-host disease, unspecified: Principal | ICD-10-CM

## 2022-07-13 DIAGNOSIS — D471 Chronic myeloproliferative disease: Principal | ICD-10-CM

## 2022-07-13 DIAGNOSIS — D473 Essential (hemorrhagic) thrombocythemia: Principal | ICD-10-CM

## 2022-07-13 DIAGNOSIS — G629 Polyneuropathy, unspecified: Secondary | ICD-10-CM | POA: Diagnosis not present

## 2022-07-13 DIAGNOSIS — F419 Anxiety disorder, unspecified: Secondary | ICD-10-CM | POA: Diagnosis not present

## 2022-07-13 DIAGNOSIS — D7581 Myelofibrosis: Secondary | ICD-10-CM | POA: Diagnosis not present

## 2022-07-13 DIAGNOSIS — D696 Thrombocytopenia, unspecified: Secondary | ICD-10-CM | POA: Diagnosis not present

## 2022-07-13 DIAGNOSIS — Z9481 Bone marrow transplant status: Secondary | ICD-10-CM | POA: Diagnosis not present

## 2022-07-13 DIAGNOSIS — D7589 Other specified diseases of blood and blood-forming organs: Secondary | ICD-10-CM | POA: Diagnosis not present

## 2022-07-13 DIAGNOSIS — R7401 Elevation of levels of liver transaminase levels: Secondary | ICD-10-CM | POA: Diagnosis not present

## 2022-07-13 DIAGNOSIS — R7402 Elevation of levels of lactic acid dehydrogenase (LDH): Secondary | ICD-10-CM | POA: Diagnosis not present

## 2022-07-13 MED ORDER — DEXAMETHASONE 0.5 MG/5 ML ORAL SOLUTION
Freq: Three times a day (TID) | ORAL | 3 refills | 8 days | Status: CP
Start: 2022-07-13 — End: ?

## 2022-07-14 DIAGNOSIS — D473 Essential (hemorrhagic) thrombocythemia: Principal | ICD-10-CM

## 2022-07-14 DIAGNOSIS — Z9484 Stem cells transplant status: Principal | ICD-10-CM

## 2022-07-14 DIAGNOSIS — D474 Osteomyelofibrosis: Principal | ICD-10-CM

## 2022-07-14 MED ORDER — TACROLIMUS 0.5 MG CAPSULE, IMMEDIATE-RELEASE
ORAL_CAPSULE | ORAL | 3 refills | 30 days | Status: CP
Start: 2022-07-14 — End: ?

## 2022-07-15 DIAGNOSIS — Z9484 Stem cells transplant status: Secondary | ICD-10-CM | POA: Diagnosis not present

## 2022-07-20 DIAGNOSIS — R112 Nausea with vomiting, unspecified: Principal | ICD-10-CM

## 2022-07-20 DIAGNOSIS — D473 Essential (hemorrhagic) thrombocythemia: Principal | ICD-10-CM

## 2022-07-20 DIAGNOSIS — Z9484 Stem cells transplant status: Principal | ICD-10-CM

## 2022-07-20 DIAGNOSIS — D474 Osteomyelofibrosis: Principal | ICD-10-CM

## 2022-07-20 MED ORDER — OLANZAPINE 2.5 MG TABLET
ORAL_TABLET | Freq: Every evening | ORAL | 1 refills | 20 days | Status: CP
Start: 2022-07-20 — End: ?

## 2022-07-21 ENCOUNTER — Ambulatory Visit: Admit: 2022-07-21 | Discharge: 2022-07-22 | Payer: PRIVATE HEALTH INSURANCE

## 2022-07-21 ENCOUNTER — Other Ambulatory Visit: Admit: 2022-07-21 | Discharge: 2022-07-22 | Payer: PRIVATE HEALTH INSURANCE

## 2022-07-21 DIAGNOSIS — D471 Chronic myeloproliferative disease: Principal | ICD-10-CM

## 2022-07-21 DIAGNOSIS — D473 Essential (hemorrhagic) thrombocythemia: Principal | ICD-10-CM

## 2022-07-21 DIAGNOSIS — Z9484 Stem cells transplant status: Principal | ICD-10-CM

## 2022-07-21 DIAGNOSIS — D474 Osteomyelofibrosis: Principal | ICD-10-CM

## 2022-07-21 DIAGNOSIS — D89813 Graft-versus-host disease, unspecified: Secondary | ICD-10-CM | POA: Diagnosis not present

## 2022-07-21 DIAGNOSIS — D84822 Immunodeficiency due to external causes: Secondary | ICD-10-CM | POA: Diagnosis not present

## 2022-07-21 DIAGNOSIS — Z5181 Encounter for therapeutic drug level monitoring: Secondary | ICD-10-CM | POA: Diagnosis not present

## 2022-07-21 DIAGNOSIS — F419 Anxiety disorder, unspecified: Secondary | ICD-10-CM | POA: Diagnosis not present

## 2022-07-21 DIAGNOSIS — Z79899 Other long term (current) drug therapy: Secondary | ICD-10-CM | POA: Diagnosis not present

## 2022-07-21 MED FILL — CALCIUM CARBONATE 600 MG-VITAMIN D3 20 MCG (800 UNIT) TABLET: ORAL | 30 days supply | Qty: 60 | Fill #1

## 2022-07-22 MED ORDER — VALGANCICLOVIR 450 MG TABLET
ORAL_TABLET | Freq: Two times a day (BID) | ORAL | 0 refills | 30 days | Status: CP
Start: 2022-07-22 — End: ?

## 2022-07-23 DIAGNOSIS — M25511 Pain in right shoulder: Secondary | ICD-10-CM | POA: Diagnosis not present

## 2022-07-23 DIAGNOSIS — M6281 Muscle weakness (generalized): Secondary | ICD-10-CM | POA: Diagnosis not present

## 2022-07-27 ENCOUNTER — Ambulatory Visit
Admit: 2022-07-27 | Discharge: 2022-07-28 | Payer: PRIVATE HEALTH INSURANCE | Attending: Primary Care | Primary: Primary Care

## 2022-07-27 ENCOUNTER — Ambulatory Visit: Admit: 2022-07-27 | Discharge: 2022-07-28 | Payer: PRIVATE HEALTH INSURANCE

## 2022-07-27 ENCOUNTER — Other Ambulatory Visit: Admit: 2022-07-27 | Discharge: 2022-07-28 | Payer: PRIVATE HEALTH INSURANCE

## 2022-07-27 DIAGNOSIS — R739 Hyperglycemia, unspecified: Principal | ICD-10-CM

## 2022-07-27 DIAGNOSIS — F19982 Other psychoactive substance use, unspecified with psychoactive substance-induced sleep disorder: Principal | ICD-10-CM

## 2022-07-27 DIAGNOSIS — Z9484 Stem cells transplant status: Principal | ICD-10-CM

## 2022-07-27 DIAGNOSIS — Z5181 Encounter for therapeutic drug level monitoring: Principal | ICD-10-CM

## 2022-07-27 DIAGNOSIS — D473 Essential (hemorrhagic) thrombocythemia: Principal | ICD-10-CM

## 2022-07-27 DIAGNOSIS — B259 Cytomegaloviral disease, unspecified: Principal | ICD-10-CM

## 2022-07-27 DIAGNOSIS — K30 Functional dyspepsia: Principal | ICD-10-CM

## 2022-07-27 DIAGNOSIS — T380X5A Adverse effect of glucocorticoids and synthetic analogues, initial encounter: Principal | ICD-10-CM

## 2022-07-27 DIAGNOSIS — D89813 Graft-versus-host disease, unspecified: Principal | ICD-10-CM

## 2022-07-27 DIAGNOSIS — D61818 Other pancytopenia: Principal | ICD-10-CM

## 2022-07-27 DIAGNOSIS — D474 Osteomyelofibrosis: Principal | ICD-10-CM

## 2022-07-27 DIAGNOSIS — R197 Diarrhea, unspecified: Secondary | ICD-10-CM | POA: Diagnosis not present

## 2022-07-27 DIAGNOSIS — R12 Heartburn: Secondary | ICD-10-CM | POA: Diagnosis not present

## 2022-07-27 DIAGNOSIS — Z7952 Long term (current) use of systemic steroids: Secondary | ICD-10-CM | POA: Diagnosis not present

## 2022-07-27 DIAGNOSIS — Z48298 Encounter for aftercare following other organ transplant: Secondary | ICD-10-CM | POA: Diagnosis not present

## 2022-07-27 DIAGNOSIS — H538 Other visual disturbances: Secondary | ICD-10-CM | POA: Diagnosis not present

## 2022-07-27 DIAGNOSIS — G4700 Insomnia, unspecified: Secondary | ICD-10-CM | POA: Diagnosis not present

## 2022-07-27 DIAGNOSIS — Z79899 Other long term (current) drug therapy: Secondary | ICD-10-CM | POA: Diagnosis not present

## 2022-07-27 MED ORDER — TACROLIMUS 0.5 MG CAPSULE, IMMEDIATE-RELEASE
ORAL_CAPSULE | 3 refills | 0 days | Status: CP
Start: 2022-07-27 — End: ?

## 2022-07-27 MED ORDER — BLOOD-GLUCOSE METER KIT WRAPPER
0 refills | 0 days | Status: CP
Start: 2022-07-27 — End: ?

## 2022-07-27 MED ORDER — MAGNESIUM OXIDE-MAGNESIUM AMINO ACID CHELATE 133 MG TABLET
ORAL_TABLET | Freq: Three times a day (TID) | ORAL | 3 refills | 34 days | Status: CP
Start: 2022-07-27 — End: 2022-11-24
  Filled 2022-07-27: qty 300, 34d supply, fill #0

## 2022-07-27 MED ORDER — LANCETS
3 refills | 0 days | Status: CP
Start: 2022-07-27 — End: ?

## 2022-07-27 MED ORDER — BLOOD GLUCOSE TEST STRIPS
3 refills | 0 days | Status: CP
Start: 2022-07-27 — End: ?

## 2022-07-27 MED ORDER — TACROLIMUS 1 MG CAPSULE, IMMEDIATE-RELEASE
ORAL_CAPSULE | 3 refills | 0 days | Status: CP
Start: 2022-07-27 — End: ?

## 2022-07-30 DIAGNOSIS — M6281 Muscle weakness (generalized): Secondary | ICD-10-CM | POA: Diagnosis not present

## 2022-07-30 DIAGNOSIS — M25511 Pain in right shoulder: Secondary | ICD-10-CM | POA: Diagnosis not present

## 2022-07-31 MED ORDER — PEN NEEDLE, DIABETIC 31 GAUGE X 5/16" (8 MM)
2 refills | 0 days | Status: CP
Start: 2022-07-31 — End: ?

## 2022-07-31 MED ORDER — INSULIN GLARGINE (U-100) 100 UNIT/ML (3 ML) SUBCUTANEOUS PEN
Freq: Every evening | SUBCUTANEOUS | 0 refills | 150 days | Status: CP
Start: 2022-07-31 — End: 2023-07-31

## 2022-08-04 DIAGNOSIS — M25511 Pain in right shoulder: Secondary | ICD-10-CM | POA: Diagnosis not present

## 2022-08-04 DIAGNOSIS — M6281 Muscle weakness (generalized): Secondary | ICD-10-CM | POA: Diagnosis not present

## 2022-08-05 ENCOUNTER — Other Ambulatory Visit: Admit: 2022-08-05 | Discharge: 2022-08-05 | Payer: PRIVATE HEALTH INSURANCE

## 2022-08-05 ENCOUNTER — Ambulatory Visit
Admit: 2022-08-05 | Discharge: 2022-08-05 | Payer: PRIVATE HEALTH INSURANCE | Attending: Pharmacist Clinician (PhC)/ Clinical Pharmacy Specialist | Primary: Pharmacist Clinician (PhC)/ Clinical Pharmacy Specialist

## 2022-08-05 ENCOUNTER — Ambulatory Visit: Admit: 2022-08-05 | Discharge: 2022-08-05 | Payer: PRIVATE HEALTH INSURANCE

## 2022-08-05 DIAGNOSIS — D61818 Other pancytopenia: Principal | ICD-10-CM

## 2022-08-05 DIAGNOSIS — T451X5A Adverse effect of antineoplastic and immunosuppressive drugs, initial encounter: Principal | ICD-10-CM

## 2022-08-05 DIAGNOSIS — R112 Nausea with vomiting, unspecified: Principal | ICD-10-CM

## 2022-08-05 DIAGNOSIS — R739 Hyperglycemia, unspecified: Principal | ICD-10-CM

## 2022-08-05 DIAGNOSIS — D474 Osteomyelofibrosis: Principal | ICD-10-CM

## 2022-08-05 DIAGNOSIS — G620 Drug-induced polyneuropathy: Principal | ICD-10-CM

## 2022-08-05 DIAGNOSIS — Z9484 Stem cells transplant status: Principal | ICD-10-CM

## 2022-08-05 DIAGNOSIS — T380X5A Adverse effect of glucocorticoids and synthetic analogues, initial encounter: Principal | ICD-10-CM

## 2022-08-05 DIAGNOSIS — D89813 Graft-versus-host disease, unspecified: Principal | ICD-10-CM

## 2022-08-05 DIAGNOSIS — D473 Essential (hemorrhagic) thrombocythemia: Principal | ICD-10-CM

## 2022-08-05 DIAGNOSIS — B259 Cytomegaloviral disease, unspecified: Principal | ICD-10-CM

## 2022-08-05 DIAGNOSIS — M6281 Muscle weakness (generalized): Secondary | ICD-10-CM | POA: Diagnosis not present

## 2022-08-05 DIAGNOSIS — R7402 Elevation of levels of lactic acid dehydrogenase (LDH): Secondary | ICD-10-CM | POA: Diagnosis not present

## 2022-08-05 DIAGNOSIS — D7589 Other specified diseases of blood and blood-forming organs: Secondary | ICD-10-CM | POA: Diagnosis not present

## 2022-08-05 DIAGNOSIS — K30 Functional dyspepsia: Secondary | ICD-10-CM | POA: Diagnosis not present

## 2022-08-05 MED ORDER — INSULIN ASPART (U-100) 100 UNIT/ML (3 ML) SUBCUTANEOUS PEN
Freq: Three times a day (TID) | SUBCUTANEOUS | 12 refills | 25 days | Status: CP
Start: 2022-08-05 — End: 2023-08-05

## 2022-08-05 MED ORDER — GABAPENTIN 100 MG CAPSULE
ORAL_CAPSULE | Freq: Every evening | ORAL | 5 refills | 30 days | Status: CP
Start: 2022-08-05 — End: 2023-02-01

## 2022-08-05 MED ORDER — OLANZAPINE 2.5 MG TABLET
ORAL_TABLET | Freq: Every evening | ORAL | 1 refills | 30 days | Status: CP
Start: 2022-08-05 — End: ?

## 2022-08-05 MED FILL — PENICILLIN V POTASSIUM 500 MG TABLET: ORAL | 30 days supply | Qty: 60 | Fill #1

## 2022-08-08 DIAGNOSIS — D474 Osteomyelofibrosis: Principal | ICD-10-CM

## 2022-08-08 DIAGNOSIS — D473 Essential (hemorrhagic) thrombocythemia: Principal | ICD-10-CM

## 2022-08-08 MED ORDER — PANTOPRAZOLE 40 MG TABLET,DELAYED RELEASE
ORAL_TABLET | Freq: Two times a day (BID) | ORAL | 1 refills | 0 days
Start: 2022-08-08 — End: ?

## 2022-08-10 DIAGNOSIS — M6281 Muscle weakness (generalized): Secondary | ICD-10-CM | POA: Diagnosis not present

## 2022-08-10 DIAGNOSIS — M25511 Pain in right shoulder: Secondary | ICD-10-CM | POA: Diagnosis not present

## 2022-08-10 MED ORDER — PANTOPRAZOLE 40 MG TABLET,DELAYED RELEASE
ORAL_TABLET | Freq: Two times a day (BID) | ORAL | 1 refills | 90 days | Status: CP
Start: 2022-08-10 — End: ?

## 2022-08-11 DIAGNOSIS — M6281 Muscle weakness (generalized): Secondary | ICD-10-CM | POA: Diagnosis not present

## 2022-08-11 DIAGNOSIS — M25511 Pain in right shoulder: Secondary | ICD-10-CM | POA: Diagnosis not present

## 2022-08-12 ENCOUNTER — Other Ambulatory Visit: Admit: 2022-08-12 | Discharge: 2022-08-13 | Payer: PRIVATE HEALTH INSURANCE

## 2022-08-12 ENCOUNTER — Institutional Professional Consult (permissible substitution): Admit: 2022-08-12 | Discharge: 2022-08-13 | Payer: PRIVATE HEALTH INSURANCE

## 2022-08-12 ENCOUNTER — Ambulatory Visit: Admit: 2022-08-12 | Discharge: 2022-08-13 | Payer: PRIVATE HEALTH INSURANCE

## 2022-08-12 DIAGNOSIS — Z9484 Stem cells transplant status: Principal | ICD-10-CM

## 2022-08-12 DIAGNOSIS — B259 Cytomegaloviral disease, unspecified: Secondary | ICD-10-CM | POA: Diagnosis not present

## 2022-08-12 DIAGNOSIS — D473 Essential (hemorrhagic) thrombocythemia: Secondary | ICD-10-CM | POA: Diagnosis not present

## 2022-08-12 DIAGNOSIS — R112 Nausea with vomiting, unspecified: Secondary | ICD-10-CM | POA: Diagnosis not present

## 2022-08-12 DIAGNOSIS — D474 Osteomyelofibrosis: Secondary | ICD-10-CM | POA: Diagnosis not present

## 2022-08-12 MED ORDER — MAGNESIUM OXIDE-MAGNESIUM AMINO ACID CHELATE 133 MG TABLET
ORAL_TABLET | Freq: Four times a day (QID) | ORAL | 3 refills | 25 days | Status: CP
Start: 2022-08-12 — End: 2022-12-10

## 2022-08-12 MED ORDER — FAMOTIDINE 20 MG TABLET
ORAL_TABLET | Freq: Every evening | ORAL | 3 refills | 60 days | Status: CP
Start: 2022-08-12 — End: ?

## 2022-08-12 MED ORDER — OLANZAPINE 2.5 MG TABLET
ORAL_TABLET | Freq: Every evening | ORAL | 1 refills | 60 days | Status: CP
Start: 2022-08-12 — End: ?

## 2022-08-12 MED ORDER — INSULIN ASPART (U-100) 100 UNIT/ML (3 ML) SUBCUTANEOUS PEN
Freq: Three times a day (TID) | SUBCUTANEOUS | 12 refills | 13 days | Status: CP
Start: 2022-08-12 — End: 2023-08-12

## 2022-08-13 DIAGNOSIS — D473 Essential (hemorrhagic) thrombocythemia: Principal | ICD-10-CM

## 2022-08-13 DIAGNOSIS — Z9484 Stem cells transplant status: Principal | ICD-10-CM

## 2022-08-13 DIAGNOSIS — D474 Osteomyelofibrosis: Principal | ICD-10-CM

## 2022-08-13 DIAGNOSIS — M25511 Pain in right shoulder: Secondary | ICD-10-CM | POA: Diagnosis not present

## 2022-08-13 DIAGNOSIS — M6281 Muscle weakness (generalized): Secondary | ICD-10-CM | POA: Diagnosis not present

## 2022-08-13 MED ORDER — TACROLIMUS 1 MG CAPSULE, IMMEDIATE-RELEASE
ORAL_CAPSULE | Freq: Two times a day (BID) | ORAL | 3 refills | 30 days | Status: CP
Start: 2022-08-13 — End: ?

## 2022-08-14 MED ORDER — INSULIN GLARGINE (U-100) 100 UNIT/ML (3 ML) SUBCUTANEOUS PEN
Freq: Every evening | SUBCUTANEOUS | 2 refills | 80 days | Status: CP
Start: 2022-08-14 — End: 2023-08-14

## 2022-08-17 DIAGNOSIS — D474 Osteomyelofibrosis: Principal | ICD-10-CM

## 2022-08-17 DIAGNOSIS — D473 Essential (hemorrhagic) thrombocythemia: Principal | ICD-10-CM

## 2022-08-17 DIAGNOSIS — Z9484 Stem cells transplant status: Principal | ICD-10-CM

## 2022-08-17 MED ORDER — MAGNESIUM OXIDE-MAGNESIUM AMINO ACID CHELATE 133 MG TABLET
ORAL_TABLET | Freq: Four times a day (QID) | ORAL | 3 refills | 25 days | Status: CP
Start: 2022-08-17 — End: 2022-12-15
  Filled 2022-08-25: qty 300, 25d supply, fill #0

## 2022-08-18 ENCOUNTER — Ambulatory Visit: Admit: 2022-08-18 | Discharge: 2022-08-19 | Payer: PRIVATE HEALTH INSURANCE

## 2022-08-18 ENCOUNTER — Other Ambulatory Visit: Admit: 2022-08-18 | Discharge: 2022-08-19 | Payer: PRIVATE HEALTH INSURANCE

## 2022-08-18 DIAGNOSIS — D89813 Graft-versus-host disease, unspecified: Secondary | ICD-10-CM | POA: Diagnosis not present

## 2022-08-18 DIAGNOSIS — D474 Osteomyelofibrosis: Secondary | ICD-10-CM | POA: Diagnosis not present

## 2022-08-18 DIAGNOSIS — D473 Essential (hemorrhagic) thrombocythemia: Secondary | ICD-10-CM | POA: Diagnosis not present

## 2022-08-18 DIAGNOSIS — T380X5A Adverse effect of glucocorticoids and synthetic analogues, initial encounter: Secondary | ICD-10-CM | POA: Diagnosis not present

## 2022-08-18 DIAGNOSIS — Z9484 Stem cells transplant status: Secondary | ICD-10-CM | POA: Diagnosis not present

## 2022-08-18 DIAGNOSIS — R739 Hyperglycemia, unspecified: Secondary | ICD-10-CM | POA: Diagnosis not present

## 2022-08-18 MED ORDER — POSACONAZOLE 100 MG TABLET,DELAYED RELEASE
ORAL_TABLET | Freq: Two times a day (BID) | ORAL | 3 refills | 30 days | Status: CP
Start: 2022-08-18 — End: ?
  Filled 2022-08-25: qty 120, 30d supply, fill #0

## 2022-08-20 DIAGNOSIS — M6281 Muscle weakness (generalized): Secondary | ICD-10-CM | POA: Diagnosis not present

## 2022-08-20 DIAGNOSIS — M25511 Pain in right shoulder: Secondary | ICD-10-CM | POA: Diagnosis not present

## 2022-08-22 DIAGNOSIS — D473 Essential (hemorrhagic) thrombocythemia: Principal | ICD-10-CM

## 2022-08-22 DIAGNOSIS — D474 Osteomyelofibrosis: Principal | ICD-10-CM

## 2022-08-22 DIAGNOSIS — Z9484 Stem cells transplant status: Principal | ICD-10-CM

## 2022-08-22 MED ORDER — PREDNISONE 20 MG TABLET
ORAL_TABLET | 1 refills | 0 days
Start: 2022-08-22 — End: ?

## 2022-08-24 DIAGNOSIS — M25511 Pain in right shoulder: Secondary | ICD-10-CM | POA: Diagnosis not present

## 2022-08-24 DIAGNOSIS — M6281 Muscle weakness (generalized): Secondary | ICD-10-CM | POA: Diagnosis not present

## 2022-08-24 MED ORDER — PREDNISONE 20 MG TABLET
ORAL_TABLET | 1 refills | 0 days
Start: 2022-08-24 — End: ?

## 2022-08-25 ENCOUNTER — Other Ambulatory Visit: Admit: 2022-08-25 | Discharge: 2022-08-26 | Payer: PRIVATE HEALTH INSURANCE

## 2022-08-25 ENCOUNTER — Ambulatory Visit: Admit: 2022-08-25 | Discharge: 2022-08-26 | Payer: PRIVATE HEALTH INSURANCE

## 2022-08-25 ENCOUNTER — Ambulatory Visit
Admit: 2022-08-25 | Discharge: 2022-08-26 | Payer: PRIVATE HEALTH INSURANCE | Attending: Pharmacist Clinician (PhC)/ Clinical Pharmacy Specialist | Primary: Pharmacist Clinician (PhC)/ Clinical Pharmacy Specialist

## 2022-08-25 DIAGNOSIS — D474 Osteomyelofibrosis: Principal | ICD-10-CM

## 2022-08-25 DIAGNOSIS — T380X5A Adverse effect of glucocorticoids and synthetic analogues, initial encounter: Principal | ICD-10-CM

## 2022-08-25 DIAGNOSIS — Z9484 Stem cells transplant status: Principal | ICD-10-CM

## 2022-08-25 DIAGNOSIS — D473 Essential (hemorrhagic) thrombocythemia: Principal | ICD-10-CM

## 2022-08-25 DIAGNOSIS — R739 Hyperglycemia, unspecified: Principal | ICD-10-CM

## 2022-08-25 DIAGNOSIS — D7589 Other specified diseases of blood and blood-forming organs: Secondary | ICD-10-CM | POA: Diagnosis not present

## 2022-08-25 DIAGNOSIS — D84822 Immunodeficiency due to external causes: Secondary | ICD-10-CM | POA: Diagnosis not present

## 2022-08-25 DIAGNOSIS — R519 Headache, unspecified: Secondary | ICD-10-CM | POA: Diagnosis not present

## 2022-08-25 DIAGNOSIS — Z5181 Encounter for therapeutic drug level monitoring: Secondary | ICD-10-CM | POA: Diagnosis not present

## 2022-08-25 DIAGNOSIS — K30 Functional dyspepsia: Secondary | ICD-10-CM | POA: Diagnosis not present

## 2022-08-25 DIAGNOSIS — R7401 Elevation of levels of liver transaminase levels: Secondary | ICD-10-CM | POA: Diagnosis not present

## 2022-08-25 DIAGNOSIS — D89813 Graft-versus-host disease, unspecified: Secondary | ICD-10-CM | POA: Diagnosis not present

## 2022-08-25 DIAGNOSIS — D7581 Myelofibrosis: Secondary | ICD-10-CM | POA: Diagnosis not present

## 2022-08-25 DIAGNOSIS — G4700 Insomnia, unspecified: Secondary | ICD-10-CM | POA: Diagnosis not present

## 2022-08-25 DIAGNOSIS — D61818 Other pancytopenia: Secondary | ICD-10-CM | POA: Diagnosis not present

## 2022-08-25 DIAGNOSIS — M7989 Other specified soft tissue disorders: Secondary | ICD-10-CM | POA: Diagnosis not present

## 2022-08-25 DIAGNOSIS — R7402 Elevation of levels of lactic acid dehydrogenase (LDH): Secondary | ICD-10-CM | POA: Diagnosis not present

## 2022-08-25 MED ORDER — VALACYCLOVIR 500 MG TABLET
ORAL_TABLET | Freq: Two times a day (BID) | ORAL | 11 refills | 30 days | Status: CP
Start: 2022-08-25 — End: ?

## 2022-08-25 MED ORDER — TACROLIMUS 0.5 MG CAPSULE, IMMEDIATE-RELEASE
ORAL_CAPSULE | ORAL | 1 refills | 30 days | Status: CP
Start: 2022-08-25 — End: ?

## 2022-08-27 DIAGNOSIS — M6281 Muscle weakness (generalized): Secondary | ICD-10-CM | POA: Diagnosis not present

## 2022-08-27 DIAGNOSIS — M25511 Pain in right shoulder: Secondary | ICD-10-CM | POA: Diagnosis not present

## 2022-08-27 MED ORDER — PEN NEEDLE, DIABETIC 31 GAUGE X 5/16" (8 MM)
2 refills | 0 days | Status: CP
Start: 2022-08-27 — End: ?

## 2022-09-01 DIAGNOSIS — D474 Osteomyelofibrosis: Principal | ICD-10-CM

## 2022-09-01 DIAGNOSIS — D473 Essential (hemorrhagic) thrombocythemia: Principal | ICD-10-CM

## 2022-09-01 DIAGNOSIS — Z9484 Stem cells transplant status: Principal | ICD-10-CM

## 2022-09-01 MED ORDER — PENICILLIN V POTASSIUM 500 MG TABLET
ORAL_TABLET | Freq: Two times a day (BID) | ORAL | 1 refills | 30 days
Start: 2022-09-01 — End: 2022-10-31

## 2022-09-02 DIAGNOSIS — M9902 Segmental and somatic dysfunction of thoracic region: Secondary | ICD-10-CM | POA: Diagnosis not present

## 2022-09-02 DIAGNOSIS — M9901 Segmental and somatic dysfunction of cervical region: Secondary | ICD-10-CM | POA: Diagnosis not present

## 2022-09-02 DIAGNOSIS — M7541 Impingement syndrome of right shoulder: Secondary | ICD-10-CM | POA: Diagnosis not present

## 2022-09-02 DIAGNOSIS — M9907 Segmental and somatic dysfunction of upper extremity: Secondary | ICD-10-CM | POA: Diagnosis not present

## 2022-09-02 MED ORDER — PENICILLIN V POTASSIUM 500 MG TABLET
ORAL_TABLET | Freq: Two times a day (BID) | ORAL | 1 refills | 30 days | Status: CP
Start: 2022-09-02 — End: 2022-11-01
  Filled 2022-09-03: qty 60, 30d supply, fill #0

## 2022-09-03 DIAGNOSIS — M25511 Pain in right shoulder: Secondary | ICD-10-CM | POA: Diagnosis not present

## 2022-09-03 DIAGNOSIS — M6281 Muscle weakness (generalized): Secondary | ICD-10-CM | POA: Diagnosis not present

## 2022-09-03 MED FILL — CALCIUM CARBONATE 600 MG-VITAMIN D3 20 MCG (800 UNIT) TABLET: ORAL | 38 days supply | Qty: 75 | Fill #0

## 2022-09-07 DIAGNOSIS — Z9481 Bone marrow transplant status: Principal | ICD-10-CM

## 2022-09-07 DIAGNOSIS — M9902 Segmental and somatic dysfunction of thoracic region: Secondary | ICD-10-CM | POA: Diagnosis not present

## 2022-09-07 DIAGNOSIS — M9907 Segmental and somatic dysfunction of upper extremity: Secondary | ICD-10-CM | POA: Diagnosis not present

## 2022-09-07 DIAGNOSIS — M7541 Impingement syndrome of right shoulder: Secondary | ICD-10-CM | POA: Diagnosis not present

## 2022-09-07 DIAGNOSIS — M9901 Segmental and somatic dysfunction of cervical region: Secondary | ICD-10-CM | POA: Diagnosis not present

## 2022-09-08 ENCOUNTER — Ambulatory Visit: Admit: 2022-09-08 | Discharge: 2022-09-08 | Payer: PRIVATE HEALTH INSURANCE

## 2022-09-08 ENCOUNTER — Other Ambulatory Visit: Admit: 2022-09-08 | Discharge: 2022-09-08 | Payer: PRIVATE HEALTH INSURANCE

## 2022-09-08 DIAGNOSIS — D474 Osteomyelofibrosis: Principal | ICD-10-CM

## 2022-09-08 DIAGNOSIS — T380X5A Adverse effect of glucocorticoids and synthetic analogues, initial encounter: Principal | ICD-10-CM

## 2022-09-08 DIAGNOSIS — Z9484 Stem cells transplant status: Principal | ICD-10-CM

## 2022-09-08 DIAGNOSIS — R739 Hyperglycemia, unspecified: Principal | ICD-10-CM

## 2022-09-08 DIAGNOSIS — D473 Essential (hemorrhagic) thrombocythemia: Principal | ICD-10-CM

## 2022-09-08 DIAGNOSIS — D84822 Immunocompromised state associated with stem cell transplant (CMS-HCC): Principal | ICD-10-CM

## 2022-09-08 DIAGNOSIS — Z9481 Bone marrow transplant status: Principal | ICD-10-CM

## 2022-09-08 DIAGNOSIS — D89813 Graft-versus-host disease, unspecified: Principal | ICD-10-CM

## 2022-09-08 DIAGNOSIS — G4700 Insomnia, unspecified: Secondary | ICD-10-CM | POA: Diagnosis not present

## 2022-09-08 DIAGNOSIS — D7581 Myelofibrosis: Secondary | ICD-10-CM | POA: Diagnosis not present

## 2022-09-08 DIAGNOSIS — Z794 Long term (current) use of insulin: Secondary | ICD-10-CM | POA: Diagnosis not present

## 2022-09-08 DIAGNOSIS — D61818 Other pancytopenia: Secondary | ICD-10-CM | POA: Diagnosis not present

## 2022-09-08 DIAGNOSIS — Z48290 Encounter for aftercare following bone marrow transplant: Secondary | ICD-10-CM | POA: Diagnosis not present

## 2022-09-08 DIAGNOSIS — Z7952 Long term (current) use of systemic steroids: Secondary | ICD-10-CM | POA: Diagnosis not present

## 2022-09-08 DIAGNOSIS — R109 Unspecified abdominal pain: Secondary | ICD-10-CM | POA: Diagnosis not present

## 2022-09-08 DIAGNOSIS — R7401 Elevation of levels of liver transaminase levels: Secondary | ICD-10-CM | POA: Diagnosis not present

## 2022-09-08 DIAGNOSIS — R7402 Elevation of levels of lactic acid dehydrogenase (LDH): Secondary | ICD-10-CM | POA: Diagnosis not present

## 2022-09-08 DIAGNOSIS — M6281 Muscle weakness (generalized): Secondary | ICD-10-CM | POA: Diagnosis not present

## 2022-09-09 DIAGNOSIS — D473 Essential (hemorrhagic) thrombocythemia: Principal | ICD-10-CM

## 2022-09-09 DIAGNOSIS — D474 Osteomyelofibrosis: Principal | ICD-10-CM

## 2022-09-09 DIAGNOSIS — Z9484 Stem cells transplant status: Principal | ICD-10-CM

## 2022-09-09 DIAGNOSIS — M6281 Muscle weakness (generalized): Secondary | ICD-10-CM | POA: Diagnosis not present

## 2022-09-09 DIAGNOSIS — M25511 Pain in right shoulder: Secondary | ICD-10-CM | POA: Diagnosis not present

## 2022-09-09 MED ORDER — PREDNISONE 5 MG TABLET
ORAL_TABLET | Freq: Every day | ORAL | 1 refills | 30 days | Status: CP
Start: 2022-09-09 — End: ?

## 2022-09-16 DIAGNOSIS — M7541 Impingement syndrome of right shoulder: Secondary | ICD-10-CM | POA: Diagnosis not present

## 2022-09-16 DIAGNOSIS — M9902 Segmental and somatic dysfunction of thoracic region: Secondary | ICD-10-CM | POA: Diagnosis not present

## 2022-09-16 DIAGNOSIS — M9907 Segmental and somatic dysfunction of upper extremity: Secondary | ICD-10-CM | POA: Diagnosis not present

## 2022-09-16 DIAGNOSIS — M9901 Segmental and somatic dysfunction of cervical region: Secondary | ICD-10-CM | POA: Diagnosis not present

## 2022-09-17 DIAGNOSIS — M6281 Muscle weakness (generalized): Secondary | ICD-10-CM | POA: Diagnosis not present

## 2022-09-17 DIAGNOSIS — M25511 Pain in right shoulder: Secondary | ICD-10-CM | POA: Diagnosis not present

## 2022-09-17 IMAGING — MG MM DIGITAL SCREENING BILAT W/ TOMO AND CAD
8 series · 9 of 24 positions shown · non-contrast
Comparison: Previous exam(s).

CLINICAL DATA: Screening.

EXAM:
DIGITAL SCREENING BILATERAL MAMMOGRAM WITH TOMOSYNTHESIS AND CAD
TECHNIQUE: Bilateral screening digital craniocaudal and mediolateral oblique
mammograms were obtained. Bilateral screening digital breast
tomosynthesis was performed. The images were evaluated with
computer-aided detection.

[L CC synth-2D]
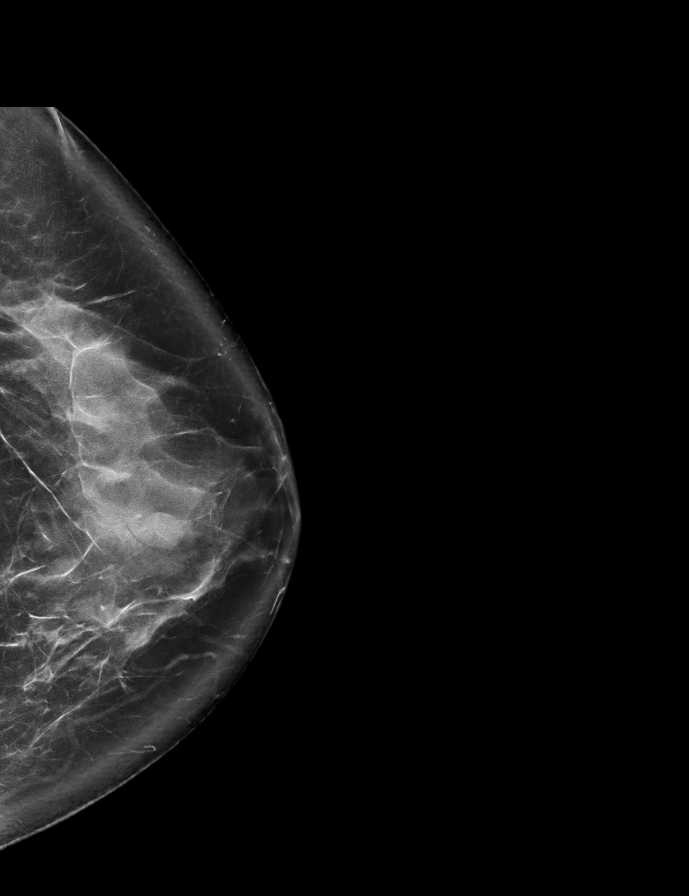

[L MLO synth-2D]
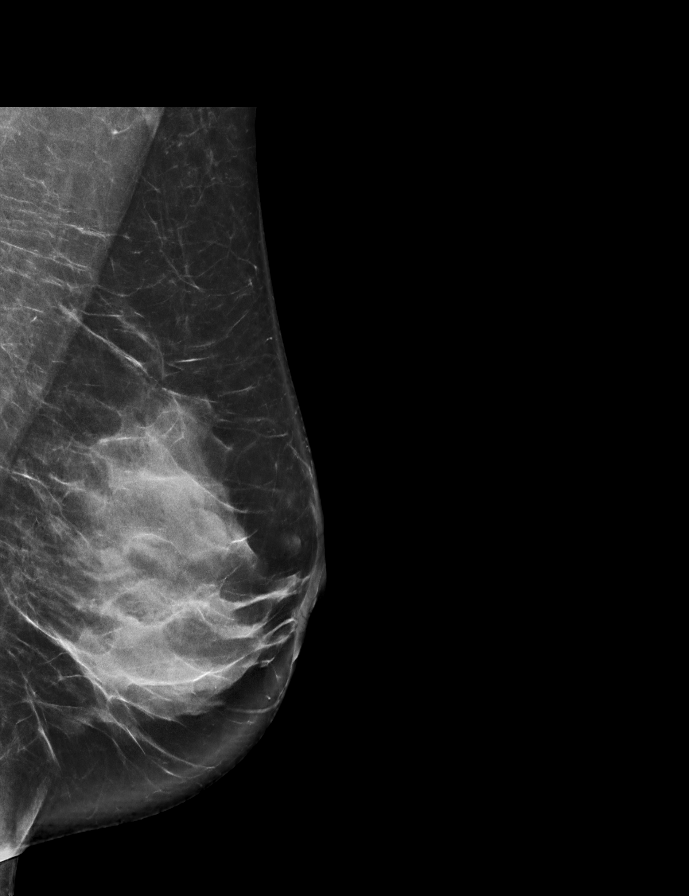

[R MLO synth-2D]
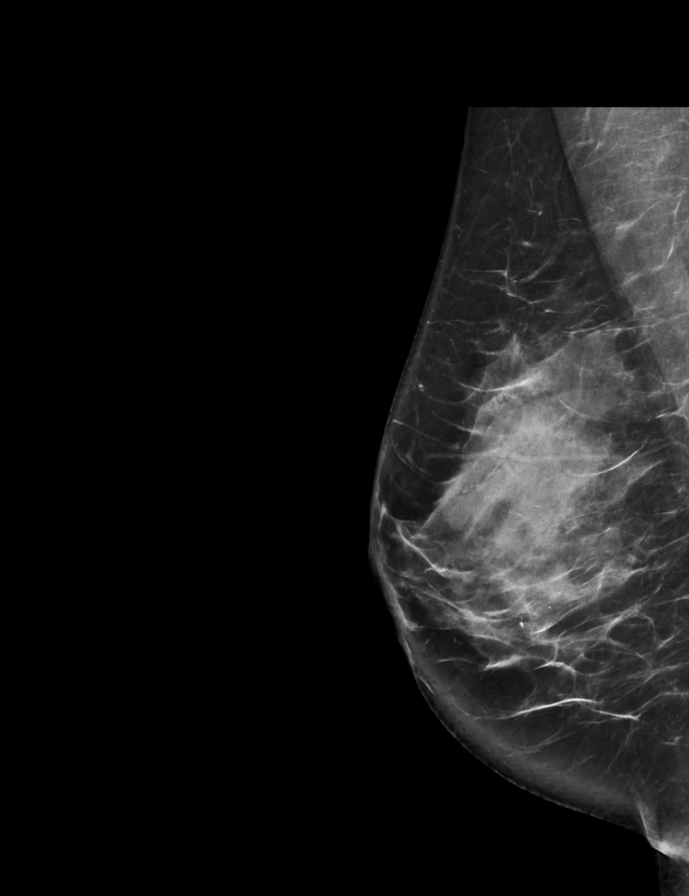

[R CC synth-2D]
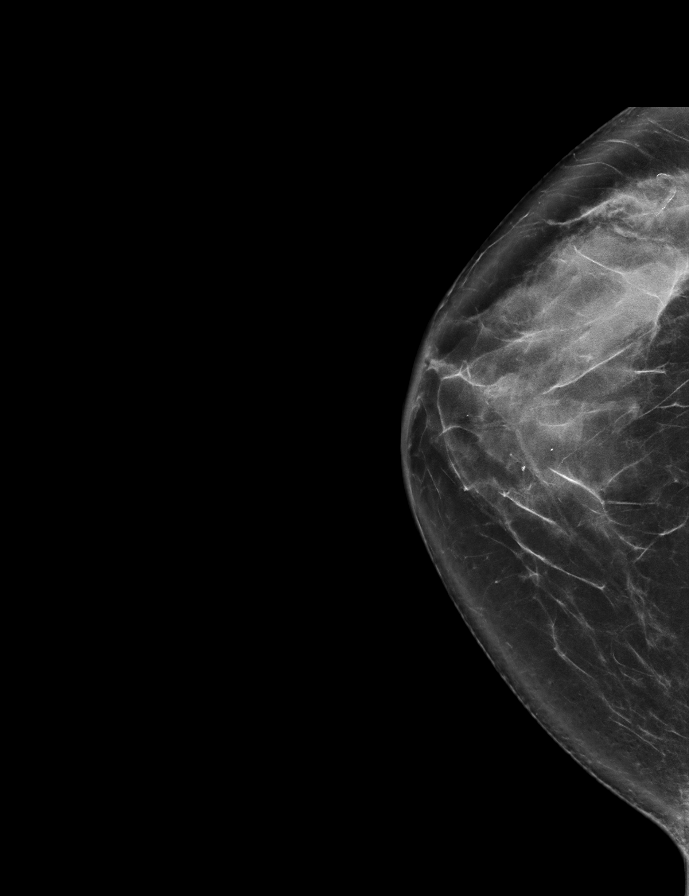

[L MLO tomo · 2 of 85 frames shown]
[frame 28/85]
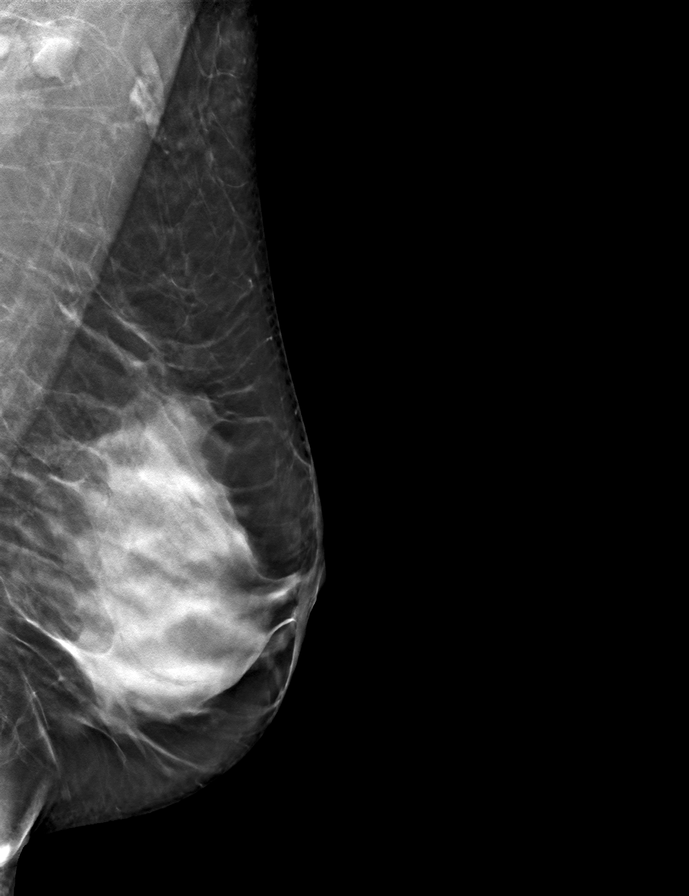
[frame 43/85]
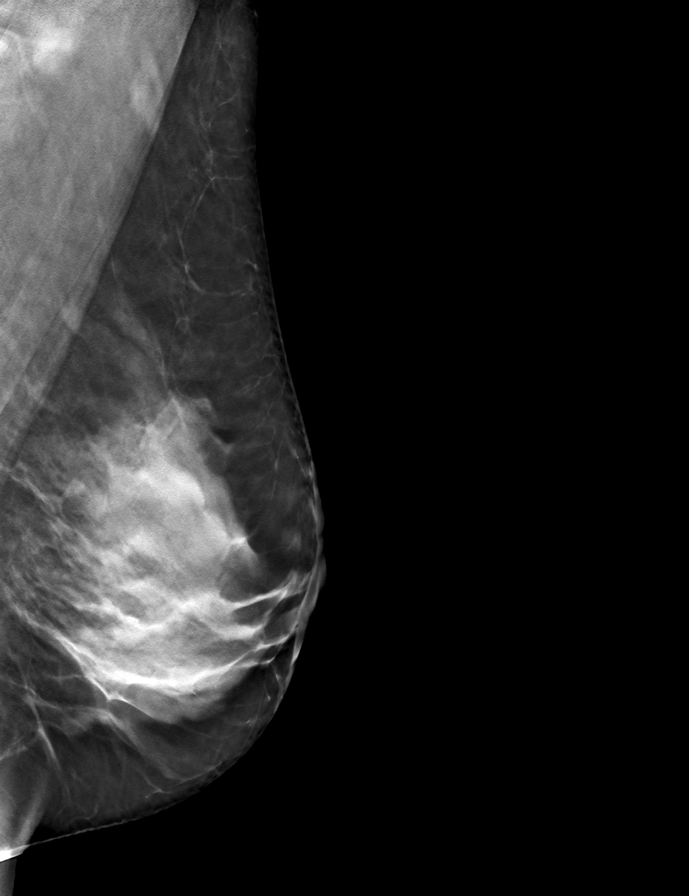

[R CC tomo · tomo slice 39/78.0]
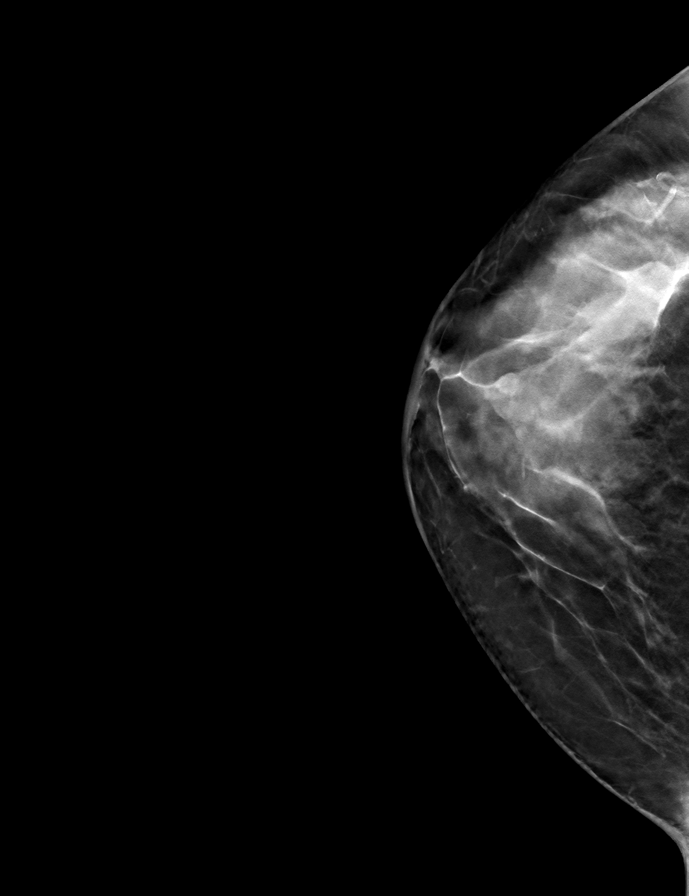

[L CC tomo · tomo slice 46/91.0]
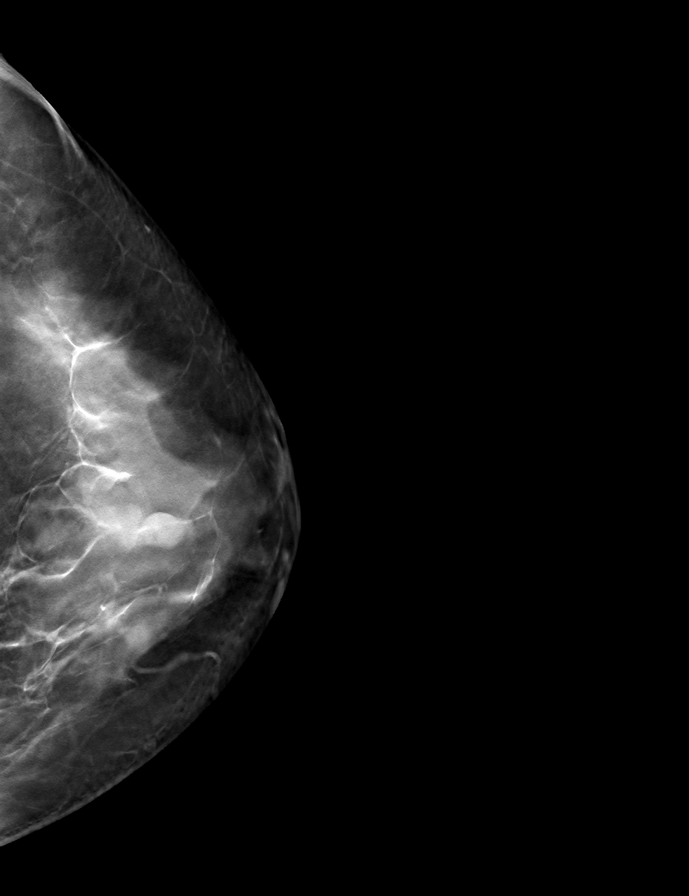

[R MLO tomo · tomo slice 43/85.0]
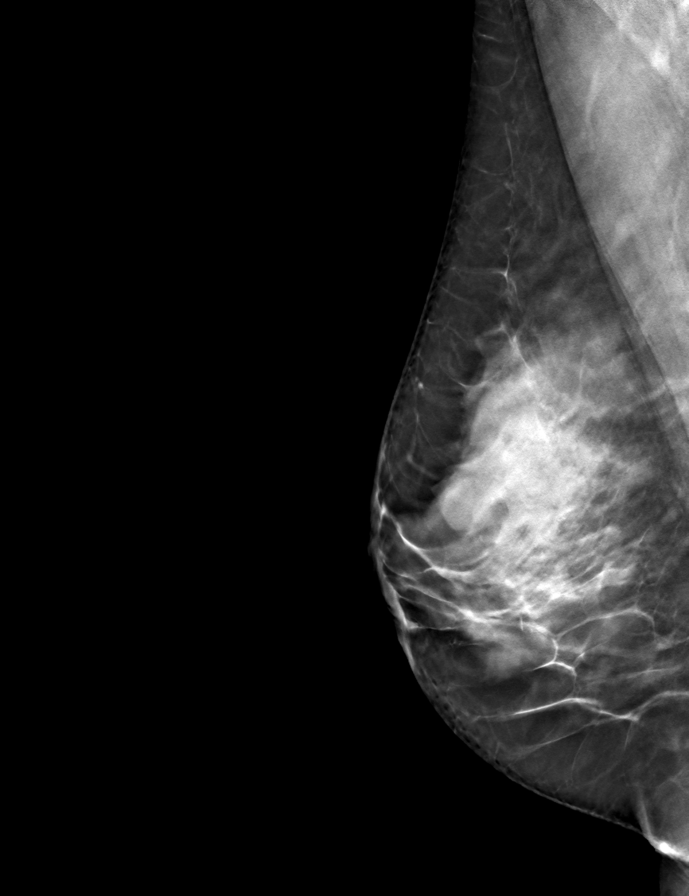

[9 of 24 positions shown; findings below may reference images not displayed]

ACR Breast Density Category c: The breast tissue is heterogeneously
dense, which may obscure small masses.
FINDINGS: There are no findings suspicious for malignancy.
IMPRESSION: No mammographic evidence of malignancy. A result letter of this
screening mammogram will be mailed directly to the patient.

RECOMMENDATION:
Screening mammogram in one year. (Code:Q3-W-BC3)

BI-RADS CATEGORY  1: Negative.

## 2022-09-21 DIAGNOSIS — M25511 Pain in right shoulder: Secondary | ICD-10-CM | POA: Diagnosis not present

## 2022-09-21 DIAGNOSIS — M6281 Muscle weakness (generalized): Secondary | ICD-10-CM | POA: Diagnosis not present

## 2022-09-22 ENCOUNTER — Other Ambulatory Visit: Admit: 2022-09-22 | Discharge: 2022-09-23 | Payer: PRIVATE HEALTH INSURANCE

## 2022-09-22 ENCOUNTER — Ambulatory Visit: Admit: 2022-09-22 | Discharge: 2022-09-23 | Payer: PRIVATE HEALTH INSURANCE

## 2022-09-22 DIAGNOSIS — D474 Osteomyelofibrosis: Principal | ICD-10-CM

## 2022-09-22 DIAGNOSIS — Z9484 Stem cells transplant status: Principal | ICD-10-CM

## 2022-09-22 DIAGNOSIS — D473 Essential (hemorrhagic) thrombocythemia: Principal | ICD-10-CM

## 2022-09-22 DIAGNOSIS — D84822 Immunodeficiency due to external causes: Secondary | ICD-10-CM | POA: Diagnosis not present

## 2022-09-22 DIAGNOSIS — D7581 Myelofibrosis: Secondary | ICD-10-CM | POA: Diagnosis not present

## 2022-09-22 DIAGNOSIS — D89813 Graft-versus-host disease, unspecified: Secondary | ICD-10-CM | POA: Diagnosis not present

## 2022-09-22 MED ORDER — PREDNISONE 5 MG TABLET
ORAL_TABLET | Freq: Every day | ORAL | 1 refills | 45 days | Status: CP
Start: 2022-09-22 — End: ?

## 2022-09-22 MED FILL — MG-PLUS-PROTEIN 133 MG TABLET: ORAL | 25 days supply | Qty: 300 | Fill #1

## 2022-09-24 DIAGNOSIS — M9902 Segmental and somatic dysfunction of thoracic region: Secondary | ICD-10-CM | POA: Diagnosis not present

## 2022-09-24 DIAGNOSIS — M7541 Impingement syndrome of right shoulder: Secondary | ICD-10-CM | POA: Diagnosis not present

## 2022-09-24 DIAGNOSIS — M9907 Segmental and somatic dysfunction of upper extremity: Secondary | ICD-10-CM | POA: Diagnosis not present

## 2022-09-24 DIAGNOSIS — M9901 Segmental and somatic dysfunction of cervical region: Secondary | ICD-10-CM | POA: Diagnosis not present

## 2022-09-25 DIAGNOSIS — D2271 Melanocytic nevi of right lower limb, including hip: Secondary | ICD-10-CM | POA: Diagnosis not present

## 2022-09-25 DIAGNOSIS — B078 Other viral warts: Secondary | ICD-10-CM | POA: Diagnosis not present

## 2022-09-25 DIAGNOSIS — D2272 Melanocytic nevi of left lower limb, including hip: Secondary | ICD-10-CM | POA: Diagnosis not present

## 2022-09-25 DIAGNOSIS — Q828 Other specified congenital malformations of skin: Secondary | ICD-10-CM | POA: Diagnosis not present

## 2022-09-25 DIAGNOSIS — L719 Rosacea, unspecified: Secondary | ICD-10-CM | POA: Diagnosis not present

## 2022-09-25 DIAGNOSIS — D2262 Melanocytic nevi of left upper limb, including shoulder: Secondary | ICD-10-CM | POA: Diagnosis not present

## 2022-09-25 DIAGNOSIS — L821 Other seborrheic keratosis: Secondary | ICD-10-CM | POA: Diagnosis not present

## 2022-09-25 DIAGNOSIS — L720 Epidermal cyst: Secondary | ICD-10-CM | POA: Diagnosis not present

## 2022-09-25 DIAGNOSIS — D225 Melanocytic nevi of trunk: Secondary | ICD-10-CM | POA: Diagnosis not present

## 2022-09-25 DIAGNOSIS — L578 Other skin changes due to chronic exposure to nonionizing radiation: Secondary | ICD-10-CM | POA: Diagnosis not present

## 2022-09-30 DIAGNOSIS — M9902 Segmental and somatic dysfunction of thoracic region: Secondary | ICD-10-CM | POA: Diagnosis not present

## 2022-09-30 DIAGNOSIS — M9907 Segmental and somatic dysfunction of upper extremity: Secondary | ICD-10-CM | POA: Diagnosis not present

## 2022-09-30 DIAGNOSIS — M7541 Impingement syndrome of right shoulder: Secondary | ICD-10-CM | POA: Diagnosis not present

## 2022-09-30 DIAGNOSIS — M9901 Segmental and somatic dysfunction of cervical region: Secondary | ICD-10-CM | POA: Diagnosis not present

## 2022-10-01 DIAGNOSIS — M25511 Pain in right shoulder: Secondary | ICD-10-CM | POA: Diagnosis not present

## 2022-10-01 DIAGNOSIS — M6281 Muscle weakness (generalized): Secondary | ICD-10-CM | POA: Diagnosis not present

## 2022-10-05 DIAGNOSIS — M6281 Muscle weakness (generalized): Secondary | ICD-10-CM | POA: Diagnosis not present

## 2022-10-05 DIAGNOSIS — M25511 Pain in right shoulder: Secondary | ICD-10-CM | POA: Diagnosis not present

## 2022-10-06 DIAGNOSIS — Z1331 Encounter for screening for depression: Secondary | ICD-10-CM | POA: Diagnosis not present

## 2022-10-06 DIAGNOSIS — N952 Postmenopausal atrophic vaginitis: Secondary | ICD-10-CM | POA: Diagnosis not present

## 2022-10-06 DIAGNOSIS — N941 Unspecified dyspareunia: Secondary | ICD-10-CM | POA: Diagnosis not present

## 2022-10-06 DIAGNOSIS — Z139 Encounter for screening, unspecified: Secondary | ICD-10-CM | POA: Diagnosis not present

## 2022-10-06 DIAGNOSIS — N94810 Vulvar vestibulitis: Secondary | ICD-10-CM | POA: Diagnosis not present

## 2022-10-06 DIAGNOSIS — D7581 Myelofibrosis: Secondary | ICD-10-CM | POA: Diagnosis not present

## 2022-10-06 DIAGNOSIS — Z01419 Encounter for gynecological examination (general) (routine) without abnormal findings: Secondary | ICD-10-CM | POA: Diagnosis not present

## 2022-10-06 DIAGNOSIS — Z124 Encounter for screening for malignant neoplasm of cervix: Secondary | ICD-10-CM | POA: Diagnosis not present

## 2022-10-06 DIAGNOSIS — Z1211 Encounter for screening for malignant neoplasm of colon: Secondary | ICD-10-CM | POA: Diagnosis not present

## 2022-10-06 DIAGNOSIS — Z1239 Encounter for other screening for malignant neoplasm of breast: Secondary | ICD-10-CM | POA: Diagnosis not present

## 2022-10-07 ENCOUNTER — Other Ambulatory Visit: Admit: 2022-10-07 | Discharge: 2022-10-07 | Payer: PRIVATE HEALTH INSURANCE

## 2022-10-07 ENCOUNTER — Ambulatory Visit: Admit: 2022-10-07 | Discharge: 2022-10-07 | Payer: PRIVATE HEALTH INSURANCE

## 2022-10-07 DIAGNOSIS — Z9484 Stem cells transplant status: Principal | ICD-10-CM

## 2022-10-07 DIAGNOSIS — D473 Essential (hemorrhagic) thrombocythemia: Principal | ICD-10-CM

## 2022-10-07 DIAGNOSIS — D474 Osteomyelofibrosis: Principal | ICD-10-CM

## 2022-10-07 DIAGNOSIS — T380X5A Adverse effect of glucocorticoids and synthetic analogues, initial encounter: Secondary | ICD-10-CM | POA: Diagnosis not present

## 2022-10-07 DIAGNOSIS — D89813 Graft-versus-host disease, unspecified: Secondary | ICD-10-CM | POA: Diagnosis not present

## 2022-10-07 DIAGNOSIS — M7989 Other specified soft tissue disorders: Secondary | ICD-10-CM | POA: Diagnosis not present

## 2022-10-07 DIAGNOSIS — D7581 Myelofibrosis: Secondary | ICD-10-CM | POA: Diagnosis not present

## 2022-10-07 DIAGNOSIS — R739 Hyperglycemia, unspecified: Secondary | ICD-10-CM | POA: Diagnosis not present

## 2022-10-07 DIAGNOSIS — B259 Cytomegaloviral disease, unspecified: Secondary | ICD-10-CM | POA: Diagnosis not present

## 2022-10-07 DIAGNOSIS — G4700 Insomnia, unspecified: Secondary | ICD-10-CM | POA: Diagnosis not present

## 2022-10-07 DIAGNOSIS — Z5181 Encounter for therapeutic drug level monitoring: Secondary | ICD-10-CM | POA: Diagnosis not present

## 2022-10-07 DIAGNOSIS — M6281 Muscle weakness (generalized): Secondary | ICD-10-CM | POA: Diagnosis not present

## 2022-10-07 DIAGNOSIS — T865 Complications of stem cell transplant: Secondary | ICD-10-CM | POA: Diagnosis not present

## 2022-10-07 DIAGNOSIS — D61818 Other pancytopenia: Secondary | ICD-10-CM | POA: Diagnosis not present

## 2022-10-07 DIAGNOSIS — D84822 Immunodeficiency due to external causes: Secondary | ICD-10-CM | POA: Diagnosis not present

## 2022-10-08 DIAGNOSIS — D473 Essential (hemorrhagic) thrombocythemia: Principal | ICD-10-CM

## 2022-10-08 DIAGNOSIS — D474 Osteomyelofibrosis: Principal | ICD-10-CM

## 2022-10-08 DIAGNOSIS — Z9484 Stem cells transplant status: Principal | ICD-10-CM

## 2022-10-08 DIAGNOSIS — M7541 Impingement syndrome of right shoulder: Secondary | ICD-10-CM | POA: Diagnosis not present

## 2022-10-08 DIAGNOSIS — M9902 Segmental and somatic dysfunction of thoracic region: Secondary | ICD-10-CM | POA: Diagnosis not present

## 2022-10-08 DIAGNOSIS — M9907 Segmental and somatic dysfunction of upper extremity: Secondary | ICD-10-CM | POA: Diagnosis not present

## 2022-10-08 DIAGNOSIS — M9901 Segmental and somatic dysfunction of cervical region: Secondary | ICD-10-CM | POA: Diagnosis not present

## 2022-10-08 MED ORDER — TACROLIMUS 0.5 MG CAPSULE, IMMEDIATE-RELEASE
ORAL_CAPSULE | ORAL | 1 refills | 30 days | Status: CP
Start: 2022-10-08 — End: ?

## 2022-10-12 ENCOUNTER — Other Ambulatory Visit: Admit: 2022-10-12 | Discharge: 2022-10-13 | Payer: PRIVATE HEALTH INSURANCE

## 2022-10-12 DIAGNOSIS — Z9484 Stem cells transplant status: Principal | ICD-10-CM

## 2022-10-12 DIAGNOSIS — D474 Osteomyelofibrosis: Principal | ICD-10-CM

## 2022-10-12 DIAGNOSIS — D473 Essential (hemorrhagic) thrombocythemia: Principal | ICD-10-CM

## 2022-10-12 MED ORDER — TACROLIMUS 0.5 MG CAPSULE, IMMEDIATE-RELEASE
ORAL_CAPSULE | Freq: Two times a day (BID) | ORAL | 1 refills | 18 days | Status: CP
Start: 2022-10-12 — End: ?

## 2022-10-13 DIAGNOSIS — M6281 Muscle weakness (generalized): Secondary | ICD-10-CM | POA: Diagnosis not present

## 2022-10-13 DIAGNOSIS — M25511 Pain in right shoulder: Secondary | ICD-10-CM | POA: Diagnosis not present

## 2022-10-14 DIAGNOSIS — M25511 Pain in right shoulder: Secondary | ICD-10-CM | POA: Diagnosis not present

## 2022-10-14 DIAGNOSIS — M6281 Muscle weakness (generalized): Secondary | ICD-10-CM | POA: Diagnosis not present

## 2022-10-15 DIAGNOSIS — M7541 Impingement syndrome of right shoulder: Secondary | ICD-10-CM | POA: Diagnosis not present

## 2022-10-15 DIAGNOSIS — M9907 Segmental and somatic dysfunction of upper extremity: Secondary | ICD-10-CM | POA: Diagnosis not present

## 2022-10-15 DIAGNOSIS — M9902 Segmental and somatic dysfunction of thoracic region: Secondary | ICD-10-CM | POA: Diagnosis not present

## 2022-10-15 DIAGNOSIS — M9901 Segmental and somatic dysfunction of cervical region: Secondary | ICD-10-CM | POA: Diagnosis not present

## 2022-10-19 DIAGNOSIS — M6281 Muscle weakness (generalized): Secondary | ICD-10-CM | POA: Diagnosis not present

## 2022-10-19 DIAGNOSIS — M25511 Pain in right shoulder: Secondary | ICD-10-CM | POA: Diagnosis not present

## 2022-10-20 ENCOUNTER — Ambulatory Visit: Admit: 2022-10-20 | Discharge: 2022-10-21 | Payer: PRIVATE HEALTH INSURANCE

## 2022-10-20 ENCOUNTER — Other Ambulatory Visit: Admit: 2022-10-20 | Discharge: 2022-10-21 | Payer: PRIVATE HEALTH INSURANCE

## 2022-10-20 DIAGNOSIS — D474 Osteomyelofibrosis: Principal | ICD-10-CM

## 2022-10-20 DIAGNOSIS — Z9484 Stem cells transplant status: Principal | ICD-10-CM

## 2022-10-20 DIAGNOSIS — Z5181 Encounter for therapeutic drug level monitoring: Principal | ICD-10-CM

## 2022-10-20 DIAGNOSIS — D84822 Immunocompromised state associated with stem cell transplant (CMS-HCC): Principal | ICD-10-CM

## 2022-10-20 DIAGNOSIS — D473 Essential (hemorrhagic) thrombocythemia: Principal | ICD-10-CM

## 2022-10-20 DIAGNOSIS — R739 Hyperglycemia, unspecified: Principal | ICD-10-CM

## 2022-10-20 DIAGNOSIS — D89813 Graft-versus-host disease, unspecified: Principal | ICD-10-CM

## 2022-10-20 DIAGNOSIS — T380X5A Adverse effect of glucocorticoids and synthetic analogues, initial encounter: Principal | ICD-10-CM

## 2022-10-20 DIAGNOSIS — Z79899 Other long term (current) drug therapy: Secondary | ICD-10-CM | POA: Diagnosis not present

## 2022-10-20 DIAGNOSIS — Z48298 Encounter for aftercare following other organ transplant: Secondary | ICD-10-CM | POA: Diagnosis not present

## 2022-10-20 DIAGNOSIS — G4700 Insomnia, unspecified: Secondary | ICD-10-CM | POA: Diagnosis not present

## 2022-10-20 DIAGNOSIS — F419 Anxiety disorder, unspecified: Secondary | ICD-10-CM | POA: Diagnosis not present

## 2022-10-20 DIAGNOSIS — Z79631 Long term (current) use of antimetabolite agent: Secondary | ICD-10-CM | POA: Diagnosis not present

## 2022-10-20 MED ORDER — TACROLIMUS 0.5 MG CAPSULE, IMMEDIATE-RELEASE
ORAL_CAPSULE | Freq: Two times a day (BID) | ORAL | 1 refills | 30 days | Status: CP
Start: 2022-10-20 — End: ?

## 2022-10-20 MED ORDER — CALCIUM 600 MG (AS CARBONATE)-VITAMIN D3 20 MCG (800 UNIT) TABLET
ORAL_TABLET | Freq: Two times a day (BID) | ORAL | 3 refills | 0 days | Status: CP
Start: 2022-10-20 — End: 2022-11-19
  Filled 2022-11-04: qty 75, 38d supply, fill #0

## 2022-10-20 MED FILL — MG-PLUS-PROTEIN 133 MG TABLET: ORAL | 25 days supply | Qty: 300 | Fill #2

## 2022-10-21 DIAGNOSIS — D474 Osteomyelofibrosis: Principal | ICD-10-CM

## 2022-10-21 DIAGNOSIS — Z9484 Stem cells transplant status: Principal | ICD-10-CM

## 2022-10-21 DIAGNOSIS — D473 Essential (hemorrhagic) thrombocythemia: Principal | ICD-10-CM

## 2022-10-21 MED ORDER — TACROLIMUS 0.5 MG CAPSULE, IMMEDIATE-RELEASE
ORAL_CAPSULE | Freq: Two times a day (BID) | ORAL | 1 refills | 30 days | Status: CP
Start: 2022-10-21 — End: ?

## 2022-10-22 MED ORDER — SERTRALINE 25 MG TABLET
ORAL_TABLET | Freq: Every day | ORAL | 2 refills | 45 days | Status: CP
Start: 2022-10-22 — End: ?

## 2022-10-26 DIAGNOSIS — M6281 Muscle weakness (generalized): Secondary | ICD-10-CM | POA: Diagnosis not present

## 2022-10-26 DIAGNOSIS — Z9484 Stem cells transplant status: Secondary | ICD-10-CM | POA: Diagnosis not present

## 2022-10-26 DIAGNOSIS — D473 Essential (hemorrhagic) thrombocythemia: Secondary | ICD-10-CM | POA: Diagnosis not present

## 2022-10-26 DIAGNOSIS — M25511 Pain in right shoulder: Secondary | ICD-10-CM | POA: Diagnosis not present

## 2022-10-26 DIAGNOSIS — D474 Osteomyelofibrosis: Secondary | ICD-10-CM | POA: Diagnosis not present

## 2022-10-28 DIAGNOSIS — M6281 Muscle weakness (generalized): Secondary | ICD-10-CM | POA: Diagnosis not present

## 2022-10-28 DIAGNOSIS — M25511 Pain in right shoulder: Secondary | ICD-10-CM | POA: Diagnosis not present

## 2022-10-29 DIAGNOSIS — M9902 Segmental and somatic dysfunction of thoracic region: Secondary | ICD-10-CM | POA: Diagnosis not present

## 2022-10-29 DIAGNOSIS — M7541 Impingement syndrome of right shoulder: Secondary | ICD-10-CM | POA: Diagnosis not present

## 2022-10-29 DIAGNOSIS — M9901 Segmental and somatic dysfunction of cervical region: Secondary | ICD-10-CM | POA: Diagnosis not present

## 2022-10-29 DIAGNOSIS — M9907 Segmental and somatic dysfunction of upper extremity: Secondary | ICD-10-CM | POA: Diagnosis not present

## 2022-11-02 DIAGNOSIS — M25511 Pain in right shoulder: Secondary | ICD-10-CM | POA: Diagnosis not present

## 2022-11-02 DIAGNOSIS — M6281 Muscle weakness (generalized): Secondary | ICD-10-CM | POA: Diagnosis not present

## 2022-11-03 ENCOUNTER — Ambulatory Visit
Admit: 2022-11-03 | Discharge: 2022-11-03 | Payer: PRIVATE HEALTH INSURANCE | Attending: Pharmacist Clinician (PhC)/ Clinical Pharmacy Specialist | Primary: Pharmacist Clinician (PhC)/ Clinical Pharmacy Specialist

## 2022-11-03 ENCOUNTER — Other Ambulatory Visit: Admit: 2022-11-03 | Discharge: 2022-11-03 | Payer: PRIVATE HEALTH INSURANCE

## 2022-11-03 ENCOUNTER — Ambulatory Visit: Admit: 2022-11-03 | Discharge: 2022-11-03 | Payer: PRIVATE HEALTH INSURANCE

## 2022-11-03 DIAGNOSIS — Z9484 Stem cells transplant status: Principal | ICD-10-CM

## 2022-11-03 DIAGNOSIS — Z23 Encounter for immunization: Principal | ICD-10-CM

## 2022-11-03 DIAGNOSIS — D474 Osteomyelofibrosis: Principal | ICD-10-CM

## 2022-11-03 DIAGNOSIS — D473 Essential (hemorrhagic) thrombocythemia: Principal | ICD-10-CM

## 2022-11-03 DIAGNOSIS — D84822 Immunodeficiency due to external causes: Secondary | ICD-10-CM | POA: Diagnosis not present

## 2022-11-03 DIAGNOSIS — D89813 Graft-versus-host disease, unspecified: Secondary | ICD-10-CM | POA: Diagnosis not present

## 2022-11-03 MED ORDER — MAGNESIUM OXIDE-MAGNESIUM AMINO ACID CHELATE 133 MG TABLET
ORAL_TABLET | Freq: Three times a day (TID) | ORAL | 1 refills | 89 days | Status: CP
Start: 2022-11-03 — End: 2023-03-03
  Filled 2022-11-04: qty 300, 34d supply, fill #0

## 2022-11-11 DIAGNOSIS — M6281 Muscle weakness (generalized): Secondary | ICD-10-CM | POA: Diagnosis not present

## 2022-11-11 DIAGNOSIS — M25511 Pain in right shoulder: Secondary | ICD-10-CM | POA: Diagnosis not present

## 2022-11-13 DIAGNOSIS — M25511 Pain in right shoulder: Secondary | ICD-10-CM | POA: Diagnosis not present

## 2022-11-13 DIAGNOSIS — M6281 Muscle weakness (generalized): Secondary | ICD-10-CM | POA: Diagnosis not present

## 2022-11-17 ENCOUNTER — Ambulatory Visit
Admit: 2022-11-17 | Discharge: 2022-11-18 | Payer: PRIVATE HEALTH INSURANCE | Attending: Nurse Practitioner | Primary: Nurse Practitioner

## 2022-11-17 ENCOUNTER — Ambulatory Visit: Admit: 2022-11-17 | Discharge: 2022-11-18 | Payer: PRIVATE HEALTH INSURANCE

## 2022-11-17 ENCOUNTER — Other Ambulatory Visit: Admit: 2022-11-17 | Discharge: 2022-11-18 | Payer: PRIVATE HEALTH INSURANCE

## 2022-11-17 DIAGNOSIS — D473 Essential (hemorrhagic) thrombocythemia: Principal | ICD-10-CM

## 2022-11-17 DIAGNOSIS — Z9484 Stem cells transplant status: Principal | ICD-10-CM

## 2022-11-17 DIAGNOSIS — D84822 Immunocompromised state associated with stem cell transplant (CMS-HCC): Principal | ICD-10-CM

## 2022-11-17 DIAGNOSIS — D474 Osteomyelofibrosis: Principal | ICD-10-CM

## 2022-11-17 DIAGNOSIS — D89813 Graft-versus-host disease, unspecified: Principal | ICD-10-CM

## 2022-11-17 DIAGNOSIS — D7581 Myelofibrosis: Secondary | ICD-10-CM | POA: Diagnosis not present

## 2022-11-17 DIAGNOSIS — Z79621 Long term (current) use of calcineurin inhibitor: Secondary | ICD-10-CM | POA: Diagnosis not present

## 2022-11-17 DIAGNOSIS — M7989 Other specified soft tissue disorders: Secondary | ICD-10-CM | POA: Diagnosis not present

## 2022-11-17 DIAGNOSIS — M549 Dorsalgia, unspecified: Secondary | ICD-10-CM | POA: Diagnosis not present

## 2022-11-17 DIAGNOSIS — R7401 Elevation of levels of liver transaminase levels: Secondary | ICD-10-CM | POA: Diagnosis not present

## 2022-11-17 DIAGNOSIS — R7402 Elevation of levels of lactic acid dehydrogenase (LDH): Secondary | ICD-10-CM | POA: Diagnosis not present

## 2022-11-17 DIAGNOSIS — D7589 Other specified diseases of blood and blood-forming organs: Secondary | ICD-10-CM | POA: Diagnosis not present

## 2022-11-17 DIAGNOSIS — G4709 Other insomnia: Secondary | ICD-10-CM | POA: Diagnosis not present

## 2022-11-17 DIAGNOSIS — M25511 Pain in right shoulder: Secondary | ICD-10-CM | POA: Diagnosis not present

## 2022-11-17 DIAGNOSIS — M6281 Muscle weakness (generalized): Secondary | ICD-10-CM | POA: Diagnosis not present

## 2022-11-18 DIAGNOSIS — M25511 Pain in right shoulder: Secondary | ICD-10-CM | POA: Diagnosis not present

## 2022-11-18 DIAGNOSIS — M6281 Muscle weakness (generalized): Secondary | ICD-10-CM | POA: Diagnosis not present

## 2022-11-19 DIAGNOSIS — M25511 Pain in right shoulder: Secondary | ICD-10-CM | POA: Diagnosis not present

## 2022-11-19 DIAGNOSIS — M6281 Muscle weakness (generalized): Secondary | ICD-10-CM | POA: Diagnosis not present

## 2022-11-26 DIAGNOSIS — M25511 Pain in right shoulder: Secondary | ICD-10-CM | POA: Diagnosis not present

## 2022-11-26 DIAGNOSIS — M6281 Muscle weakness (generalized): Secondary | ICD-10-CM | POA: Diagnosis not present

## 2022-11-27 DIAGNOSIS — M6281 Muscle weakness (generalized): Secondary | ICD-10-CM | POA: Diagnosis not present

## 2022-11-27 DIAGNOSIS — M25511 Pain in right shoulder: Secondary | ICD-10-CM | POA: Diagnosis not present

## 2022-12-01 ENCOUNTER — Other Ambulatory Visit: Admit: 2022-12-01 | Discharge: 2022-12-02 | Payer: PRIVATE HEALTH INSURANCE

## 2022-12-01 ENCOUNTER — Ambulatory Visit
Admit: 2022-12-01 | Discharge: 2022-12-02 | Payer: PRIVATE HEALTH INSURANCE | Attending: Critical Care Medicine | Primary: Critical Care Medicine

## 2022-12-01 ENCOUNTER — Ambulatory Visit: Admit: 2022-12-01 | Discharge: 2022-12-02 | Payer: PRIVATE HEALTH INSURANCE

## 2022-12-01 DIAGNOSIS — D474 Osteomyelofibrosis: Principal | ICD-10-CM

## 2022-12-01 DIAGNOSIS — D473 Essential (hemorrhagic) thrombocythemia: Principal | ICD-10-CM

## 2022-12-01 DIAGNOSIS — Z5181 Encounter for therapeutic drug level monitoring: Principal | ICD-10-CM

## 2022-12-01 DIAGNOSIS — D89813 Graft-versus-host disease, unspecified: Principal | ICD-10-CM

## 2022-12-01 DIAGNOSIS — D84822 Immunocompromised state associated with stem cell transplant (CMS-HCC): Principal | ICD-10-CM

## 2022-12-01 DIAGNOSIS — Z9484 Stem cells transplant status: Principal | ICD-10-CM

## 2022-12-01 DIAGNOSIS — R7401 Elevation of levels of liver transaminase levels: Secondary | ICD-10-CM | POA: Diagnosis not present

## 2022-12-01 DIAGNOSIS — M25511 Pain in right shoulder: Secondary | ICD-10-CM | POA: Diagnosis not present

## 2022-12-01 DIAGNOSIS — Z7952 Long term (current) use of systemic steroids: Secondary | ICD-10-CM | POA: Diagnosis not present

## 2022-12-01 DIAGNOSIS — R7402 Elevation of levels of lactic acid dehydrogenase (LDH): Secondary | ICD-10-CM | POA: Diagnosis not present

## 2022-12-01 IMAGING — US US ABDOMEN LIMITED
1 series · 14 of 25 positions shown · non-contrast
Comparison: November 22, 2015

CLINICAL DATA: Splenomegaly with myelofibrosis transformed from
essential thrombocytopenia.

EXAM:
ULTRASOUND ABDOMEN LIMITED

[Series 1: us abdomen limited · 0.33mm/px · 14 of 27 slices shown]
[im 1/27]
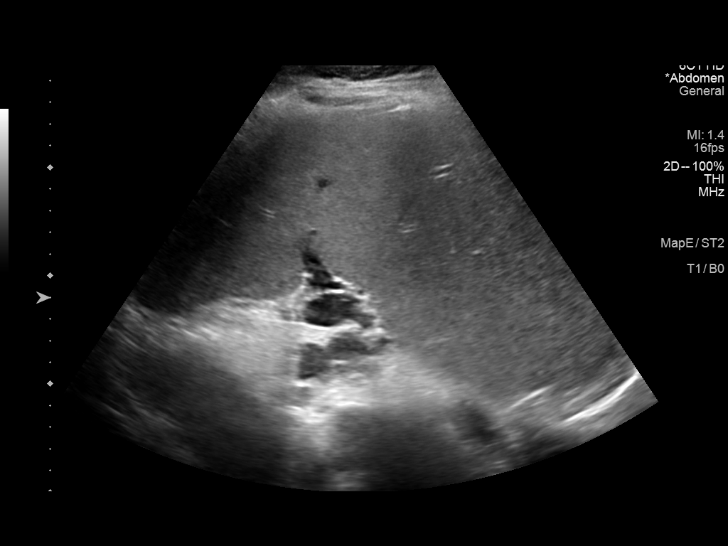
[im 3/27]
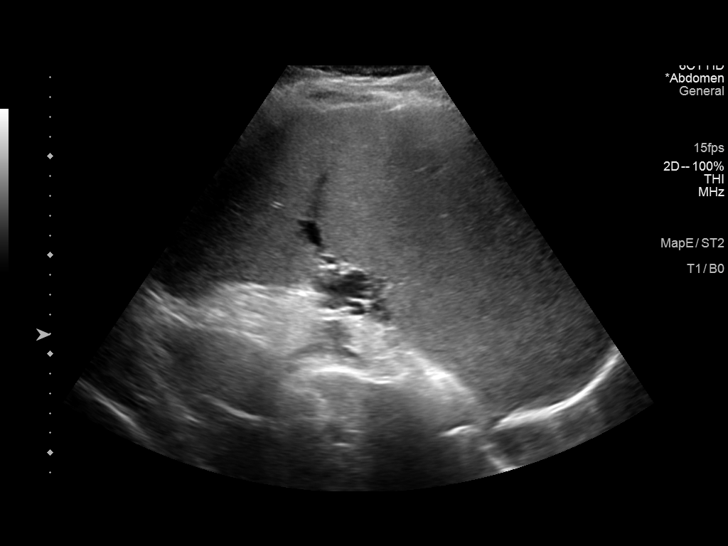
[im 5/27]
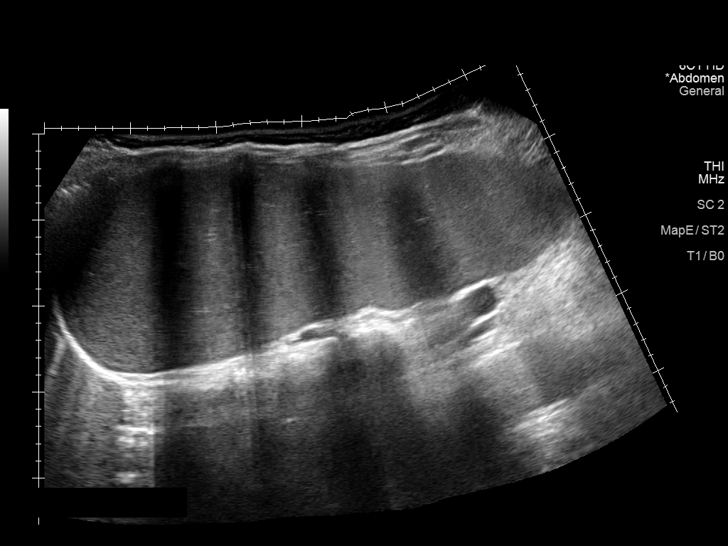
[im 7/27]
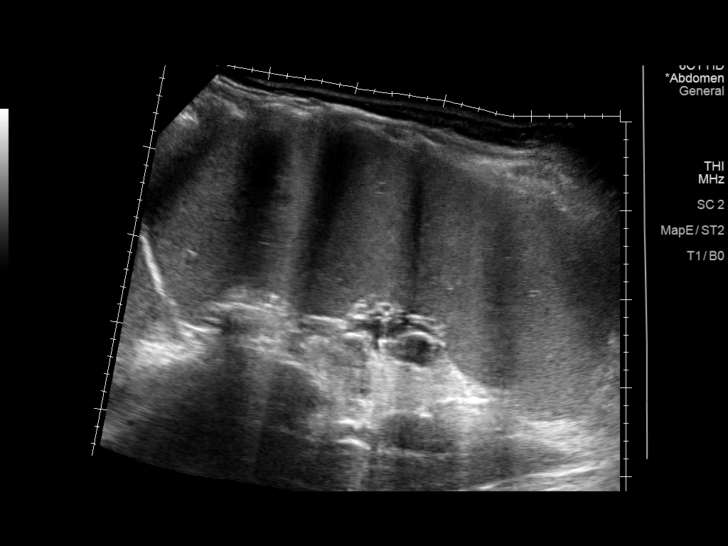
[im 9/27]
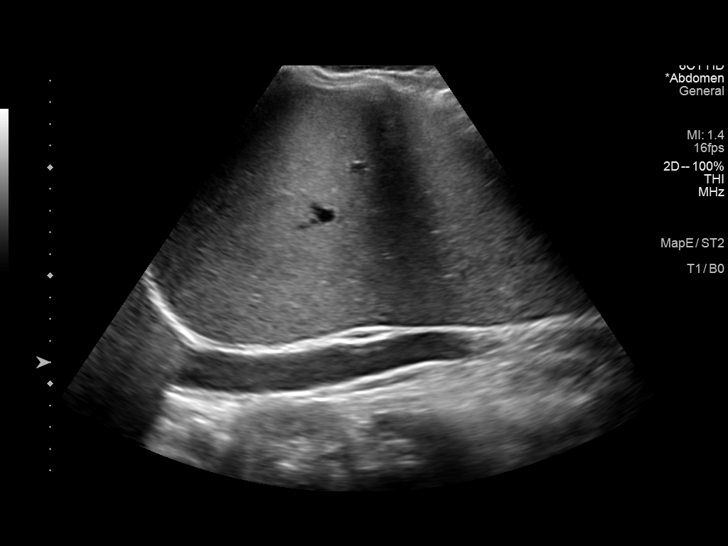
[im 10/27]
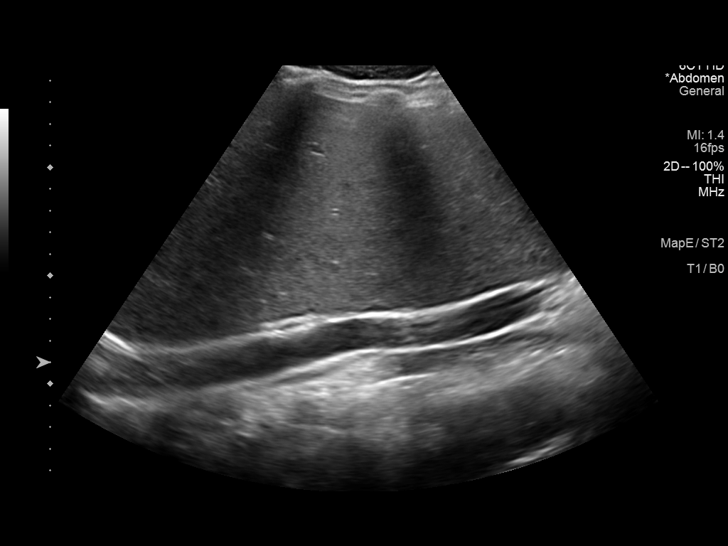
[im 12/27]
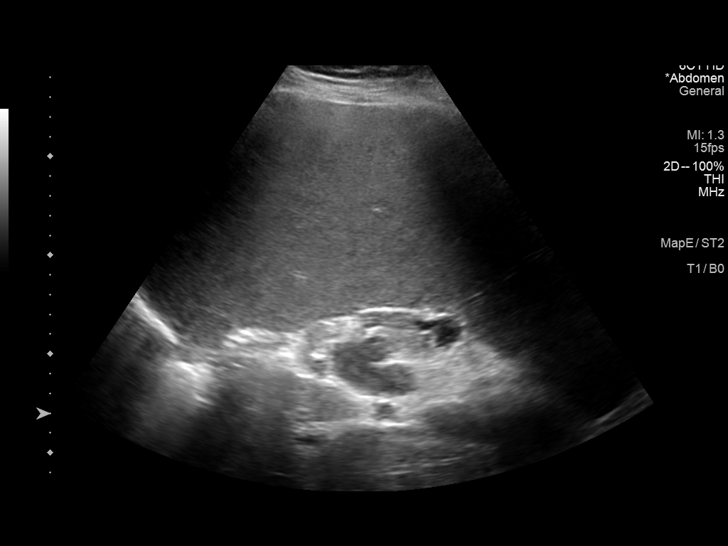
[im 15/27]
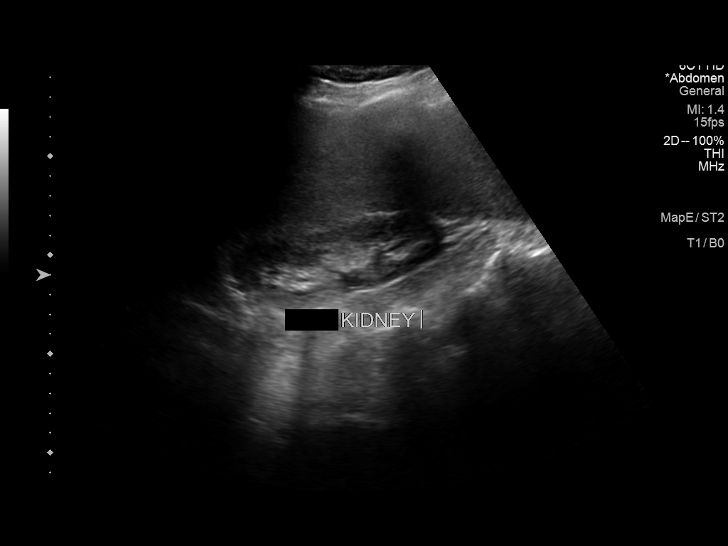
[im 17/27]
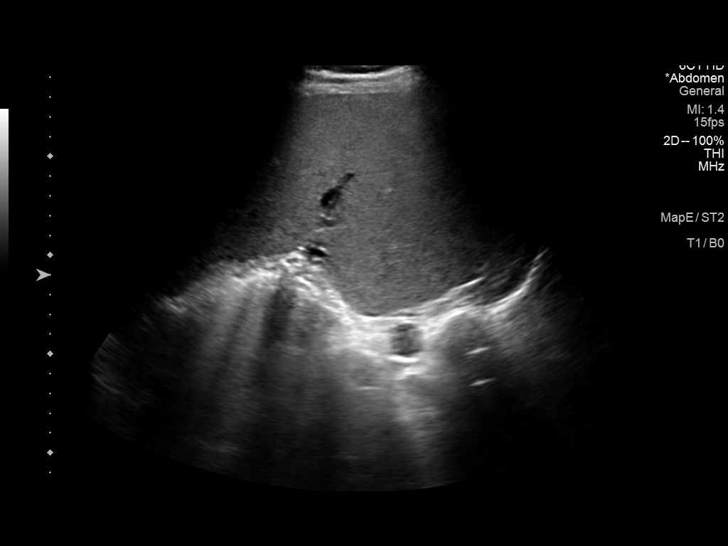
[im 18/27]
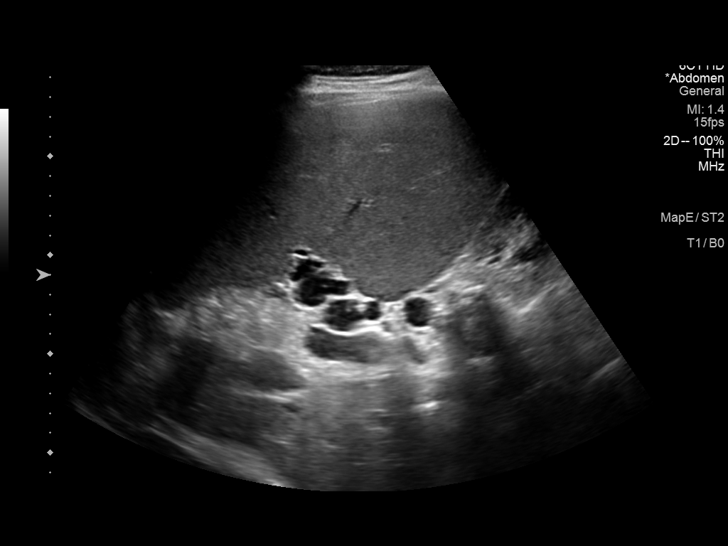
[im 20/27]
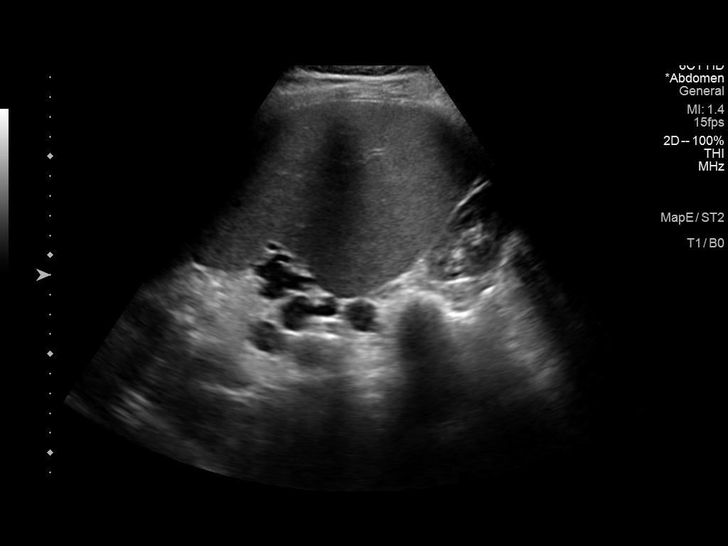
[im 22/27]
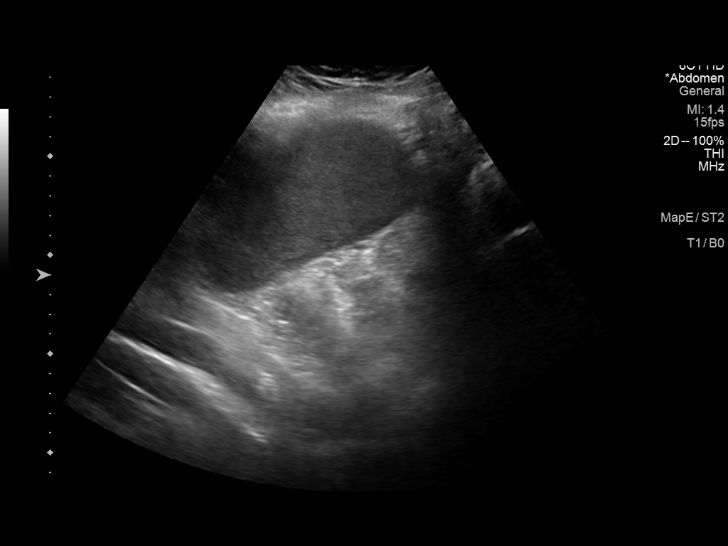
[im 24/27]
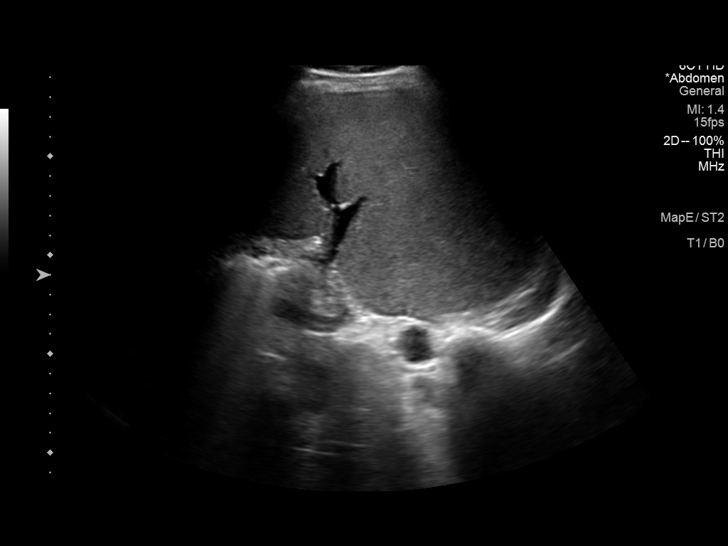
[im 27/27]
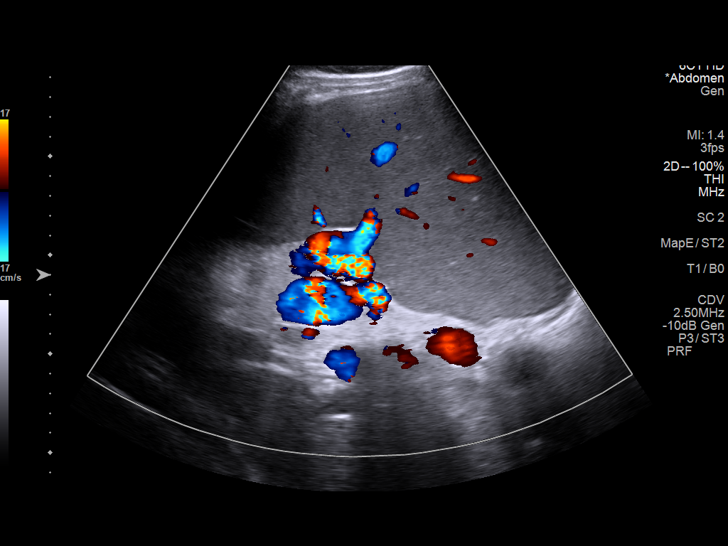

[14 of 25 positions shown; findings below may reference images not displayed]

FINDINGS: The spleen is enlarged and measures 29.6 cm x 9.8 cm x 23.6 cm
(splenic volume of 3587.8 mL).

The splenic parenchyma is normal in appearance without evidence of
solid or cystic lesions.
IMPRESSION: Splenomegaly without focal splenic lesions.

## 2022-12-01 MED ORDER — PANTOPRAZOLE 40 MG TABLET,DELAYED RELEASE
ORAL_TABLET | Freq: Every day | ORAL | 1 refills | 180.00000 days | Status: CP
Start: 2022-12-01 — End: 2022-12-01

## 2022-12-01 MED ORDER — DEXAMETHASONE 0.5 MG/5 ML ORAL SOLUTION
Freq: Three times a day (TID) | ORAL | 3 refills | 8 days | Status: CP
Start: 2022-12-01 — End: ?

## 2022-12-02 DIAGNOSIS — M25511 Pain in right shoulder: Secondary | ICD-10-CM | POA: Diagnosis not present

## 2022-12-02 DIAGNOSIS — M6281 Muscle weakness (generalized): Secondary | ICD-10-CM | POA: Diagnosis not present

## 2022-12-04 DIAGNOSIS — M6281 Muscle weakness (generalized): Secondary | ICD-10-CM | POA: Diagnosis not present

## 2022-12-04 DIAGNOSIS — M25511 Pain in right shoulder: Secondary | ICD-10-CM | POA: Diagnosis not present

## 2022-12-07 DIAGNOSIS — D473 Essential (hemorrhagic) thrombocythemia: Principal | ICD-10-CM

## 2022-12-07 DIAGNOSIS — Z9484 Stem cells transplant status: Principal | ICD-10-CM

## 2022-12-07 DIAGNOSIS — D474 Osteomyelofibrosis: Principal | ICD-10-CM

## 2022-12-07 MED ORDER — TACROLIMUS 0.5 MG CAPSULE, IMMEDIATE-RELEASE
ORAL_CAPSULE | 1 refills | 0 days | Status: CP
Start: 2022-12-07 — End: ?

## 2022-12-09 DIAGNOSIS — Z9484 Stem cells transplant status: Principal | ICD-10-CM

## 2022-12-09 DIAGNOSIS — D473 Essential (hemorrhagic) thrombocythemia: Principal | ICD-10-CM

## 2022-12-09 DIAGNOSIS — D474 Osteomyelofibrosis: Principal | ICD-10-CM

## 2022-12-09 DIAGNOSIS — M6281 Muscle weakness (generalized): Secondary | ICD-10-CM | POA: Diagnosis not present

## 2022-12-09 DIAGNOSIS — M25511 Pain in right shoulder: Secondary | ICD-10-CM | POA: Diagnosis not present

## 2022-12-09 MED ORDER — PREDNISONE 5 MG TABLET
ORAL_TABLET | 1 refills | 0 days
Start: 2022-12-09 — End: ?

## 2022-12-14 DIAGNOSIS — D474 Osteomyelofibrosis: Principal | ICD-10-CM

## 2022-12-14 DIAGNOSIS — D473 Essential (hemorrhagic) thrombocythemia: Principal | ICD-10-CM

## 2022-12-14 DIAGNOSIS — M6281 Muscle weakness (generalized): Secondary | ICD-10-CM | POA: Diagnosis not present

## 2022-12-14 DIAGNOSIS — M25511 Pain in right shoulder: Secondary | ICD-10-CM | POA: Diagnosis not present

## 2022-12-14 MED ORDER — SERTRALINE 25 MG TABLET
ORAL_TABLET | Freq: Every day | ORAL | 2 refills | 90 days | Status: CP
Start: 2022-12-14 — End: ?

## 2022-12-15 ENCOUNTER — Other Ambulatory Visit: Admit: 2022-12-15 | Discharge: 2022-12-16 | Payer: PRIVATE HEALTH INSURANCE

## 2022-12-15 ENCOUNTER — Ambulatory Visit: Admit: 2022-12-15 | Discharge: 2022-12-16 | Payer: PRIVATE HEALTH INSURANCE

## 2022-12-15 DIAGNOSIS — D474 Osteomyelofibrosis: Principal | ICD-10-CM

## 2022-12-15 DIAGNOSIS — D473 Essential (hemorrhagic) thrombocythemia: Principal | ICD-10-CM

## 2022-12-15 DIAGNOSIS — Z9484 Stem cells transplant status: Principal | ICD-10-CM

## 2022-12-15 DIAGNOSIS — F419 Anxiety disorder, unspecified: Secondary | ICD-10-CM | POA: Diagnosis not present

## 2022-12-15 DIAGNOSIS — R21 Rash and other nonspecific skin eruption: Secondary | ICD-10-CM | POA: Diagnosis not present

## 2022-12-15 DIAGNOSIS — M25511 Pain in right shoulder: Secondary | ICD-10-CM | POA: Diagnosis not present

## 2022-12-15 DIAGNOSIS — G4734 Idiopathic sleep related nonobstructive alveolar hypoventilation: Secondary | ICD-10-CM | POA: Diagnosis not present

## 2022-12-15 DIAGNOSIS — Z48298 Encounter for aftercare following other organ transplant: Secondary | ICD-10-CM | POA: Diagnosis not present

## 2022-12-16 DIAGNOSIS — M25511 Pain in right shoulder: Secondary | ICD-10-CM | POA: Diagnosis not present

## 2022-12-16 DIAGNOSIS — M6281 Muscle weakness (generalized): Secondary | ICD-10-CM | POA: Diagnosis not present

## 2022-12-21 DIAGNOSIS — D474 Osteomyelofibrosis: Principal | ICD-10-CM

## 2022-12-21 DIAGNOSIS — D473 Essential (hemorrhagic) thrombocythemia: Principal | ICD-10-CM

## 2022-12-23 DIAGNOSIS — M25511 Pain in right shoulder: Secondary | ICD-10-CM | POA: Diagnosis not present

## 2022-12-23 DIAGNOSIS — M6281 Muscle weakness (generalized): Secondary | ICD-10-CM | POA: Diagnosis not present

## 2022-12-24 DIAGNOSIS — M6281 Muscle weakness (generalized): Secondary | ICD-10-CM | POA: Diagnosis not present

## 2022-12-24 DIAGNOSIS — M25511 Pain in right shoulder: Secondary | ICD-10-CM | POA: Diagnosis not present

## 2022-12-28 DIAGNOSIS — M6281 Muscle weakness (generalized): Secondary | ICD-10-CM | POA: Diagnosis not present

## 2022-12-28 DIAGNOSIS — M25511 Pain in right shoulder: Secondary | ICD-10-CM | POA: Diagnosis not present

## 2022-12-29 ENCOUNTER — Ambulatory Visit: Admit: 2022-12-29 | Discharge: 2022-12-30 | Payer: PRIVATE HEALTH INSURANCE

## 2022-12-29 ENCOUNTER — Other Ambulatory Visit: Admit: 2022-12-29 | Discharge: 2022-12-30 | Payer: PRIVATE HEALTH INSURANCE

## 2022-12-29 ENCOUNTER — Ambulatory Visit: Admit: 2022-12-29 | Discharge: 2022-12-30 | Payer: PRIVATE HEALTH INSURANCE | Attending: Family | Primary: Family

## 2022-12-29 DIAGNOSIS — D474 Osteomyelofibrosis: Principal | ICD-10-CM

## 2022-12-29 DIAGNOSIS — D84822 Immunocompromised state associated with stem cell transplant (CMS-HCC): Principal | ICD-10-CM

## 2022-12-29 DIAGNOSIS — D473 Essential (hemorrhagic) thrombocythemia: Principal | ICD-10-CM

## 2022-12-29 DIAGNOSIS — Z9484 Stem cells transplant status: Principal | ICD-10-CM

## 2022-12-29 DIAGNOSIS — D89813 Graft-versus-host disease, unspecified: Principal | ICD-10-CM

## 2022-12-29 DIAGNOSIS — R112 Nausea with vomiting, unspecified: Principal | ICD-10-CM

## 2022-12-29 MED ORDER — VALACYCLOVIR 500 MG TABLET
ORAL_TABLET | Freq: Two times a day (BID) | ORAL | 11 refills | 30.00 days | Status: CP
Start: 2022-12-29 — End: ?

## 2022-12-29 MED ORDER — CALCIUM 600 MG (AS CARBONATE)-VITAMIN D3 20 MCG (800 UNIT) TABLET
ORAL_TABLET | Freq: Two times a day (BID) | ORAL | 3 refills | 0.00 days
Start: 2022-12-29 — End: 2023-01-28

## 2022-12-29 MED ORDER — DEXAMETHASONE 0.5 MG/5 ML ORAL SOLUTION
Freq: Three times a day (TID) | ORAL | 3 refills | 8.00 days | Status: CP
Start: 2022-12-29 — End: ?

## 2022-12-29 MED ORDER — LORAZEPAM 0.5 MG TABLET
ORAL_TABLET | Freq: Four times a day (QID) | ORAL | 0 refills | 8 days | Status: CP | PRN
Start: 2022-12-29 — End: ?

## 2022-12-29 MED ORDER — PREDNISONE 5 MG TABLET
ORAL_TABLET | 1 refills | 0.00 days
Start: 2022-12-29 — End: ?

## 2022-12-29 MED FILL — MG-PLUS-PROTEIN 133 MG TABLET: ORAL | 34 days supply | Qty: 300 | Fill #0

## 2022-12-30 DIAGNOSIS — M25511 Pain in right shoulder: Secondary | ICD-10-CM | POA: Diagnosis not present

## 2022-12-30 DIAGNOSIS — M6281 Muscle weakness (generalized): Secondary | ICD-10-CM | POA: Diagnosis not present

## 2023-01-07 DIAGNOSIS — Z9484 Stem cells transplant status: Principal | ICD-10-CM

## 2023-01-07 DIAGNOSIS — D474 Osteomyelofibrosis: Principal | ICD-10-CM

## 2023-01-07 DIAGNOSIS — D473 Essential (hemorrhagic) thrombocythemia: Principal | ICD-10-CM

## 2023-01-07 MED ORDER — CALCIUM 600 MG (AS CARBONATE)-VITAMIN D3 20 MCG (800 UNIT) TABLET
ORAL_TABLET | Freq: Two times a day (BID) | ORAL | 3 refills | 0.00 days | Status: CP
Start: 2023-01-07 — End: 2023-02-06
  Filled 2023-01-11: qty 60, 30d supply, fill #0

## 2023-01-08 DIAGNOSIS — Z6825 Body mass index (BMI) 25.0-25.9, adult: Secondary | ICD-10-CM | POA: Diagnosis not present

## 2023-01-08 DIAGNOSIS — D7581 Myelofibrosis: Secondary | ICD-10-CM | POA: Diagnosis not present

## 2023-01-08 DIAGNOSIS — J0111 Acute recurrent frontal sinusitis: Secondary | ICD-10-CM | POA: Diagnosis not present

## 2023-01-11 ENCOUNTER — Other Ambulatory Visit: Admit: 2023-01-11 | Discharge: 2023-01-11 | Payer: PRIVATE HEALTH INSURANCE

## 2023-01-11 ENCOUNTER — Ambulatory Visit
Admit: 2023-01-11 | Discharge: 2023-01-11 | Payer: PRIVATE HEALTH INSURANCE | Attending: Primary Care | Primary: Primary Care

## 2023-01-11 DIAGNOSIS — D89813 Graft-versus-host disease, unspecified: Principal | ICD-10-CM

## 2023-01-11 DIAGNOSIS — D84822 Immunocompromised state associated with stem cell transplant (CMS-HCC): Principal | ICD-10-CM

## 2023-01-11 DIAGNOSIS — D473 Essential (hemorrhagic) thrombocythemia: Principal | ICD-10-CM

## 2023-01-11 DIAGNOSIS — D474 Osteomyelofibrosis: Principal | ICD-10-CM

## 2023-01-11 DIAGNOSIS — Z9484 Stem cells transplant status: Principal | ICD-10-CM

## 2023-01-11 DIAGNOSIS — R7989 Other specified abnormal findings of blood chemistry: Principal | ICD-10-CM

## 2023-01-11 DIAGNOSIS — Z5181 Encounter for therapeutic drug level monitoring: Principal | ICD-10-CM

## 2023-01-11 DIAGNOSIS — D696 Thrombocytopenia, unspecified: Principal | ICD-10-CM

## 2023-01-11 DIAGNOSIS — R0981 Nasal congestion: Principal | ICD-10-CM

## 2023-01-11 DIAGNOSIS — Z48290 Encounter for aftercare following bone marrow transplant: Secondary | ICD-10-CM | POA: Diagnosis not present

## 2023-01-11 DIAGNOSIS — Z1152 Encounter for screening for COVID-19: Secondary | ICD-10-CM | POA: Diagnosis not present

## 2023-01-20 DIAGNOSIS — D473 Essential (hemorrhagic) thrombocythemia: Principal | ICD-10-CM

## 2023-01-20 DIAGNOSIS — B348 Other viral infections of unspecified site: Principal | ICD-10-CM

## 2023-01-20 DIAGNOSIS — D474 Osteomyelofibrosis: Principal | ICD-10-CM

## 2023-01-21 DIAGNOSIS — N941 Unspecified dyspareunia: Principal | ICD-10-CM

## 2023-01-22 ENCOUNTER — Inpatient Hospital Stay: Admit: 2023-01-22 | Discharge: 2023-01-23 | Payer: PRIVATE HEALTH INSURANCE

## 2023-01-22 ENCOUNTER — Ambulatory Visit: Admit: 2023-01-22 | Discharge: 2023-01-23 | Payer: PRIVATE HEALTH INSURANCE

## 2023-01-22 ENCOUNTER — Other Ambulatory Visit: Admit: 2023-01-22 | Discharge: 2023-01-23 | Payer: PRIVATE HEALTH INSURANCE

## 2023-01-22 DIAGNOSIS — D474 Osteomyelofibrosis: Principal | ICD-10-CM

## 2023-01-22 DIAGNOSIS — D696 Thrombocytopenia, unspecified: Principal | ICD-10-CM

## 2023-01-22 DIAGNOSIS — D473 Essential (hemorrhagic) thrombocythemia: Principal | ICD-10-CM

## 2023-01-22 DIAGNOSIS — B348 Other viral infections of unspecified site: Principal | ICD-10-CM

## 2023-01-22 DIAGNOSIS — Z9484 Stem cells transplant status: Principal | ICD-10-CM

## 2023-01-22 DIAGNOSIS — D89813 Graft-versus-host disease, unspecified: Principal | ICD-10-CM

## 2023-01-22 MED ORDER — MAGNESIUM OXIDE-MAGNESIUM AMINO ACID CHELATE 133 MG TABLET
ORAL_TABLET | 1 refills | 0.00 days | Status: CP
Start: 2023-01-22 — End: ?
  Filled 2023-02-03: qty 300, 37d supply, fill #0

## 2023-01-29 DIAGNOSIS — D474 Osteomyelofibrosis: Principal | ICD-10-CM

## 2023-01-29 DIAGNOSIS — Z9484 Stem cells transplant status: Principal | ICD-10-CM

## 2023-01-29 DIAGNOSIS — D473 Essential (hemorrhagic) thrombocythemia: Principal | ICD-10-CM

## 2023-01-29 MED ORDER — TACROLIMUS 0.5 MG CAPSULE, IMMEDIATE-RELEASE
ORAL_CAPSULE | 1 refills | 0.00 days | Status: CP
Start: 2023-01-29 — End: ?

## 2023-02-03 ENCOUNTER — Ambulatory Visit: Admit: 2023-02-03 | Discharge: 2023-02-04 | Payer: PRIVATE HEALTH INSURANCE

## 2023-02-03 ENCOUNTER — Other Ambulatory Visit: Admit: 2023-02-03 | Discharge: 2023-02-04 | Payer: PRIVATE HEALTH INSURANCE

## 2023-02-03 DIAGNOSIS — D474 Osteomyelofibrosis: Principal | ICD-10-CM

## 2023-02-03 DIAGNOSIS — Z9484 Stem cells transplant status: Principal | ICD-10-CM

## 2023-02-03 DIAGNOSIS — D473 Essential (hemorrhagic) thrombocythemia: Principal | ICD-10-CM

## 2023-02-03 DIAGNOSIS — D89813 Graft-versus-host disease, unspecified: Secondary | ICD-10-CM | POA: Diagnosis not present

## 2023-02-03 DIAGNOSIS — D84822 Immunodeficiency due to external causes: Secondary | ICD-10-CM | POA: Diagnosis not present

## 2023-02-03 MED FILL — CALCIUM 600 MG (AS CARBONATE)-VITAMIN D3 20 MCG (800 UNIT) TABLET: ORAL | 30 days supply | Qty: 60 | Fill #1

## 2023-02-04 ENCOUNTER — Other Ambulatory Visit: Payer: Self-pay | Admitting: Obstetrics and Gynecology

## 2023-02-04 ENCOUNTER — Ambulatory Visit
Admission: RE | Admit: 2023-02-04 | Discharge: 2023-02-04 | Disposition: A | Discharge status: Home / Self Care | Payer: 59 | Source: Ambulatory Visit | Attending: Obstetrics and Gynecology | Admitting: Obstetrics and Gynecology

## 2023-02-04 DIAGNOSIS — Z1231 Encounter for screening mammogram for malignant neoplasm of breast: Secondary | ICD-10-CM

## 2023-02-08 DIAGNOSIS — R300 Dysuria: Principal | ICD-10-CM

## 2023-02-08 DIAGNOSIS — D473 Essential (hemorrhagic) thrombocythemia: Principal | ICD-10-CM

## 2023-02-08 DIAGNOSIS — D474 Osteomyelofibrosis: Principal | ICD-10-CM

## 2023-02-09 DIAGNOSIS — D473 Essential (hemorrhagic) thrombocythemia: Principal | ICD-10-CM

## 2023-02-09 DIAGNOSIS — D84822 Immunocompromised state associated with stem cell transplant (CMS-HCC): Principal | ICD-10-CM

## 2023-02-09 DIAGNOSIS — D474 Osteomyelofibrosis: Principal | ICD-10-CM

## 2023-02-09 DIAGNOSIS — Z9484 Stem cells transplant status: Principal | ICD-10-CM

## 2023-02-09 DIAGNOSIS — N3000 Acute cystitis without hematuria: Principal | ICD-10-CM

## 2023-02-09 MED ORDER — NITROFURANTOIN MONOHYDRATE/MACROCRYSTALS 100 MG CAPSULE
ORAL_CAPSULE | Freq: Two times a day (BID) | ORAL | 0 refills | 7.00 days | Status: CP
Start: 2023-02-09 — End: 2023-02-16

## 2023-02-09 MED ORDER — AMOXICILLIN 500 MG CAPSULE
ORAL_CAPSULE | Freq: Once | ORAL | 0 refills | 1.00 days | Status: CP
Start: 2023-02-09 — End: 2023-02-09

## 2023-02-11 DIAGNOSIS — D474 Osteomyelofibrosis: Principal | ICD-10-CM

## 2023-02-11 DIAGNOSIS — D473 Essential (hemorrhagic) thrombocythemia: Principal | ICD-10-CM

## 2023-02-11 DIAGNOSIS — N390 Urinary tract infection, site not specified: Principal | ICD-10-CM

## 2023-02-11 MED ORDER — FOSFOMYCIN TROMETHAMINE 3 GRAM ORAL PACKET
PACK | ORAL | 0 refills | 0.00 days
Start: 2023-02-11 — End: 2023-02-11

## 2023-02-11 MED ORDER — CIPROFLOXACIN 500 MG TABLET
ORAL_TABLET | Freq: Two times a day (BID) | ORAL | 0 refills | 7.00 days | Status: CP
Start: 2023-02-11 — End: 2023-02-18

## 2023-02-12 DIAGNOSIS — D473 Essential (hemorrhagic) thrombocythemia: Principal | ICD-10-CM

## 2023-02-12 DIAGNOSIS — D474 Osteomyelofibrosis: Principal | ICD-10-CM

## 2023-02-12 MED ORDER — FOSFOMYCIN TROMETHAMINE 3 GRAM ORAL PACKET
Freq: Once | ORAL | 0 refills | 1.00 days | Status: CP
Start: 2023-02-12 — End: 2023-02-12

## 2023-02-15 DIAGNOSIS — D473 Essential (hemorrhagic) thrombocythemia: Principal | ICD-10-CM

## 2023-02-15 DIAGNOSIS — D474 Osteomyelofibrosis: Principal | ICD-10-CM

## 2023-02-16 DIAGNOSIS — D474 Osteomyelofibrosis: Principal | ICD-10-CM

## 2023-02-16 DIAGNOSIS — D473 Essential (hemorrhagic) thrombocythemia: Principal | ICD-10-CM

## 2023-02-16 DIAGNOSIS — R300 Dysuria: Principal | ICD-10-CM

## 2023-02-17 ENCOUNTER — Inpatient Hospital Stay: Admit: 2023-02-17 | Discharge: 2023-02-18 | Payer: PRIVATE HEALTH INSURANCE

## 2023-02-17 ENCOUNTER — Other Ambulatory Visit: Admit: 2023-02-17 | Discharge: 2023-02-18 | Payer: PRIVATE HEALTH INSURANCE

## 2023-02-17 ENCOUNTER — Ambulatory Visit
Admit: 2023-02-17 | Discharge: 2023-02-18 | Payer: PRIVATE HEALTH INSURANCE | Attending: Critical Care Medicine | Primary: Critical Care Medicine

## 2023-02-17 DIAGNOSIS — Z9484 Stem cells transplant status: Principal | ICD-10-CM

## 2023-02-17 DIAGNOSIS — R300 Dysuria: Principal | ICD-10-CM

## 2023-02-17 DIAGNOSIS — D474 Osteomyelofibrosis: Principal | ICD-10-CM

## 2023-02-17 DIAGNOSIS — D473 Essential (hemorrhagic) thrombocythemia: Principal | ICD-10-CM

## 2023-02-17 DIAGNOSIS — D89813 Graft-versus-host disease, unspecified: Principal | ICD-10-CM

## 2023-02-17 MED ORDER — PREDNISONE 5 MG TABLET
ORAL_TABLET | Freq: Every day | ORAL | 1 refills | 30.00 days
Start: 2023-02-17 — End: ?

## 2023-02-18 MED ORDER — PREDNISONE 5 MG TABLET
ORAL_TABLET | Freq: Every day | ORAL | 1 refills | 30.00 days
Start: 2023-02-18 — End: ?

## 2023-02-22 DIAGNOSIS — Z7689 Persons encountering health services in other specified circumstances: Secondary | ICD-10-CM | POA: Diagnosis not present

## 2023-02-23 DIAGNOSIS — M79671 Pain in right foot: Secondary | ICD-10-CM | POA: Diagnosis not present

## 2023-02-23 DIAGNOSIS — M79672 Pain in left foot: Secondary | ICD-10-CM | POA: Diagnosis not present

## 2023-02-26 DIAGNOSIS — D473 Essential (hemorrhagic) thrombocythemia: Principal | ICD-10-CM

## 2023-02-26 DIAGNOSIS — D474 Osteomyelofibrosis: Principal | ICD-10-CM

## 2023-03-02 DIAGNOSIS — D474 Osteomyelofibrosis: Principal | ICD-10-CM

## 2023-03-02 DIAGNOSIS — D473 Essential (hemorrhagic) thrombocythemia: Principal | ICD-10-CM

## 2023-03-02 DIAGNOSIS — M79672 Pain in left foot: Secondary | ICD-10-CM | POA: Diagnosis not present

## 2023-03-02 DIAGNOSIS — M79671 Pain in right foot: Secondary | ICD-10-CM | POA: Diagnosis not present

## 2023-03-08 ENCOUNTER — Other Ambulatory Visit: Admit: 2023-03-08 | Discharge: 2023-03-08 | Payer: PRIVATE HEALTH INSURANCE

## 2023-03-08 ENCOUNTER — Ambulatory Visit
Admit: 2023-03-08 | Discharge: 2023-03-08 | Payer: PRIVATE HEALTH INSURANCE | Attending: Primary Care | Primary: Primary Care

## 2023-03-08 DIAGNOSIS — Z5181 Encounter for therapeutic drug level monitoring: Principal | ICD-10-CM

## 2023-03-08 DIAGNOSIS — N898 Other specified noninflammatory disorders of vagina: Principal | ICD-10-CM

## 2023-03-08 DIAGNOSIS — Z9484 Stem cells transplant status: Principal | ICD-10-CM

## 2023-03-08 DIAGNOSIS — D473 Essential (hemorrhagic) thrombocythemia: Principal | ICD-10-CM

## 2023-03-08 DIAGNOSIS — D474 Osteomyelofibrosis: Principal | ICD-10-CM

## 2023-03-08 DIAGNOSIS — N390 Urinary tract infection, site not specified: Principal | ICD-10-CM

## 2023-03-08 DIAGNOSIS — Z719 Counseling, unspecified: Principal | ICD-10-CM

## 2023-03-08 DIAGNOSIS — R7989 Other specified abnormal findings of blood chemistry: Principal | ICD-10-CM

## 2023-03-08 DIAGNOSIS — D84822 Immunocompromised state associated with stem cell transplant (CMS-HCC): Principal | ICD-10-CM

## 2023-03-08 DIAGNOSIS — D89813 Graft-versus-host disease, unspecified: Principal | ICD-10-CM

## 2023-03-08 DIAGNOSIS — D696 Thrombocytopenia, unspecified: Principal | ICD-10-CM

## 2023-03-08 MED ORDER — PREDNISONE 5 MG TABLET
ORAL_TABLET | Freq: Every day | ORAL | 1 refills | 45.00 days | Status: CP
Start: 2023-03-08 — End: ?

## 2023-03-08 MED FILL — CALCIUM 600 MG (AS CARBONATE)-VITAMIN D3 20 MCG (800 UNIT) TABLET: ORAL | 30 days supply | Qty: 60 | Fill #2

## 2023-03-09 DIAGNOSIS — D474 Osteomyelofibrosis: Principal | ICD-10-CM

## 2023-03-09 DIAGNOSIS — D473 Essential (hemorrhagic) thrombocythemia: Principal | ICD-10-CM

## 2023-03-11 DIAGNOSIS — M79645 Pain in left finger(s): Secondary | ICD-10-CM | POA: Diagnosis not present

## 2023-03-11 DIAGNOSIS — M79644 Pain in right finger(s): Secondary | ICD-10-CM | POA: Diagnosis not present

## 2023-03-11 DIAGNOSIS — M25561 Pain in right knee: Secondary | ICD-10-CM | POA: Diagnosis not present

## 2023-03-11 DIAGNOSIS — M25562 Pain in left knee: Secondary | ICD-10-CM | POA: Diagnosis not present

## 2023-03-11 DIAGNOSIS — M25649 Stiffness of unspecified hand, not elsewhere classified: Secondary | ICD-10-CM | POA: Diagnosis not present

## 2023-03-11 DIAGNOSIS — G8929 Other chronic pain: Secondary | ICD-10-CM | POA: Diagnosis not present

## 2023-03-11 DIAGNOSIS — M25551 Pain in right hip: Secondary | ICD-10-CM | POA: Diagnosis not present

## 2023-03-17 DIAGNOSIS — Z9484 Stem cells transplant status: Secondary | ICD-10-CM | POA: Diagnosis not present

## 2023-03-17 DIAGNOSIS — D473 Essential (hemorrhagic) thrombocythemia: Secondary | ICD-10-CM | POA: Diagnosis not present

## 2023-03-17 DIAGNOSIS — D474 Osteomyelofibrosis: Secondary | ICD-10-CM | POA: Diagnosis not present

## 2023-03-24 DIAGNOSIS — D474 Osteomyelofibrosis: Principal | ICD-10-CM

## 2023-03-24 DIAGNOSIS — Z9484 Stem cells transplant status: Principal | ICD-10-CM

## 2023-03-24 DIAGNOSIS — D473 Essential (hemorrhagic) thrombocythemia: Principal | ICD-10-CM

## 2023-03-24 MED ORDER — TACROLIMUS 0.5 MG CAPSULE, IMMEDIATE-RELEASE
ORAL_CAPSULE | 1 refills | 0.00 days | Status: CP
Start: 2023-03-24 — End: ?

## 2023-03-25 ENCOUNTER — Other Ambulatory Visit: Admit: 2023-03-25 | Discharge: 2023-03-26 | Payer: PRIVATE HEALTH INSURANCE

## 2023-03-25 ENCOUNTER — Inpatient Hospital Stay: Admit: 2023-03-25 | Discharge: 2023-03-26 | Payer: PRIVATE HEALTH INSURANCE

## 2023-03-25 ENCOUNTER — Ambulatory Visit
Admit: 2023-03-25 | Discharge: 2023-03-26 | Payer: PRIVATE HEALTH INSURANCE | Attending: Primary Care | Primary: Primary Care

## 2023-03-25 DIAGNOSIS — Z9484 Stem cells transplant status: Principal | ICD-10-CM

## 2023-03-25 DIAGNOSIS — G620 Drug-induced polyneuropathy: Principal | ICD-10-CM

## 2023-03-25 DIAGNOSIS — D696 Thrombocytopenia, unspecified: Principal | ICD-10-CM

## 2023-03-25 DIAGNOSIS — Z5181 Encounter for therapeutic drug level monitoring: Principal | ICD-10-CM

## 2023-03-25 DIAGNOSIS — D473 Essential (hemorrhagic) thrombocythemia: Principal | ICD-10-CM

## 2023-03-25 DIAGNOSIS — R21 Rash and other nonspecific skin eruption: Principal | ICD-10-CM

## 2023-03-25 DIAGNOSIS — D89813 Graft-versus-host disease, unspecified: Principal | ICD-10-CM

## 2023-03-25 DIAGNOSIS — D229 Melanocytic nevi, unspecified: Principal | ICD-10-CM

## 2023-03-25 DIAGNOSIS — Z719 Counseling, unspecified: Principal | ICD-10-CM

## 2023-03-25 DIAGNOSIS — D474 Osteomyelofibrosis: Principal | ICD-10-CM

## 2023-03-25 DIAGNOSIS — R7989 Other specified abnormal findings of blood chemistry: Principal | ICD-10-CM

## 2023-03-25 DIAGNOSIS — D84822 Immunocompromised state associated with stem cell transplant (CMS-HCC): Principal | ICD-10-CM

## 2023-03-25 DIAGNOSIS — T451X5A Adverse effect of antineoplastic and immunosuppressive drugs, initial encounter: Principal | ICD-10-CM

## 2023-03-25 MED ORDER — DESONIDE 0.05 % TOPICAL CREAM
Freq: Two times a day (BID) | TOPICAL | 3 refills | 0.00 days | Status: CP
Start: 2023-03-25 — End: 2023-04-08

## 2023-03-27 DIAGNOSIS — D473 Essential (hemorrhagic) thrombocythemia: Principal | ICD-10-CM

## 2023-03-27 DIAGNOSIS — D474 Osteomyelofibrosis: Principal | ICD-10-CM

## 2023-03-27 MED ORDER — VALACYCLOVIR 500 MG TABLET
ORAL_TABLET | Freq: Two times a day (BID) | ORAL | 11 refills | 30.00 days
Start: 2023-03-27 — End: ?

## 2023-03-29 MED ORDER — VALACYCLOVIR 500 MG TABLET
ORAL_TABLET | Freq: Two times a day (BID) | ORAL | 11 refills | 30.00 days | Status: CP
Start: 2023-03-29 — End: ?

## 2023-03-30 DIAGNOSIS — Q828 Other specified congenital malformations of skin: Secondary | ICD-10-CM | POA: Diagnosis not present

## 2023-03-30 DIAGNOSIS — D225 Melanocytic nevi of trunk: Secondary | ICD-10-CM | POA: Diagnosis not present

## 2023-03-30 DIAGNOSIS — D485 Neoplasm of uncertain behavior of skin: Secondary | ICD-10-CM | POA: Diagnosis not present

## 2023-03-30 DIAGNOSIS — L309 Dermatitis, unspecified: Secondary | ICD-10-CM | POA: Diagnosis not present

## 2023-03-30 DIAGNOSIS — L720 Epidermal cyst: Secondary | ICD-10-CM | POA: Diagnosis not present

## 2023-03-31 ENCOUNTER — Inpatient Hospital Stay: Admit: 2023-03-31 | Discharge: 2023-03-31 | Payer: PRIVATE HEALTH INSURANCE

## 2023-03-31 ENCOUNTER — Ambulatory Visit: Admit: 2023-03-31 | Discharge: 2023-03-31 | Payer: PRIVATE HEALTH INSURANCE

## 2023-03-31 DIAGNOSIS — Z9484 Stem cells transplant status: Principal | ICD-10-CM

## 2023-03-31 DIAGNOSIS — G6289 Other specified polyneuropathies: Principal | ICD-10-CM

## 2023-03-31 DIAGNOSIS — D473 Essential (hemorrhagic) thrombocythemia: Principal | ICD-10-CM

## 2023-03-31 DIAGNOSIS — D474 Osteomyelofibrosis: Principal | ICD-10-CM

## 2023-03-31 MED FILL — MG-PLUS-PROTEIN 133 MG TABLET: 37 days supply | Qty: 300 | Fill #1

## 2023-03-31 MED FILL — CALCIUM 600 MG (AS CARBONATE)-VITAMIN D3 20 MCG (800 UNIT) TABLET: ORAL | 30 days supply | Qty: 60 | Fill #3

## 2023-04-01 DIAGNOSIS — D473 Essential (hemorrhagic) thrombocythemia: Principal | ICD-10-CM

## 2023-04-01 DIAGNOSIS — D474 Osteomyelofibrosis: Principal | ICD-10-CM

## 2023-04-01 MED ORDER — PANTOPRAZOLE 40 MG TABLET,DELAYED RELEASE
ORAL_TABLET | Freq: Two times a day (BID) | ORAL | 1 refills | 90.00 days
Start: 2023-04-01 — End: ?

## 2023-04-07 ENCOUNTER — Ambulatory Visit
Admit: 2023-04-07 | Discharge: 2023-04-08 | Payer: PRIVATE HEALTH INSURANCE | Attending: Critical Care Medicine | Primary: Critical Care Medicine

## 2023-04-07 ENCOUNTER — Other Ambulatory Visit: Admit: 2023-04-07 | Discharge: 2023-04-08 | Payer: PRIVATE HEALTH INSURANCE

## 2023-04-07 DIAGNOSIS — R7989 Other specified abnormal findings of blood chemistry: Principal | ICD-10-CM

## 2023-04-07 DIAGNOSIS — D473 Essential (hemorrhagic) thrombocythemia: Principal | ICD-10-CM

## 2023-04-07 DIAGNOSIS — D474 Osteomyelofibrosis: Principal | ICD-10-CM

## 2023-04-07 DIAGNOSIS — N898 Other specified noninflammatory disorders of vagina: Principal | ICD-10-CM

## 2023-04-07 DIAGNOSIS — Z9484 Stem cells transplant status: Principal | ICD-10-CM

## 2023-04-07 DIAGNOSIS — D89813 Graft-versus-host disease, unspecified: Principal | ICD-10-CM

## 2023-04-07 DIAGNOSIS — R7402 Elevation of levels of lactic acid dehydrogenase (LDH): Secondary | ICD-10-CM | POA: Diagnosis not present

## 2023-04-07 DIAGNOSIS — R21 Rash and other nonspecific skin eruption: Secondary | ICD-10-CM | POA: Diagnosis not present

## 2023-04-07 DIAGNOSIS — M79641 Pain in right hand: Secondary | ICD-10-CM | POA: Diagnosis not present

## 2023-04-07 DIAGNOSIS — M79671 Pain in right foot: Secondary | ICD-10-CM | POA: Diagnosis not present

## 2023-04-07 DIAGNOSIS — Z7952 Long term (current) use of systemic steroids: Secondary | ICD-10-CM | POA: Diagnosis not present

## 2023-04-07 DIAGNOSIS — M25551 Pain in right hip: Secondary | ICD-10-CM | POA: Diagnosis not present

## 2023-04-07 DIAGNOSIS — M79672 Pain in left foot: Secondary | ICD-10-CM | POA: Diagnosis not present

## 2023-04-07 DIAGNOSIS — M79642 Pain in left hand: Secondary | ICD-10-CM | POA: Diagnosis not present

## 2023-04-12 DIAGNOSIS — H00025 Hordeolum internum left lower eyelid: Secondary | ICD-10-CM | POA: Diagnosis not present

## 2023-04-13 ENCOUNTER — Ambulatory Visit: Admit: 2023-04-13 | Discharge: 2023-04-14

## 2023-04-13 DIAGNOSIS — N9089 Other specified noninflammatory disorders of vulva and perineum: Principal | ICD-10-CM

## 2023-04-13 DIAGNOSIS — N958 Other specified menopausal and perimenopausal disorders: Principal | ICD-10-CM

## 2023-04-13 DIAGNOSIS — M6289 Other specified disorders of muscle: Principal | ICD-10-CM

## 2023-04-13 DIAGNOSIS — M7918 Myalgia, other site: Principal | ICD-10-CM

## 2023-04-13 DIAGNOSIS — N941 Unspecified dyspareunia: Principal | ICD-10-CM

## 2023-04-13 DIAGNOSIS — R102 Pelvic and perineal pain: Principal | ICD-10-CM

## 2023-04-13 MED ORDER — DIAZEPAM 5 MG TABLET
ORAL_TABLET | 0 refills | 0 days | Status: CP
Start: 2023-04-13 — End: ?

## 2023-04-13 MED ORDER — TRIAMCINOLONE ACETONIDE 0.1 % TOPICAL OINTMENT
Freq: Every day | TOPICAL | 0 refills | 0 days | Status: CP | PRN
Start: 2023-04-13 — End: 2024-04-12

## 2023-04-14 DIAGNOSIS — D473 Essential (hemorrhagic) thrombocythemia: Principal | ICD-10-CM

## 2023-04-14 DIAGNOSIS — D474 Osteomyelofibrosis: Principal | ICD-10-CM

## 2023-04-14 MED ORDER — GABAPENTIN 300 MG CAPSULE
ORAL_CAPSULE | Freq: Every evening | ORAL | 1 refills | 30 days | Status: CP
Start: 2023-04-14 — End: ?

## 2023-04-15 DIAGNOSIS — Z9484 Stem cells transplant status: Principal | ICD-10-CM

## 2023-04-15 DIAGNOSIS — D474 Osteomyelofibrosis: Principal | ICD-10-CM

## 2023-04-15 DIAGNOSIS — D473 Essential (hemorrhagic) thrombocythemia: Principal | ICD-10-CM

## 2023-04-15 MED ORDER — GABAPENTIN 300 MG CAPSULE
ORAL_CAPSULE | Freq: Two times a day (BID) | ORAL | 1 refills | 30 days | Status: CP
Start: 2023-04-15 — End: ?

## 2023-04-15 MED ORDER — ESTRADIOL 0.01% (0.1 MG/GRAM) VAGINAL CREAM
VAGINAL | 1 refills | 147 days | Status: CP
Start: 2023-04-15 — End: ?

## 2023-04-21 ENCOUNTER — Ambulatory Visit: Admit: 2023-04-21 | Discharge: 2023-04-22

## 2023-04-21 ENCOUNTER — Inpatient Hospital Stay: Admit: 2023-04-21 | Discharge: 2023-04-22

## 2023-04-21 ENCOUNTER — Other Ambulatory Visit: Admit: 2023-04-21 | Discharge: 2023-04-22

## 2023-04-21 DIAGNOSIS — Z9484 Stem cells transplant status: Principal | ICD-10-CM

## 2023-04-21 DIAGNOSIS — D473 Essential (hemorrhagic) thrombocythemia: Principal | ICD-10-CM

## 2023-04-21 DIAGNOSIS — D474 Osteomyelofibrosis: Principal | ICD-10-CM

## 2023-04-21 DIAGNOSIS — R7989 Other specified abnormal findings of blood chemistry: Principal | ICD-10-CM

## 2023-04-21 DIAGNOSIS — R21 Rash and other nonspecific skin eruption: Principal | ICD-10-CM

## 2023-04-21 DIAGNOSIS — D696 Thrombocytopenia, unspecified: Principal | ICD-10-CM

## 2023-04-21 DIAGNOSIS — D84822 Immunocompromised state associated with stem cell transplant: Principal | ICD-10-CM

## 2023-04-21 DIAGNOSIS — R102 Pelvic and perineal pain: Secondary | ICD-10-CM | POA: Diagnosis not present

## 2023-04-21 MED ORDER — DEXAMETHASONE 0.5 MG/5 ML ORAL SOLUTION
Freq: Three times a day (TID) | ORAL | 3 refills | 8.00 days | Status: CP
Start: 2023-04-21 — End: ?

## 2023-04-21 MED ORDER — DESONIDE 0.05 % TOPICAL CREAM
Freq: Two times a day (BID) | TOPICAL | 2 refills | 0.00 days | Status: CP
Start: 2023-04-21 — End: 2024-04-20

## 2023-04-27 DIAGNOSIS — M7918 Myalgia, other site: Principal | ICD-10-CM

## 2023-04-27 DIAGNOSIS — N941 Unspecified dyspareunia: Principal | ICD-10-CM

## 2023-04-27 DIAGNOSIS — M6289 Other specified disorders of muscle: Principal | ICD-10-CM

## 2023-04-28 ENCOUNTER — Ambulatory Visit: Admit: 2023-04-28 | Discharge: 2023-04-28 | Payer: PRIVATE HEALTH INSURANCE

## 2023-04-28 ENCOUNTER — Other Ambulatory Visit: Admit: 2023-04-28 | Discharge: 2023-04-28 | Payer: PRIVATE HEALTH INSURANCE

## 2023-04-28 DIAGNOSIS — D84822 Immunocompromised state associated with stem cell transplant: Principal | ICD-10-CM

## 2023-04-28 DIAGNOSIS — R7989 Other specified abnormal findings of blood chemistry: Principal | ICD-10-CM

## 2023-04-28 DIAGNOSIS — D696 Thrombocytopenia, unspecified: Principal | ICD-10-CM

## 2023-04-28 DIAGNOSIS — D473 Essential (hemorrhagic) thrombocythemia: Principal | ICD-10-CM

## 2023-04-28 DIAGNOSIS — Z9484 Stem cells transplant status: Principal | ICD-10-CM

## 2023-04-28 DIAGNOSIS — D89813 Graft-versus-host disease, unspecified: Principal | ICD-10-CM

## 2023-04-28 DIAGNOSIS — D474 Osteomyelofibrosis: Principal | ICD-10-CM

## 2023-04-28 DIAGNOSIS — M25551 Pain in right hip: Principal | ICD-10-CM

## 2023-04-28 MED ORDER — TRAMADOL 50 MG TABLET
ORAL_TABLET | Freq: Three times a day (TID) | ORAL | 0 refills | 10.00 days | Status: CP | PRN
Start: 2023-04-28 — End: ?

## 2023-04-28 MED ORDER — SERTRALINE 25 MG TABLET
ORAL_TABLET | Freq: Every day | ORAL | 2 refills | 60.00 days | Status: CP
Start: 2023-04-28 — End: ?

## 2023-04-29 DIAGNOSIS — D473 Essential (hemorrhagic) thrombocythemia: Principal | ICD-10-CM

## 2023-04-29 DIAGNOSIS — D474 Osteomyelofibrosis: Principal | ICD-10-CM

## 2023-04-29 MED ORDER — OLANZAPINE 5 MG TABLET
ORAL_TABLET | Freq: Every evening | ORAL | 2 refills | 30.00 days | Status: CP
Start: 2023-04-29 — End: 2024-04-28

## 2023-05-04 ENCOUNTER — Other Ambulatory Visit: Payer: Self-pay

## 2023-05-04 ENCOUNTER — Ambulatory Visit: Admitting: Physical Therapy

## 2023-05-04 DIAGNOSIS — M6281 Muscle weakness (generalized): Secondary | ICD-10-CM | POA: Diagnosis not present

## 2023-05-04 DIAGNOSIS — R293 Abnormal posture: Secondary | ICD-10-CM | POA: Insufficient documentation

## 2023-05-04 DIAGNOSIS — M62838 Other muscle spasm: Secondary | ICD-10-CM | POA: Insufficient documentation

## 2023-05-04 DIAGNOSIS — R279 Unspecified lack of coordination: Secondary | ICD-10-CM | POA: Diagnosis not present

## 2023-05-04 NOTE — Patient Instructions (Addendum)
 Moisturizers They are used in the vagina to hydrate the mucous membrane that make up the vaginal canal. Designed to keep a more normal acid balance (ph) Once placed in the vagina, it will last between two to three days.  Use 2-3 times per week at bedtime  Ingredients to avoid is glycerin and fragrance, can increase chance of infection Should not be used just before sex due to causing irritation Most are gels administered either in a tampon-shaped applicator or as a vaginal suppository. They are non-hormonal.   Types of Moisturizers(internal use)  Vitamin E vaginal suppositories- Whole foods, Amazon Moist Again Coconut oil- can break down condoms, any grocery store (prefer organic) Julva- (Do no use if taking  Tamoxifen) amazon Yes moisturizer- amazon NeuEve Silk , NeuEve Silver for menopausal or over 65 (if have severe vaginal atrophy or cancer treatments use NeuEve Silk for  1 month than move to [employer])- [employer], [place].com Olive and Bee intimate cream- www.oliveandbee.com.au Mae vaginal moisturizer- Amazon Aloe Good Clean Love Hyaluronic acid Hyalofemme Reveree hyaluronic acid inserts   Creams to use externally on the Vulva area Marathon Oil (good for for cancer patients that had radiation to the area)- [place] or [employer].https://garcia-valdez.org/ Vulva Balm/ V-magic cream by medicine mama- amazon Julva-amazon Vital "V Wild Yam salve ( help moisturize and help with thinning vulvar area, does have Beeswax MoodMaid Botanical Pro-Meno Wild Yam Cream- Amazon Desert Harvest Gele Cleo by Zane Herald labial moisturizer (Amazon),  Coconut or olive oil aloe Good Clean Love Enchanted Rose by intimate rose  Things to avoid in the vaginal area Do not use things to irritate the vulvar area No lotions just specialized creams for the vulva area- Neogyn, V-magic,  No soaps; can use Aveeno or Cal[place]ula cleanser, unscented Dove if needed. Must be gentle No deodorants No douches Good to  sleep without underwear to let the vaginal area to air out No scrubbing: spread the lips to let warm water rinse over labias and pat dry   kabucove.com

## 2023-05-04 NOTE — Therapy (Signed)
 OUTPATIENT PHYSICAL THERAPY FEMALE PELVIC EVALUATION   Patient Name: Latima Hamza MRN: [id number] DOB:September 18, 1970, 53 y.o., female Today's Date: 05/04/2023  END OF SESSION:  PT End of Session - 05/04/23 1448     Visit Number 1    Date for PT Re-Evaluation 11/03/23    Authorization Type [employer]    PT Start Time 1445    PT Stop Time 1526    PT Time Calculation (min) 41 min    Activity Tolerance Patient tolerated treatment well;No increased pain    Behavior During Therapy [facility] for tasks assessed/performed             Past Medical History:  Diagnosis Date   Bacterial infection    Basal cell carcinoma of skin 06/2011   Candida vaginitis    Fibrocystic breast    GERD (gastroesophageal reflux disease)    History of chicken pox    HPV (human papilloma virus) infection    Iron deficiency anemia    Myelofibrosis (HCC)    Ovarian cyst    Thrombocytosis    Vulvar inflammation    Yeast infection    Past Surgical History:  Procedure Laterality Date   BONE MARROW BIOPSY     SHOULDER SURGERY  11/2009   Patient Active Problem List   Diagnosis Date Noted   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   Chronic left shoulder pain 08/22/2013   Acromioclavicular joint separation 08/22/2013    PCP: Darnelle Elders, PA-C   REFERRING PROVIDER: Marci Setter, NP  REFERRING DIAG: N94.10 (ICD-10-CM) - Unspecified dyspareunia M79.18 (ICD-10-CM) - Myalgia, other site M62.89 (ICD-10-CM) - Other specified disorders of muscle  THERAPY DIAG:  Muscle weakness (generalized)  Other muscle spasm  Abnormal posture  Unspecified lack of coordination  Rationale for Evaluation and Treatment: Rehabilitation  ONSET DATE: several years  SUBJECTIVE:                                                                                                                                                                                           SUBJECTIVE STATEMENT: Has had pelvic PT  before and has a wand for help with pain. However doesn't use wand "like I'm supposed to". Has had medical changes in states for the last year, had a stem cell transplant last year. Required 30 days in the hospital, then a period of time at [facility] for hospital. Does still have pain with intercourse, feels pressure/fullness at low abdomen. Reports very difficult to relax with intercourse, positions are more painful than others. Does have urinary frequency, also has and sometimes uses vaginal valium and this helps with this. Does still get up  1-2x nightly.   Fluid intake: water - at least 40oz daily  PAIN:  Are you having pain? Yes NPRS scale: constant pressure at 3/10, intercourse 6.5-7/10 Pain location: Internal and Vaginal  Pain type: pressure at all times, shooting with intercourse Pain description: constant   Aggravating factors: penetration Relieving factors: removing penetration  PRECAUTIONS: Other: cancer treatments  RED FLAGS: None   WEIGHT BEARING RESTRICTIONS: No  FALLS:  Has patient fallen in last 6 months? 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 Candida vaginitis, Fibrocystic breast, HPV,  Myelofibrosis , Vulvar inflammation  Sexual abuse: No  BOWEL MOVEMENT: Pain with bowel movement: No Type of bowel movement:Type (Bristol Stool Scale) 4, Frequency daily, and Strain no Fully empty rectum: Yes:   Leakage: No Pads: No Fiber supplement/laxative magnesium  URINATION: Pain with urination: No Fully empty bladder: Yes: had a residual test and fully empties but reports she feels like there is still urine present Stream: Strong Urgency:  No Frequency: urges very often but distraction is helpful, sometimes urinates and within 15 mins has urge to go, most of the time around 2 hours. 1-2x Leakage: none Pads: No  INTERCOURSE:  Ability to have vaginal penetration Yes  Pain with intercourse: Initial Penetration, During Penetration, Deep Penetration, After Intercourse, and Pain Interrupts Intercourse DrynessYes - uses estrodial and Vaseline at vulva  Climax: external vibration only Marinoff Scale: 2/3 Uses a lubricant  PREGNANCY: Vaginal deliveries 2 Tearing Yes: with both Episiotomy No C-section deliveries 0 Currently pregnant No  PROLAPSE: Pressure   OBJECTIVE:  Note: Objective measures were completed at Evaluation unless otherwise noted.  DIAGNOSTIC FINDINGS:     COGNITION: Overall cognitive status: Within functional limits for tasks assessed     SENSATION: Light touch: Appears intact   FUNCTIONAL TESTS:  Functional squat - decreased descent mildly, mild bil knee valgus    POSTURE: rounded shoulders and forward head   LUMBARAROM/PROM:  A/PROM A/PROM  eval  Flexion WFL  Extension WFL  Right lateral flexion WFL  Left lateral flexion WFL  Right rotation Limited by25%  Left rotation Limited by 25%   (Blank rows = not tested)  LOWER EXTREMITY ROM:  Bil hips grossly limited by 25%  LOWER EXTREMITY MMT:  Bil hips grossly 4/5; knees 4+/5 PALPATION:   General: tightness noted in bil piriformis, bil lumbar paraspinals   Pelvic Alignment: WFL  Abdominal: mild tension in all quadrants but without pain                  External Perineal Exam: no TTP                             Internal Pelvic Floor: TTP in all directions at superficial layer, and bil deep layer  Patient confirms identification and approves PT to assess internal pelvic floor and treatment Yes No emotional/communication barriers or cognitive limitation. Patient is motivated to learn. Patient understands and agrees with  treatment goals and plan. PT explains patient will be examined in standing, sitting, and lying down to see how their muscles and joints work. When they are ready, they will be asked to  remove their underwear so PT can examine their perineum. The patient is also given the option of providing their own chaperone as one is not provided in our facility. The patient also has the right and is explained the right to defer or refuse any part of the evaluation or treatment including the internal exam. With the patient's consent, PT will use one gloved finger to gently assess the muscles of the pelvic floor, seeing how well it contracts and relaxes and if there is muscle symmetry. After, the patient will get dressed and PT and patient will discuss exam findings and plan of care. PT and patient discuss plan of care, schedule, attendance policy and HEP activities.  PELVIC MMT:   MMT eval  Vaginal 2/5; 5sl 4 reps  Internal Anal Sphincter   External Anal Sphincter   Puborectalis   Diastasis Recti   (Blank rows = not tested)        TONE: Slightly increased   PROLAPSE: Not seen in hooklying with cough  TODAY'S TREATMENT:                                                                                                                              DATE:   05/04/23 EVAL Examination completed, findings reviewed, pt educated on POC, HEP, and *. Pt motivated to participate in PT and agreeable to attempt recommendations.     PATIENT EDUCATION:  Education details: feminine moisturizers, pelvic relaxation video  Person educated: Patient Education method: Explanation, Demonstration, Tactile cues, Verbal cues, and Handouts Education comprehension: verbalized understanding, returned demonstration, verbal cues required, tactile cues required, and needs further education  HOME EXERCISE PROGRAM: TBD  ASSESSMENT:  CLINICAL IMPRESSION: Patient is a 53 y.o. female  who was seen today for physical therapy evaluation  and treatment for pelvic pain, increased urinary frequency, sensation of incomplete urinary emptying, and weakness in core and hips. 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 Candida vaginitis, Fibrocystic breast, HPV,  Myelofibrosis , Vulvar inflammation. Pt demonstrated decreased flexibility in spine and hips, decreased core and hip strength, restrictions in mobility at abdomen. Patient consented to internal pelvic floor assessment vaginally this date and found to have decreased strength, endurance, and coordination. Patient benefited from verbal cues for improved technique with pelvic floor contractions and coordination with breathing. Pt did have TTP throughout pelvic assessment and tension/tightness noted. Pt would benefit from additional PT to further address deficits.    OBJECTIVE IMPAIRMENTS: decreased activity tolerance, decreased coordination, decreased endurance, decreased mobility, decreased strength, increased fascial restrictions, increased muscle spasms, impaired flexibility, impaired sensation, improper body mechanics, postural dysfunction, and pain.   ACTIVITY LIMITATIONS: continence, locomotion level, and incontinence   PARTICIPATION LIMITATIONS: interpersonal relationship and community activity  PERSONAL FACTORS: Past/current experiences, Time since onset of injury/illness/exacerbation, and 1 comorbidity: medical history  are also affecting patient's functional outcome.  REHAB POTENTIAL: Good  CLINICAL DECISION MAKING: Evolving/moderate complexity  EVALUATION COMPLEXITY: Moderate   GOALS: Goals reviewed with patient? Yes  SHORT TERM GOALS: Target date: 06/01/23  Pt to be I with HEP for carry over and continuing recommendations for improved outcomes.   Baseline: Goal status: INITIAL  2.  Pt to report improved time between bladder voids to  at least 2 hours for improved QOL with decreased urinary frequency.   Baseline:  Goal status: INITIAL  3.  Pt to be I with relaxation techniques and pelvic drops to decreased tension at pelvic floor and improved tolerance to vaginal penetration.  Baseline:  Goal status: INITIAL  4.  Pt to demonstrate ability to complete pelvic floor contraction/relaxation/bulge for full mobility of pelvic floor and decreased tension.  Baseline:  Goal status: INITIAL   LONG TERM GOALS: Target date: 11/03/23  Pt to be I with  advanced HEP for carry over and continuing recommendations for improved outcomes.   Baseline:  Goal status: INITIAL  2.  Pt to report improved time between bladder voids to at least 2.5 hours for improved QOL with decreased urinary frequency.   Baseline:  Goal status: INITIAL  3.  Pt to demonstrate improved coordination of pelvic floor and breathing mechanics with 10# squat with appropriate synergistic patterns to decrease pain  at least 75% of the time for improved ability to complete a 30 minute workout with strain at pelvic floor and symptoms.    Baseline:  Goal status: INITIAL  4.  Pt to report no more than 2/10 pain with use of size 6 vaginal dilator or equivalent for improved tolerance to vaginal penetration for intercourse and medical exams.  Baseline:  Goal status: INITIAL  5.  Pt will be independent with the knack, urge suppression technique, and double voiding in order to improve bladder habits and decrease urinary incontinence.   Baseline:  Goal status: INITIAL   PLAN:  PT FREQUENCY: 2x/week   PT DURATION:  10 sessions  PLANNED INTERVENTIONS: 97110-Therapeutic exercises, 97530- Therapeutic activity, [id number]- Neuromuscular re-education, 97535- Self Care, 55732- Manual therapy, [phone]- Canalith repositioning, [id number]- Aquatic Therapy, [phone]- Electrical stimulation (manual), Patient/Family education, Taping, Dry Needling, Joint mobilization, Spinal mobilization, Scar  mobilization, DME instructions, Cryotherapy, Moist heat, and Biofeedback  PLAN FOR NEXT SESSION: relaxation techniques, breathing mechanics, manual at back/hips/abdomen, internal pelvic floor as needed  Avie Lemme, PT, DPT 04/22/252:48 PM

## 2023-05-06 ENCOUNTER — Ambulatory Visit: Admit: 2023-05-06 | Discharge: 2023-05-06 | Payer: PRIVATE HEALTH INSURANCE

## 2023-05-06 ENCOUNTER — Ambulatory Visit
Admit: 2023-05-06 | Discharge: 2023-05-06 | Payer: PRIVATE HEALTH INSURANCE | Attending: Nurse Practitioner | Primary: Nurse Practitioner

## 2023-05-06 DIAGNOSIS — D474 Osteomyelofibrosis: Principal | ICD-10-CM

## 2023-05-06 DIAGNOSIS — D89813 Graft-versus-host disease, unspecified: Principal | ICD-10-CM

## 2023-05-06 DIAGNOSIS — G6289 Other specified polyneuropathies: Principal | ICD-10-CM

## 2023-05-06 DIAGNOSIS — D473 Essential (hemorrhagic) thrombocythemia: Principal | ICD-10-CM

## 2023-05-06 DIAGNOSIS — Z9484 Stem cells transplant status: Principal | ICD-10-CM

## 2023-05-06 DIAGNOSIS — D84822 Immunocompromised state associated with stem cell transplant: Principal | ICD-10-CM

## 2023-05-06 MED ORDER — PREGABALIN 50 MG CAPSULE
ORAL_CAPSULE | ORAL | 0 refills | 74.00 days | Status: CP
Start: 2023-05-06 — End: 2023-07-19

## 2023-05-08 DIAGNOSIS — D474 Osteomyelofibrosis: Principal | ICD-10-CM

## 2023-05-08 DIAGNOSIS — D473 Essential (hemorrhagic) thrombocythemia: Principal | ICD-10-CM

## 2023-05-09 DIAGNOSIS — D473 Essential (hemorrhagic) thrombocythemia: Principal | ICD-10-CM

## 2023-05-09 DIAGNOSIS — Z9484 Stem cells transplant status: Principal | ICD-10-CM

## 2023-05-09 DIAGNOSIS — D474 Osteomyelofibrosis: Principal | ICD-10-CM

## 2023-05-09 MED ORDER — CALCIUM 600 MG (AS CARBONATE)-VITAMIN D3 20 MCG (800 UNIT) TABLET
ORAL_TABLET | Freq: Two times a day (BID) | ORAL | 3 refills | 30.00000 days
Start: 2023-05-09 — End: 2023-06-08

## 2023-05-10 DIAGNOSIS — D474 Osteomyelofibrosis: Principal | ICD-10-CM

## 2023-05-10 DIAGNOSIS — D473 Essential (hemorrhagic) thrombocythemia: Principal | ICD-10-CM

## 2023-05-10 DIAGNOSIS — M25561 Pain in right knee: Secondary | ICD-10-CM | POA: Diagnosis not present

## 2023-05-10 DIAGNOSIS — M25562 Pain in left knee: Secondary | ICD-10-CM | POA: Diagnosis not present

## 2023-05-10 DIAGNOSIS — M79672 Pain in left foot: Secondary | ICD-10-CM | POA: Diagnosis not present

## 2023-05-10 DIAGNOSIS — M79671 Pain in right foot: Secondary | ICD-10-CM | POA: Diagnosis not present

## 2023-05-10 DIAGNOSIS — M25552 Pain in left hip: Secondary | ICD-10-CM | POA: Diagnosis not present

## 2023-05-10 DIAGNOSIS — M25551 Pain in right hip: Secondary | ICD-10-CM | POA: Diagnosis not present

## 2023-05-10 MED ORDER — CALCIUM 600 MG (AS CARBONATE)-VITAMIN D3 20 MCG (800 UNIT) TABLET
ORAL_TABLET | Freq: Two times a day (BID) | ORAL | 3 refills | 30.00000 days | Status: CP
Start: 2023-05-10 — End: 2023-06-09
  Filled 2023-05-13: qty 60, 30d supply, fill #0

## 2023-05-11 ENCOUNTER — Ambulatory Visit: Admitting: Physical Therapy

## 2023-05-11 DIAGNOSIS — R279 Unspecified lack of coordination: Secondary | ICD-10-CM

## 2023-05-11 DIAGNOSIS — R293 Abnormal posture: Secondary | ICD-10-CM | POA: Diagnosis not present

## 2023-05-11 DIAGNOSIS — M6281 Muscle weakness (generalized): Secondary | ICD-10-CM

## 2023-05-11 DIAGNOSIS — M62838 Other muscle spasm: Secondary | ICD-10-CM | POA: Diagnosis not present

## 2023-05-11 NOTE — Therapy (Signed)
 OUTPATIENT PHYSICAL THERAPY FEMALE PELVIC TREATMENT   Patient Name: Lynn Mccarthy MRN: [id number] DOB:04/09/1970, 53 y.o., female Today's Date: 05/11/2023  END OF SESSION:  PT End of Session - 05/11/23 0934     Visit Number 2    Date for PT Re-Evaluation 11/03/23    Authorization Type [employer]et[employer]    PT Start Time 0932    PT Stop Time 1015    PT Time Calculation (min) 43 min    Activity Tolerance Patient tolerated treatment well;No increased pain    Behavior During Therapy [facility] for tasks assessed/performed             Past Medical History:  Diagnosis Date   Bacterial infection    Basal cell carcinoma of skin 06/2011   Candida vaginitis    Fibrocystic breast    GERD (gastroesophageal reflux disease)    History of chicken pox    HPV (human papilloma virus) infection    Iron deficiency anemia    Myelofibrosis (HCC)    Ovarian cyst    Thrombocytosis    Vulvar inflammation    Yeast infection    Past Surgical History:  Procedure Laterality Date   BONE MARROW BIOPSY     SHOULDER SURGERY  11/2009   Patient Active Problem List   Diagnosis Date Noted   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   Chronic left shoulder pain 08/22/2013   Acromioclavicular joint separation 08/22/2013    PCP: Darnelle Elders, PA-C   REFERRING PROVIDER: Marci Setter, NP  REFERRING DIAG: N94.10 (ICD-10-CM) - Unspecified dyspareunia M79.18 (ICD-10-CM) - Myalgia, other site M62.89 (ICD-10-CM) - Other specified disorders of muscle  THERAPY DIAG:  Muscle weakness (generalized)  Other muscle spasm  Unspecified lack of coordination  Abnormal posture  Rationale for Evaluation and Treatment: Rehabilitation  ONSET DATE: several years  SUBJECTIVE:                                                                                                                                                                                           SUBJECTIVE STATEMENT: Had started lyrica  and had a great day on Saturday but pain returned to normal levels on Sunday. Has tried pelvic meditation without pain relief; does feel like she is emptying bladder much more now without straining. Did use wand and feels "like I have cuts" around top of pelvic floor/ vagina.   Fluid intake: water - at least 40oz daily  PAIN:  Are you having pain? Yes NPRS scale: constant pressure at 3/10, intercourse 6.5-7/10 Pain location: Internal and Vaginal  Pain type: pressure at all times, shooting with intercourse Pain description: constant  Aggravating factors: penetration Relieving factors: removing penetration  PRECAUTIONS: Other: cancer treatments  RED FLAGS: None   WEIGHT BEARING RESTRICTIONS: No  FALLS:  Has patient fallen in last 6 months? 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 Candida vaginitis, Fibrocystic breast, HPV,  Myelofibrosis , Vulvar inflammation  Sexual abuse: No  BOWEL MOVEMENT: Pain with bowel movement: No Type of bowel movement:Type (Bristol Stool Scale) 4, Frequency daily, and Strain no Fully empty rectum: Yes:   Leakage: No Pads: No Fiber supplement/laxative magnesium  URINATION: Pain with urination: No Fully empty bladder: Yes: had a residual test and fully empties but reports she feels like there is still urine present Stream: Strong Urgency: No Frequency: urges very often but distraction is helpful, sometimes urinates and within 15 mins has urge to go, most of the time around 2 hours. 1-2x Leakage: none Pads: No  INTERCOURSE:  Ability to have vaginal penetration Yes  Pain with intercourse: Initial Penetration, During  Penetration, Deep Penetration, After Intercourse, and Pain Interrupts Intercourse DrynessYes - uses estrodial and Vaseline at vulva  Climax: external vibration only Marinoff Scale: 2/3 Uses a lubricant  PREGNANCY: Vaginal deliveries 2 Tearing Yes: with both Episiotomy No C-section deliveries 0 Currently pregnant No  PROLAPSE: Pressure   OBJECTIVE:  Note: Objective measures were completed at Evaluation unless otherwise noted.  DIAGNOSTIC FINDINGS:     COGNITION: Overall cognitive status: Within functional limits for tasks assessed     SENSATION: Light touch: Appears intact   FUNCTIONAL TESTS:  Functional squat - decreased descent mildly, mild bil knee valgus    POSTURE: rounded shoulders and forward head   LUMBARAROM/PROM:  A/PROM A/PROM  eval  Flexion WFL  Extension WFL  Right lateral flexion WFL  Left lateral flexion WFL  Right rotation Limited by25%  Left rotation Limited by 25%   (Blank rows = not tested)  LOWER EXTREMITY ROM:  Bil hips grossly limited by 25%  LOWER EXTREMITY MMT:  Bil hips grossly 4/5; knees 4+/5 PALPATION:   General: tightness noted in bil piriformis, bil lumbar paraspinals   Pelvic Alignment: WFL  Abdominal: mild tension in all quadrants but without pain                  External Perineal Exam: no TTP                             Internal Pelvic Floor: TTP in all directions at superficial layer, and bil deep layer  Patient confirms identification and approves PT to assess internal pelvic floor and treatment Yes No emotional/communication barriers or cognitive limitation. Patient is motivated to learn. Patient understands and agrees with treatment goals and plan. PT explains patient will be examined in standing, sitting, and lying down to see how their muscles and joints work. When they are ready, they will be asked to remove their underwear so PT can examine their perineum. The patient is also given the option of providing  their own chaperone as one is not provided in our facility. The patient also has the right and is explained the right to defer or refuse any part of the  evaluation or treatment including the internal exam. With the patient's consent, PT will use one gloved finger to gently assess the muscles of the pelvic floor, seeing how well it contracts and relaxes and if there is muscle symmetry. After, the patient will get dressed and PT and patient will discuss exam findings and plan of care. PT and patient discuss plan of care, schedule, attendance policy and HEP activities.  PELVIC MMT:   MMT eval  Vaginal 2/5; 5s, 4 reps  Internal Anal Sphincter   External Anal Sphincter   Puborectalis   Diastasis Recti   (Blank rows = not tested)        TONE: Slightly increased   PROLAPSE: Not seen in hooklying with cough  TODAY'S TREATMENT:                                                                                                                              DATE:   05/04/23 EVAL Examination completed, findings reviewed, pt educated on POC, HEP. Pt motivated to participate in PT and agreeable to attempt recommendations.    05/11/23: Vibration plate 2x1 min low setting standing, 1 min sitting low setting cues for diaphragmatic breathing and pelvic drops for improved relaxation and decreased pain with internal vaginal manual work Manual at abdomen in lower quadrants for fascial release with good effect, release felt by pt and PT. Patient consented to internal pelvic floor treatment vaginally this date and continues to have tension throughout but seemly better than eval with less pain superficially. Tolerated gentle STM at Lt deep pelvic floor, progressed to anterior pelvic floor on either side of urethra with Rt side more tender and restricted than left. Reports this did replicate abdominal pain felt, released well at Lt side but tension remained at Rt.   PATIENT EDUCATION:  Education details: feminine  moisturizers, pelvic relaxation video  Person educated: Patient Education method: Explanation, Demonstration, Tactile cues, Verbal cues, and Handouts Education comprehension: verbalized understanding, returned demonstration, verbal cues required, tactile cues required, and needs further education  HOME EXERCISE PROGRAM: TBD  ASSESSMENT:  CLINICAL IMPRESSION: Patient is a 53 y.o. female  who was seen today for physical therapy evaluation and treatment for pelvic pain, increased urinary frequency, sensation of incomplete urinary emptying, and weakness in core and hips. Pt session focused on manual with relaxation techniques throughout with NMRE utilized for relearning the down training of pelvic floor and decreased abdominal gripping. Pt tolerated well but did need verbal cues consistently for decreased tension and visualizations for decreased pelvic floor tightening.  Pt would benefit from additional PT to further address deficits.    OBJECTIVE IMPAIRMENTS: decreased activity tolerance, decreased coordination, decreased endurance, decreased mobility, decreased strength, increased fascial restrictions, increased muscle spasms, impaired flexibility, impaired sensation, improper body mechanics, postural dysfunction, and pain.   ACTIVITY LIMITATIONS: continence, locomotion level, and incontinence   PARTICIPATION LIMITATIONS: interpersonal relationship and community activity  PERSONAL FACTORS: Past/current experiences, Time since onset of  injury/illness/exacerbation, and 1 comorbidity: medical history  are also affecting patient's functional outcome.   REHAB POTENTIAL: Good  CLINICAL DECISION MAKING: Evolving/moderate complexity  EVALUATION COMPLEXITY: Moderate   GOALS: Goals reviewed with patient? Yes  SHORT TERM GOALS: Target date: 06/01/23  Pt to be I with HEP for carry over and continuing recommendations for improved outcomes.   Baseline: Goal status: INITIAL  2.  Pt to report  improved time between bladder voids to at least 2 hours for improved QOL with decreased urinary frequency.   Baseline:  Goal status: INITIAL  3.  Pt to be I with relaxation techniques and pelvic drops to decreased tension at pelvic floor and improved tolerance to vaginal penetration.  Baseline:  Goal status: INITIAL  4.  Pt to demonstrate ability to complete pelvic floor contraction/relaxation/bulge for full mobility of pelvic floor and decreased tension.  Baseline:  Goal status: INITIAL   LONG TERM GOALS: Target date: 11/03/23  Pt to be I with  advanced HEP for carry over and continuing recommendations for improved outcomes.   Baseline:  Goal status: INITIAL  2.  Pt to report improved time between bladder voids to at least 2.5 hours for improved QOL with decreased urinary frequency.   Baseline:  Goal status: INITIAL  3.  Pt to demonstrate improved coordination of pelvic floor and breathing mechanics with 10# squat with appropriate synergistic patterns to decrease pain  at least 75% of the time for improved ability to complete a 30 minute workout with strain at pelvic floor and symptoms.    Baseline:  Goal status: INITIAL  4.  Pt to report no more than 2/10 pain with use of size 6 vaginal dilator or equivalent for improved tolerance to vaginal penetration for intercourse and medical exams.  Baseline:  Goal status: INITIAL  5.  Pt will be independent with the knack, urge suppression technique, and double voiding in order to improve bladder habits and decrease urinary incontinence.   Baseline:  Goal status: INITIAL   PLAN:  PT FREQUENCY: 2x/week   PT DURATION:  10 sessions  PLANNED INTERVENTIONS: 97110-Therapeutic exercises, 97530- Therapeutic activity, 97112- Neuromuscular re-education, 97535- Self Care, 47829- Manual therapy, [phone]- Canalith repositioning, [id number]- Aquatic Therapy, [phone]- Electrical stimulation (manual), Patient/Family education, Taping, Dry Needling, Joint  mobilization, Spinal mobilization, Scar mobilization, DME instructions, Cryotherapy, Moist heat, and Biofeedback  PLAN FOR NEXT SESSION: relaxation techniques, breathing mechanics, manual at back/hips/abdomen, internal pelvic floor as needed  Avie Lemme, PT, DPT 05/10/2508:44 AM

## 2023-05-12 DIAGNOSIS — D473 Essential (hemorrhagic) thrombocythemia: Principal | ICD-10-CM

## 2023-05-12 DIAGNOSIS — D474 Osteomyelofibrosis: Principal | ICD-10-CM

## 2023-05-12 DIAGNOSIS — Z9484 Stem cells transplant status: Principal | ICD-10-CM

## 2023-05-12 DIAGNOSIS — M25561 Pain in right knee: Secondary | ICD-10-CM | POA: Diagnosis not present

## 2023-05-12 DIAGNOSIS — M25551 Pain in right hip: Secondary | ICD-10-CM | POA: Diagnosis not present

## 2023-05-12 DIAGNOSIS — M25552 Pain in left hip: Secondary | ICD-10-CM | POA: Diagnosis not present

## 2023-05-12 DIAGNOSIS — M25562 Pain in left knee: Secondary | ICD-10-CM | POA: Diagnosis not present

## 2023-05-12 DIAGNOSIS — M79671 Pain in right foot: Secondary | ICD-10-CM | POA: Diagnosis not present

## 2023-05-12 DIAGNOSIS — M79672 Pain in left foot: Secondary | ICD-10-CM | POA: Diagnosis not present

## 2023-05-12 MED ORDER — MAGNESIUM OXIDE-MAGNESIUM AMINO ACID CHELATE 133 MG TABLET
ORAL_TABLET | Freq: Two times a day (BID) | ORAL | 1 refills | 33.00000 days | Status: CP
Start: 2023-05-12 — End: ?
  Filled 2023-05-13: qty 200, 33d supply, fill #0

## 2023-05-13 ENCOUNTER — Encounter: Payer: Self-pay | Admitting: Physical Therapy

## 2023-05-13 ENCOUNTER — Ambulatory Visit
Admit: 2023-05-13 | Discharge: 2023-05-14 | Payer: PRIVATE HEALTH INSURANCE | Attending: Critical Care Medicine | Primary: Critical Care Medicine

## 2023-05-13 ENCOUNTER — Inpatient Hospital Stay: Admit: 2023-05-13 | Discharge: 2023-05-14 | Payer: PRIVATE HEALTH INSURANCE

## 2023-05-13 ENCOUNTER — Ambulatory Visit: Admit: 2023-05-13 | Discharge: 2023-05-14 | Payer: PRIVATE HEALTH INSURANCE

## 2023-05-13 ENCOUNTER — Other Ambulatory Visit: Admit: 2023-05-13 | Discharge: 2023-05-14 | Payer: PRIVATE HEALTH INSURANCE

## 2023-05-13 DIAGNOSIS — Z9484 Stem cells transplant status: Principal | ICD-10-CM

## 2023-05-13 DIAGNOSIS — D473 Essential (hemorrhagic) thrombocythemia: Principal | ICD-10-CM

## 2023-05-13 DIAGNOSIS — D474 Osteomyelofibrosis: Principal | ICD-10-CM

## 2023-05-13 DIAGNOSIS — D89813 Graft-versus-host disease, unspecified: Principal | ICD-10-CM

## 2023-05-13 DIAGNOSIS — R202 Paresthesia of skin: Secondary | ICD-10-CM | POA: Diagnosis not present

## 2023-05-13 MED ORDER — GABAPENTIN 300 MG CAPSULE
ORAL_CAPSULE | Freq: Two times a day (BID) | ORAL | 11 refills | 30.00000 days | Status: CP
Start: 2023-05-13 — End: 2024-05-12

## 2023-05-13 MED ORDER — TRAMADOL 50 MG TABLET
ORAL_TABLET | Freq: Three times a day (TID) | ORAL | 0 refills | 10.00000 days | Status: CP | PRN
Start: 2023-05-13 — End: ?

## 2023-05-14 DIAGNOSIS — D473 Essential (hemorrhagic) thrombocythemia: Principal | ICD-10-CM

## 2023-05-14 DIAGNOSIS — D474 Osteomyelofibrosis: Principal | ICD-10-CM

## 2023-05-14 DIAGNOSIS — M7601 Gluteal tendinitis, right hip: Secondary | ICD-10-CM | POA: Diagnosis not present

## 2023-05-14 DIAGNOSIS — R609 Edema, unspecified: Secondary | ICD-10-CM | POA: Diagnosis not present

## 2023-05-14 DIAGNOSIS — S73191A Other sprain of right hip, initial encounter: Secondary | ICD-10-CM | POA: Diagnosis not present

## 2023-05-14 DIAGNOSIS — M948X5 Other specified disorders of cartilage, thigh: Secondary | ICD-10-CM | POA: Diagnosis not present

## 2023-05-16 DIAGNOSIS — D473 Essential (hemorrhagic) thrombocythemia: Principal | ICD-10-CM

## 2023-05-16 DIAGNOSIS — D474 Osteomyelofibrosis: Principal | ICD-10-CM

## 2023-05-17 DIAGNOSIS — D473 Essential (hemorrhagic) thrombocythemia: Principal | ICD-10-CM

## 2023-05-17 DIAGNOSIS — D474 Osteomyelofibrosis: Principal | ICD-10-CM

## 2023-05-17 DIAGNOSIS — M79671 Pain in right foot: Secondary | ICD-10-CM | POA: Diagnosis not present

## 2023-05-17 DIAGNOSIS — M79672 Pain in left foot: Secondary | ICD-10-CM | POA: Diagnosis not present

## 2023-05-17 DIAGNOSIS — M25552 Pain in left hip: Secondary | ICD-10-CM | POA: Diagnosis not present

## 2023-05-17 DIAGNOSIS — M25562 Pain in left knee: Secondary | ICD-10-CM | POA: Diagnosis not present

## 2023-05-17 DIAGNOSIS — M25551 Pain in right hip: Secondary | ICD-10-CM | POA: Diagnosis not present

## 2023-05-17 DIAGNOSIS — M25561 Pain in right knee: Secondary | ICD-10-CM | POA: Diagnosis not present

## 2023-05-18 ENCOUNTER — Ambulatory Visit: Payer: Self-pay | Admitting: Physical Therapy

## 2023-05-18 ENCOUNTER — Other Ambulatory Visit: Admit: 2023-05-18 | Discharge: 2023-05-19 | Payer: PRIVATE HEALTH INSURANCE

## 2023-05-18 ENCOUNTER — Ambulatory Visit: Admit: 2023-05-18 | Discharge: 2023-05-19 | Payer: PRIVATE HEALTH INSURANCE

## 2023-05-18 DIAGNOSIS — D474 Osteomyelofibrosis: Principal | ICD-10-CM

## 2023-05-18 DIAGNOSIS — D473 Essential (hemorrhagic) thrombocythemia: Principal | ICD-10-CM

## 2023-05-18 DIAGNOSIS — D89813 Graft-versus-host disease, unspecified: Principal | ICD-10-CM

## 2023-05-18 DIAGNOSIS — Z9484 Stem cells transplant status: Principal | ICD-10-CM

## 2023-05-18 DIAGNOSIS — Z9481 Bone marrow transplant status: Secondary | ICD-10-CM | POA: Diagnosis not present

## 2023-05-18 DIAGNOSIS — D84822 Immunodeficiency due to external causes: Secondary | ICD-10-CM | POA: Diagnosis not present

## 2023-05-20 DIAGNOSIS — G893 Neoplasm related pain (acute) (chronic): Principal | ICD-10-CM

## 2023-05-20 DIAGNOSIS — M25551 Pain in right hip: Secondary | ICD-10-CM | POA: Diagnosis not present

## 2023-05-20 DIAGNOSIS — M79672 Pain in left foot: Secondary | ICD-10-CM | POA: Diagnosis not present

## 2023-05-20 DIAGNOSIS — M25552 Pain in left hip: Secondary | ICD-10-CM | POA: Diagnosis not present

## 2023-05-20 DIAGNOSIS — M25562 Pain in left knee: Secondary | ICD-10-CM | POA: Diagnosis not present

## 2023-05-20 DIAGNOSIS — M79671 Pain in right foot: Secondary | ICD-10-CM | POA: Diagnosis not present

## 2023-05-20 DIAGNOSIS — M25561 Pain in right knee: Secondary | ICD-10-CM | POA: Diagnosis not present

## 2023-05-24 DIAGNOSIS — D473 Essential (hemorrhagic) thrombocythemia: Principal | ICD-10-CM

## 2023-05-24 DIAGNOSIS — D474 Osteomyelofibrosis: Principal | ICD-10-CM

## 2023-05-25 ENCOUNTER — Ambulatory Visit: Payer: Self-pay | Admitting: Physical Therapy

## 2023-05-25 ENCOUNTER — Encounter: Payer: Self-pay | Admitting: Physical Therapy

## 2023-05-25 ENCOUNTER — Ambulatory Visit: Admit: 2023-05-25 | Discharge: 2023-05-26 | Payer: PRIVATE HEALTH INSURANCE

## 2023-05-25 ENCOUNTER — Ambulatory Visit
Admit: 2023-05-25 | Discharge: 2023-05-26 | Payer: PRIVATE HEALTH INSURANCE | Attending: Primary Care | Primary: Primary Care

## 2023-05-25 DIAGNOSIS — Z9484 Stem cells transplant status: Principal | ICD-10-CM

## 2023-05-25 DIAGNOSIS — D473 Essential (hemorrhagic) thrombocythemia: Principal | ICD-10-CM

## 2023-05-25 DIAGNOSIS — D474 Osteomyelofibrosis: Principal | ICD-10-CM

## 2023-05-25 DIAGNOSIS — D89813 Graft-versus-host disease, unspecified: Principal | ICD-10-CM

## 2023-05-25 DIAGNOSIS — D84822 Immunodeficiency due to external causes: Secondary | ICD-10-CM | POA: Diagnosis not present

## 2023-05-25 DIAGNOSIS — M2550 Pain in unspecified joint: Secondary | ICD-10-CM | POA: Diagnosis not present

## 2023-05-25 DIAGNOSIS — M62838 Other muscle spasm: Secondary | ICD-10-CM | POA: Insufficient documentation

## 2023-05-25 DIAGNOSIS — R279 Unspecified lack of coordination: Secondary | ICD-10-CM | POA: Insufficient documentation

## 2023-05-25 DIAGNOSIS — M6281 Muscle weakness (generalized): Secondary | ICD-10-CM | POA: Diagnosis not present

## 2023-05-25 DIAGNOSIS — G8929 Other chronic pain: Secondary | ICD-10-CM | POA: Diagnosis not present

## 2023-05-25 DIAGNOSIS — R293 Abnormal posture: Secondary | ICD-10-CM | POA: Diagnosis not present

## 2023-05-25 DIAGNOSIS — Z5181 Encounter for therapeutic drug level monitoring: Secondary | ICD-10-CM | POA: Diagnosis not present

## 2023-05-25 DIAGNOSIS — M791 Myalgia, unspecified site: Secondary | ICD-10-CM | POA: Diagnosis not present

## 2023-05-25 NOTE — Therapy (Signed)
 OUTPATIENT PHYSICAL THERAPY FEMALE PELVIC TREATMENT   Patient Name: Navada Littler MRN: [id number] DOB:March 23, 1970, 53 y.o., female Today's Date: 05/25/2023  END OF SESSION:  PT End of Session - 05/25/23 0935     Visit Number 3    Date for PT Re-Evaluation 11/03/23    Authorization Type [employer]    PT Start Time 0932    PT Stop Time 1014    PT Time Calculation (min) 42 min    Activity Tolerance Patient tolerated treatment well;No increased pain    Behavior During Therapy [facility] for tasks assessed/performed             Past Medical History:  Diagnosis Date   Bacterial infection    Basal cell carcinoma of skin 06/2011   Candida vaginitis    Fibrocystic breast    GERD (gastroesophageal reflux disease)    History of chicken pox    HPV (human papilloma virus) infection    Iron deficiency anemia    Myelofibrosis (HCC)    Ovarian cyst    Thrombocytosis    Vulvar inflammation    Yeast infection    Past Surgical History:  Procedure Laterality Date   BONE MARROW BIOPSY     SHOULDER SURGERY  11/2009   Patient Active Problem List   Diagnosis Date Noted   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   Chronic left shoulder pain 08/22/2013   Acromioclavicular joint separation 08/22/2013    PCP: Darnelle Elders, PA-C   REFERRING PROVIDER: Marci Setter, NP  REFERRING DIAG: N94.10 (ICD-10-CM) - Unspecified dyspareunia M79.18 (ICD-10-CM) - Myalgia, other site M62.89 (ICD-10-CM) - Other specified disorders of muscle  THERAPY DIAG:  Muscle weakness (generalized)  Unspecified lack of coordination  Other muscle spasm  Abnormal posture  Rationale for Evaluation and Treatment: Rehabilitation  ONSET DATE: several years  SUBJECTIVE:                                                                                                                                                                                           SUBJECTIVE STATEMENT: Has stopped  lyrica. Does still have high pain with penetration but after this pain decreased. Started 5/10 and lowered to 3/10 positionally.   Fluid intake: water - at least 40oz daily  PAIN:  Are you having pain? Yes NPRS scale: constant pressure at 3/10, intercourse 6.5-7/10 Pain location: Internal and Vaginal  Pain type: pressure at all times, shooting with intercourse Pain description: constant   Aggravating factors: penetration Relieving factors: removing penetration  PRECAUTIONS: Other: cancer treatments  RED FLAGS: None   WEIGHT BEARING RESTRICTIONS: No  FALLS:  Has patient fallen in  last 6 months? 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 Candida vaginitis, Fibrocystic breast, HPV,  Myelofibrosis , Vulvar inflammation  Sexual abuse: No  BOWEL MOVEMENT: Pain with bowel movement: No Type of bowel movement:Type (Bristol Stool Scale) 4, Frequency daily, and Strain no Fully empty rectum: Yes:   Leakage: No Pads: No Fiber supplement/laxative magnesium  URINATION: Pain with urination: No Fully empty bladder: Yes: had a residual test and fully empties but reports she feels like there is still urine present Stream: Strong Urgency: No Frequency: urges very often but distraction is helpful, sometimes urinates and within 15 mins has urge to go, most of the time around 2 hours. 1-2x Leakage: none Pads: No  INTERCOURSE:  Ability to have vaginal penetration Yes  Pain with intercourse: Initial Penetration, During Penetration, Deep Penetration, After Intercourse, and Pain Interrupts Intercourse DrynessYes - uses estrodial and Vaseline at vulva  Climax:  external vibration only Marinoff Scale: 2/3 Uses a lubricant  PREGNANCY: Vaginal deliveries 2 Tearing Yes: with both Episiotomy No C-section deliveries 0 Currently pregnant No  PROLAPSE: Pressure   OBJECTIVE:  Note: Objective measures were completed at Evaluation unless otherwise noted.  DIAGNOSTIC FINDINGS:     COGNITION: Overall cognitive status: Within functional limits for tasks assessed     SENSATION: Light touch: Appears intact   FUNCTIONAL TESTS:  Functional squat - decreased descent mildly, mild bil knee valgus    POSTURE: rounded shoulders and forward head   LUMBARAROM/PROM:  A/PROM A/PROM  eval  Flexion WFL  Extension WFL  Right lateral flexion WFL  Left lateral flexion WFL  Right rotation Limited by25%  Left rotation Limited by 25%   (Blank rows = not tested)  LOWER EXTREMITY ROM:  Bil hips grossly limited by 25%  LOWER EXTREMITY MMT:  Bil hips grossly 4/5; knees 4+/5 PALPATION:   General: tightness noted in bil piriformis, bil lumbar paraspinals   Pelvic Alignment: WFL  Abdominal: mild tension in all quadrants but without pain                  External Perineal Exam: no TTP                             Internal Pelvic Floor: TTP in all directions at superficial layer, and bil deep layer  Patient confirms identification and approves PT to assess internal pelvic floor and treatment Yes No emotional/communication barriers or cognitive limitation. Patient is motivated to learn. Patient understands and agrees with treatment goals and plan. PT explains patient will be examined in standing, sitting, and lying down to see how their muscles and joints work. When they are ready, they will be asked to remove their underwear so PT can examine their perineum. The patient is also given the option of providing their own chaperone as one is not provided in our facility. The patient also has the right and is explained the right to defer or refuse any part  of the evaluation or treatment including the internal exam. With the patient's consent, PT will use one gloved finger to gently assess the muscles of the pelvic floor, seeing how well  it contracts and relaxes and if there is muscle symmetry. After, the patient will get dressed and PT and patient will discuss exam findings and plan of care. PT and patient discuss plan of care, schedule, attendance policy and HEP activities.  PELVIC MMT:   MMT eval  Vaginal 2/5; 5s, 4 reps  Internal Anal Sphincter   External Anal Sphincter   Puborectalis   Diastasis Recti   (Blank rows = not tested)        TONE: Slightly increased   PROLAPSE: Not seen in hooklying with cough  TODAY'S TREATMENT:                                                                                                                              DATE:   05/04/23 EVAL Examination completed, findings reviewed, pt educated on POC, HEP. Pt motivated to participate in PT and agreeable to attempt recommendations.    05/11/23: Vibration plate 2x1 min low setting standing, 1 min sitting low setting cues for diaphragmatic breathing and pelvic drops for improved relaxation and decreased pain with internal vaginal manual work Manual at abdomen in lower quadrants for fascial release with good effect, release felt by pt and PT. Patient consented to internal pelvic floor treatment vaginally this date and continues to have tension throughout but seemly better than eval with less pain superficially. Tolerated gentle STM at Lt deep pelvic floor, progressed to anterior pelvic floor on either side of urethra with Rt side more tender and restricted than left. Reports this did replicate abdominal pain felt, released well at Lt side but tension remained at Rt.  05/25/23: Patient consented to internal pelvic floor treatment vaginally this date and continues to have tension throughout. Tolerated gentle STM with focus being on superficial and lt/midline external  pelvic floor. Pt had replicated pain/burning sensation with manual work here and noted great tissue tension here and good release noted. Progressed to clitoral mobilization with pt reporting tightness felt and moderate adhesion at clitoral hood and able to mobilize well without pain. Vibration plate 2x1 min low setting sitting PT present to discuss relaxation techniques, breathing mechanics.   PATIENT EDUCATION:  Education details: feminine moisturizers, pelvic relaxation video  Person educated: Patient Education method: Explanation, Demonstration, Tactile cues, Verbal cues, and Handouts Education comprehension: verbalized understanding, returned demonstration, verbal cues required, tactile cues required, and needs further education  HOME EXERCISE PROGRAM: TBD  ASSESSMENT:  CLINICAL IMPRESSION: Patient is a 53 y.o. female  who was seen today for physical therapy evaluation and treatment for pelvic pain, increased urinary frequency, sensation of incomplete urinary emptying, and weakness in core and hips. Pt session focused on manual with relaxation techniques throughout with NMRE utilized for relearning the down training of pelvic floor and decreased abdominal gripping. Pt tolerated well but did need verbal cues consistently for decreased tension and visualizations for decreased pelvic floor tightening.  Manual completed for decreased pain with vaginal penetration, improved pelvic mobility and  normalized bladder habits. Pt would benefit from additional PT to further address deficits.    OBJECTIVE IMPAIRMENTS: decreased activity tolerance, decreased coordination, decreased endurance, decreased mobility, decreased strength, increased fascial restrictions, increased muscle spasms, impaired flexibility, impaired sensation, improper body mechanics, postural dysfunction, and pain.   ACTIVITY LIMITATIONS: continence, locomotion level, and incontinence   PARTICIPATION LIMITATIONS: interpersonal  relationship and community activity  PERSONAL FACTORS: Past/current experiences, Time since onset of injury/illness/exacerbation, and 1 comorbidity: medical history are also affecting patient's functional outcome.   REHAB POTENTIAL: Good  CLINICAL DECISION MAKING: Evolving/moderate complexity  EVALUATION COMPLEXITY: Moderate   GOALS: Goals reviewed with patient? Yes  SHORT TERM GOALS: Target date: 06/01/23  Pt to be I with HEP for carry over and continuing recommendations for improved outcomes.   Baseline: Goal status: INITIAL  2.  Pt to report improved time between bladder voids to at least 2 hours for improved QOL with decreased urinary frequency.   Baseline:  Goal status: INITIAL  3.  Pt to be I with relaxation techniques and pelvic drops to decreased tension at pelvic floor and improved tolerance to vaginal penetration.  Baseline:  Goal status: INITIAL  4.  Pt to demonstrate ability to complete pelvic floor contraction/relaxation/bulge for full mobility of pelvic floor and decreased tension.  Baseline:  Goal status: INITIAL   LONG TERM GOALS: Target date: 11/03/23  Pt to be I with  advanced HEP for carry over and continuing recommendations for improved outcomes.   Baseline:  Goal status: INITIAL  2.  Pt to report improved time between bladder voids to at least 2.5 hours for improved QOL with decreased urinary frequency.   Baseline:  Goal status: INITIAL  3.  Pt to demonstrate improved coordination of pelvic floor and breathing mechanics with 10# squat with appropriate synergistic patterns to decrease pain  at least 75% of the time for improved ability to complete a 30 minute workout with strain at pelvic floor and symptoms.    Baseline:  Goal status: INITIAL  4.  Pt to report no more than 2/10 pain with use of size 6 vaginal dilator or equivalent for improved tolerance to vaginal penetration for intercourse and medical exams.  Baseline:  Goal status: INITIAL  5.   Pt will be independent with the knack, urge suppression technique, and double voiding in order to improve bladder habits and decrease urinary incontinence.   Baseline:  Goal status: INITIAL   PLAN:  PT FREQUENCY: 2x/week   PT DURATION: 10 sessions  PLANNED INTERVENTIONS: 97110-Therapeutic exercises, 97530- Therapeutic activity, [id number]- Neuromuscular re-education, 97535- Self Care, 16109- Manual therapy, [phone]- Canalith repositioning, [id number]- Aquatic Therapy, [phone]- Electrical stimulation (manual), Patient/Family education, Taping, Dry Needling, Joint mobilization, Spinal mobilization, Scar mobilization, DME instructions, Cryotherapy, Moist heat, and Biofeedback  PLAN FOR NEXT SESSION: relaxation techniques, breathing mechanics, manual at back/hips/abdomen, internal pelvic floor as needed  Avie Lemme, PT, DPT 05/24/2508:37 AM

## 2023-05-26 DIAGNOSIS — D473 Essential (hemorrhagic) thrombocythemia: Principal | ICD-10-CM

## 2023-05-26 DIAGNOSIS — D474 Osteomyelofibrosis: Principal | ICD-10-CM

## 2023-05-26 DIAGNOSIS — M25561 Pain in right knee: Secondary | ICD-10-CM | POA: Diagnosis not present

## 2023-05-26 DIAGNOSIS — M25562 Pain in left knee: Secondary | ICD-10-CM | POA: Diagnosis not present

## 2023-05-26 DIAGNOSIS — M79671 Pain in right foot: Secondary | ICD-10-CM | POA: Diagnosis not present

## 2023-05-26 DIAGNOSIS — M25551 Pain in right hip: Secondary | ICD-10-CM | POA: Diagnosis not present

## 2023-05-26 DIAGNOSIS — M79672 Pain in left foot: Secondary | ICD-10-CM | POA: Diagnosis not present

## 2023-05-26 DIAGNOSIS — M25552 Pain in left hip: Secondary | ICD-10-CM | POA: Diagnosis not present

## 2023-05-28 DIAGNOSIS — D473 Essential (hemorrhagic) thrombocythemia: Principal | ICD-10-CM

## 2023-05-28 DIAGNOSIS — D474 Osteomyelofibrosis: Principal | ICD-10-CM

## 2023-05-28 DIAGNOSIS — Z9484 Stem cells transplant status: Principal | ICD-10-CM

## 2023-05-28 MED ORDER — TACROLIMUS 0.5 MG CAPSULE, IMMEDIATE-RELEASE
ORAL_CAPSULE | ORAL | 1 refills | 0.00000 days | Status: CP
Start: 2023-05-28 — End: ?

## 2023-05-31 DIAGNOSIS — D473 Essential (hemorrhagic) thrombocythemia: Principal | ICD-10-CM

## 2023-05-31 DIAGNOSIS — D474 Osteomyelofibrosis: Principal | ICD-10-CM

## 2023-06-01 DIAGNOSIS — M79672 Pain in left foot: Secondary | ICD-10-CM | POA: Diagnosis not present

## 2023-06-01 DIAGNOSIS — M25551 Pain in right hip: Secondary | ICD-10-CM | POA: Diagnosis not present

## 2023-06-01 DIAGNOSIS — M25552 Pain in left hip: Secondary | ICD-10-CM | POA: Diagnosis not present

## 2023-06-01 DIAGNOSIS — M25562 Pain in left knee: Secondary | ICD-10-CM | POA: Diagnosis not present

## 2023-06-01 DIAGNOSIS — M79671 Pain in right foot: Secondary | ICD-10-CM | POA: Diagnosis not present

## 2023-06-01 DIAGNOSIS — M25561 Pain in right knee: Secondary | ICD-10-CM | POA: Diagnosis not present

## 2023-06-03 DIAGNOSIS — M79671 Pain in right foot: Secondary | ICD-10-CM | POA: Diagnosis not present

## 2023-06-03 DIAGNOSIS — M25552 Pain in left hip: Secondary | ICD-10-CM | POA: Diagnosis not present

## 2023-06-03 DIAGNOSIS — M25551 Pain in right hip: Secondary | ICD-10-CM | POA: Diagnosis not present

## 2023-06-03 DIAGNOSIS — M79672 Pain in left foot: Secondary | ICD-10-CM | POA: Diagnosis not present

## 2023-06-03 DIAGNOSIS — M25562 Pain in left knee: Secondary | ICD-10-CM | POA: Diagnosis not present

## 2023-06-03 DIAGNOSIS — M25561 Pain in right knee: Secondary | ICD-10-CM | POA: Diagnosis not present

## 2023-06-04 DIAGNOSIS — M25562 Pain in left knee: Secondary | ICD-10-CM | POA: Diagnosis not present

## 2023-06-04 DIAGNOSIS — M25551 Pain in right hip: Secondary | ICD-10-CM | POA: Diagnosis not present

## 2023-06-04 DIAGNOSIS — M79671 Pain in right foot: Secondary | ICD-10-CM | POA: Diagnosis not present

## 2023-06-04 DIAGNOSIS — M25552 Pain in left hip: Secondary | ICD-10-CM | POA: Diagnosis not present

## 2023-06-04 DIAGNOSIS — M79672 Pain in left foot: Secondary | ICD-10-CM | POA: Diagnosis not present

## 2023-06-04 DIAGNOSIS — M25561 Pain in right knee: Secondary | ICD-10-CM | POA: Diagnosis not present

## 2023-06-08 DIAGNOSIS — Z9484 Stem cells transplant status: Principal | ICD-10-CM

## 2023-06-08 DIAGNOSIS — D473 Essential (hemorrhagic) thrombocythemia: Principal | ICD-10-CM

## 2023-06-08 DIAGNOSIS — D474 Osteomyelofibrosis: Principal | ICD-10-CM

## 2023-06-08 DIAGNOSIS — M79671 Pain in right foot: Secondary | ICD-10-CM | POA: Diagnosis not present

## 2023-06-08 DIAGNOSIS — M25552 Pain in left hip: Secondary | ICD-10-CM | POA: Diagnosis not present

## 2023-06-08 DIAGNOSIS — M25551 Pain in right hip: Secondary | ICD-10-CM | POA: Diagnosis not present

## 2023-06-08 DIAGNOSIS — M79672 Pain in left foot: Secondary | ICD-10-CM | POA: Diagnosis not present

## 2023-06-08 DIAGNOSIS — M25561 Pain in right knee: Secondary | ICD-10-CM | POA: Diagnosis not present

## 2023-06-08 DIAGNOSIS — M25562 Pain in left knee: Secondary | ICD-10-CM | POA: Diagnosis not present

## 2023-06-08 MED ORDER — MAGNESIUM OXIDE-MAGNESIUM AMINO ACID CHELATE 133 MG TABLET
ORAL_TABLET | ORAL | 0 refills | 33.00000 days | Status: CP
Start: 2023-06-08 — End: ?
  Filled 2023-06-09: qty 200, 33d supply, fill #0

## 2023-06-09 ENCOUNTER — Ambulatory Visit: Payer: Self-pay | Admitting: Physical Therapy

## 2023-06-09 ENCOUNTER — Inpatient Hospital Stay: Admit: 2023-06-09 | Discharge: 2023-06-10 | Payer: PRIVATE HEALTH INSURANCE

## 2023-06-09 ENCOUNTER — Other Ambulatory Visit: Admit: 2023-06-09 | Discharge: 2023-06-10 | Payer: PRIVATE HEALTH INSURANCE

## 2023-06-09 ENCOUNTER — Ambulatory Visit
Admit: 2023-06-09 | Discharge: 2023-06-10 | Payer: PRIVATE HEALTH INSURANCE | Attending: Primary Care | Primary: Primary Care

## 2023-06-09 DIAGNOSIS — D474 Osteomyelofibrosis: Principal | ICD-10-CM

## 2023-06-09 DIAGNOSIS — Z9484 Stem cells transplant status: Principal | ICD-10-CM

## 2023-06-09 DIAGNOSIS — D89813 Graft-versus-host disease, unspecified: Principal | ICD-10-CM

## 2023-06-09 DIAGNOSIS — M1990 Unspecified osteoarthritis, unspecified site: Principal | ICD-10-CM

## 2023-06-09 DIAGNOSIS — D473 Essential (hemorrhagic) thrombocythemia: Principal | ICD-10-CM

## 2023-06-09 DIAGNOSIS — G8929 Other chronic pain: Secondary | ICD-10-CM | POA: Diagnosis not present

## 2023-06-09 DIAGNOSIS — M791 Myalgia, unspecified site: Secondary | ICD-10-CM | POA: Diagnosis not present

## 2023-06-09 DIAGNOSIS — M6281 Muscle weakness (generalized): Secondary | ICD-10-CM | POA: Diagnosis not present

## 2023-06-09 DIAGNOSIS — D61818 Other pancytopenia: Secondary | ICD-10-CM | POA: Diagnosis not present

## 2023-06-09 DIAGNOSIS — R279 Unspecified lack of coordination: Secondary | ICD-10-CM

## 2023-06-09 DIAGNOSIS — R293 Abnormal posture: Secondary | ICD-10-CM

## 2023-06-09 DIAGNOSIS — M62838 Other muscle spasm: Secondary | ICD-10-CM | POA: Diagnosis not present

## 2023-06-09 DIAGNOSIS — D7581 Myelofibrosis: Secondary | ICD-10-CM | POA: Diagnosis not present

## 2023-06-09 DIAGNOSIS — Z5181 Encounter for therapeutic drug level monitoring: Secondary | ICD-10-CM | POA: Diagnosis not present

## 2023-06-09 DIAGNOSIS — R7989 Other specified abnormal findings of blood chemistry: Secondary | ICD-10-CM | POA: Diagnosis not present

## 2023-06-09 DIAGNOSIS — S73191A Other sprain of right hip, initial encounter: Secondary | ICD-10-CM | POA: Diagnosis not present

## 2023-06-09 DIAGNOSIS — M1611 Unilateral primary osteoarthritis, right hip: Secondary | ICD-10-CM | POA: Diagnosis not present

## 2023-06-09 DIAGNOSIS — M2550 Pain in unspecified joint: Secondary | ICD-10-CM | POA: Diagnosis not present

## 2023-06-09 DIAGNOSIS — D84822 Immunodeficiency due to external causes: Secondary | ICD-10-CM | POA: Diagnosis not present

## 2023-06-09 MED ORDER — MONTELUKAST 10 MG TABLET
ORAL_TABLET | Freq: Every evening | ORAL | 2 refills | 30.00000 days | Status: CP
Start: 2023-06-09 — End: 2023-07-09

## 2023-06-09 MED ORDER — TRAMADOL 50 MG TABLET
ORAL_TABLET | Freq: Three times a day (TID) | ORAL | 0 refills | 10.00000 days | Status: CP | PRN
Start: 2023-06-09 — End: ?

## 2023-06-09 MED ORDER — MELOXICAM 7.5 MG TABLET
ORAL_TABLET | Freq: Every day | ORAL | 0 refills | 30.00000 days | Status: CP
Start: 2023-06-09 — End: 2023-07-09

## 2023-06-09 MED FILL — CALCIUM 600 MG (AS CARBONATE)-VITAMIN D3 20 MCG (800 UNIT) TABLET: ORAL | 30 days supply | Qty: 60 | Fill #1

## 2023-06-09 NOTE — Therapy (Signed)
 OUTPATIENT PHYSICAL THERAPY FEMALE PELVIC TREATMENT   Patient Name: Lynn Mccarthy MRN: [id number] DOB:[birth year], 53 y.o., female Today's Date: 06/09/2023  END OF SESSION:  PT End of Session - 06/09/23 0932     Visit Number 4    Date for PT Re-Evaluation 11/03/23    Authorization Type [employer]    PT Start Time 0930    PT Stop Time 1009    PT Time Calculation (min) 39 min    Activity Tolerance Patient tolerated treatment well;No increased pain    Behavior During Therapy [facility] for tasks assessed/performed             Past Medical History:  Diagnosis Date   Bacterial infection    Basal cell carcinoma of skin 06/2011   Candida vaginitis    Fibrocystic breast    GERD (gastroesophageal reflux disease)    History of chicken pox    HPV (human papilloma virus) infection    Iron deficiency anemia    Myelofibrosis (HCC)    Ovarian cyst    Thrombocytosis    Vulvar inflammation    Yeast infection    Past Surgical History:  Procedure Laterality Date   BONE MARROW BIOPSY     SHOULDER SURGERY  11/2009   Patient Active Problem List   Diagnosis Date Noted   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   Chronic left shoulder pain 08/22/2013   Acromioclavicular joint separation 08/22/2013    PCP: Darnelle Elders, PA-C   REFERRING PROVIDER: Marci Setter, NP  REFERRING DIAG: N94.10 (ICD-10-CM) - Unspecified dyspareunia M79.18 (ICD-10-CM) - Myalgia, other site M62.89 (ICD-10-CM) - Other specified disorders of muscle  THERAPY DIAG:  Muscle weakness (generalized)  Unspecified lack of coordination  Abnormal posture  Other muscle spasm  Rationale for Evaluation and Treatment: Rehabilitation  ONSET DATE: several years  SUBJECTIVE:                                                                                                                                                                                           SUBJECTIVE STATEMENT: Had intercourse  about 4-5 days ago and had no pain. Was limited with Rt hip pain (has a partial tear of labrum here) and states she wasn't able to be on top because of pain but had no pain internally. Was not able to climax.   Fluid intake: water - at least 40oz daily  PAIN:  Are you having pain? Yes NPRS scale: constant pressure at 3/10, intercourse 6.5-7/10 Pain location: Internal and Vaginal  Pain type: pressure at all times, shooting with intercourse Pain description: constant   Aggravating factors: penetration Relieving factors: removing  penetration  PRECAUTIONS: Other: cancer treatments  RED FLAGS: None   WEIGHT BEARING RESTRICTIONS: No  FALLS:  Has patient fallen in last 6 months? 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 Candida vaginitis, Fibrocystic breast, HPV,  Myelofibrosis , Vulvar inflammation  Sexual abuse: No  BOWEL MOVEMENT: Pain with bowel movement: No Type of bowel movement:Type (Bristol Stool Scale) 4, Frequency daily, and Strain no Fully empty rectum: Yes:   Leakage: No Pads: No Fiber supplement/laxative magnesium  URINATION: Pain with urination: No Fully empty bladder: Yes: had a residual test and fully empties but reports she feels like there is still urine present Stream: Strong Urgency: No Frequency: urges very often but distraction is helpful, sometimes urinates and within 15 mins has urge to go, most of the time around 2 hours. 1-2x Leakage: none Pads: No  INTERCOURSE:  Ability to have vaginal penetration Yes  Pain with intercourse: Initial Penetration, During Penetration, Deep Penetration, After Intercourse, and Pain  Interrupts Intercourse DrynessYes - uses estrodial and Vaseline at vulva  Climax: external vibration only Marinoff Scale: 2/3 Uses a lubricant  PREGNANCY: Vaginal deliveries 2 Tearing Yes: with both Episiotomy No C-section deliveries 0 Currently pregnant No  PROLAPSE: Pressure   OBJECTIVE:  Note: Objective measures were completed at Evaluation unless otherwise noted.  DIAGNOSTIC FINDINGS:     COGNITION: Overall cognitive status: Within functional limits for tasks assessed     SENSATION: Light touch: Appears intact   FUNCTIONAL TESTS:  Functional squat - decreased descent mildly, mild bil knee valgus    POSTURE: rounded shoulders and forward head   LUMBARAROM/PROM:  A/PROM A/PROM  eval  Flexion WFL  Extension WFL  Right lateral flexion WFL  Left lateral flexion WFL  Right rotation Limited by25%  Left rotation Limited by 25%   (Blank rows = not tested)  LOWER EXTREMITY ROM:  Bil hips grossly limited by 25%  LOWER EXTREMITY MMT:  Bil hips grossly 4/5; knees 4+/5 PALPATION:   General: tightness noted in bil piriformis, bil lumbar paraspinals   Pelvic Alignment: WFL  Abdominal: mild tension in all quadrants but without pain                  External Perineal Exam: no TTP                             Internal Pelvic Floor: TTP in all directions at superficial layer, and bil deep layer  Patient confirms identification and approves PT to assess internal pelvic floor and treatment Yes No emotional/communication barriers or cognitive limitation. Patient is motivated to learn. Patient understands and agrees with treatment goals and plan. PT explains patient will be examined in standing, sitting, and lying down to see how their muscles and joints work. When they are ready, they will be asked to remove their underwear so PT can examine their perineum. The patient is also given the option of providing their own chaperone as one is not provided in our facility.  The patient also has the right and is explained the right to defer or refuse any part of the evaluation or treatment including the internal  exam. With the patient's consent, PT will use one gloved finger to gently assess the muscles of the pelvic floor, seeing how well it contracts and relaxes and if there is muscle symmetry. After, the patient will get dressed and PT and patient will discuss exam findings and plan of care. PT and patient discuss plan of care, schedule, attendance policy and HEP activities.  PELVIC MMT:   MMT eval  Vaginal 2/5; 5s, 4 reps  Internal Anal Sphincter   External Anal Sphincter   Puborectalis   Diastasis Recti   (Blank rows = not tested)        TONE: Slightly increased   PROLAPSE: Not seen in hooklying with cough  TODAY'S TREATMENT:                                                                                                                              DATE:   05/04/23 EVAL Examination completed, findings reviewed, pt educated on POC, HEP. Pt motivated to participate in PT and agreeable to attempt recommendations.    05/11/23: Vibration plate 2x1 min low setting standing, 1 min sitting low setting cues for diaphragmatic breathing and pelvic drops for improved relaxation and decreased pain with internal vaginal manual work Manual at abdomen in lower quadrants for fascial release with good effect, release felt by pt and PT. Patient consented to internal pelvic floor treatment vaginally this date and continues to have tension throughout but seemly better than eval with less pain superficially. Tolerated gentle STM at Lt deep pelvic floor, progressed to anterior pelvic floor on either side of urethra with Rt side more tender and restricted than left. Reports this did replicate abdominal pain felt, released well at Lt side but tension remained at Rt.  05/25/23: Patient consented to internal pelvic floor treatment vaginally this date and continues to have tension  throughout. Tolerated gentle STM with focus being on superficial and lt/midline external pelvic floor. Pt had replicated pain/burning sensation with manual work here and noted great tissue tension here and good release noted. Progressed to clitoral mobilization with pt reporting tightness felt and moderate adhesion at clitoral hood and able to mobilize well without pain. Vibration plate 2x1 min low setting sitting PT present to discuss relaxation techniques, breathing mechanics.  06/09/23: Supine Diaphragmatic Breathing 2x10 with pelvic drops Supine Hip Internal and External Rotation x10 each Happy Baby with Pelvic Floor Lengthening  3x30s Supine Butterfly Stretch  3x30s Thoracic Rotation with Open Book x10 each Cat Cow to Child's Pose  x10 5s holds Single knee to chest 3x20s each Vibration plate all 1 min low setting in sitting: wide knee sitting, piriformis stretch each leg, happy baby Manual rt adductor with tension noted proximally and good release felt  PATIENT EDUCATION:  Education details: feminine moisturizers, pelvic relaxation video  Person educated: Patient Education method: Explanation, Demonstration, Tactile cues, Verbal cues, and Handouts Education comprehension: verbalized understanding, returned demonstration, verbal cues required, tactile cues required, and  needs further education  HOME EXERCISE PROGRAM: [id number]  ASSESSMENT:  CLINICAL IMPRESSION: Patient is a 53 y.o. female  who was seen today for physical therapy treatment for pelvic pain, increased urinary frequency, sensation of incomplete urinary emptying, and weakness in core and hips. Session focused downtraining pelvic floor with diaphragmatic breathing and hip stretching with pelvic drops, vibration plate utilized for tissue release as well. Manual at Rt adductor for improved mobility and tension at pelvis as well with good effect. Pt tolerated well but did need verbal cues consistently for decreased tension and  visualizations for decreased pelvic floor tightening.  Manual completed for decreased pain with vaginal penetration, improved pelvic mobility and normalized bladder habits. Pt would benefit from additional PT to further address deficits.    OBJECTIVE IMPAIRMENTS: decreased activity tolerance, decreased coordination, decreased endurance, decreased mobility, decreased strength, increased fascial restrictions, increased muscle spasms, impaired flexibility, impaired sensation, improper body mechanics, postural dysfunction, and pain.   ACTIVITY LIMITATIONS: continence, locomotion level, and incontinence   PARTICIPATION LIMITATIONS: interpersonal relationship and community activity  PERSONAL FACTORS: Past/current experiences, Time since onset of injury/illness/exacerbation, and 1 comorbidity: medical history are also affecting patient's functional outcome.   REHAB POTENTIAL: Good  CLINICAL DECISION MAKING: Evolving/moderate complexity  EVALUATION COMPLEXITY: Moderate   GOALS: Goals reviewed with patient? Yes  SHORT TERM GOALS: Target date: 06/01/23  Pt to be I with HEP for carry over and continuing recommendations for improved outcomes.   Baseline: Goal status: INITIAL  2.  Pt to report improved time between bladder voids to at least 2 hours for improved QOL with decreased urinary frequency.   Baseline:  Goal status: INITIAL  3.  Pt to be I with relaxation techniques and pelvic drops to decreased tension at pelvic floor and improved tolerance to vaginal penetration.  Baseline:  Goal status: INITIAL  4.  Pt to demonstrate ability to complete pelvic floor contraction/relaxation/bulge for full mobility of pelvic floor and decreased tension.  Baseline:  Goal status: INITIAL   LONG TERM GOALS: Target date: 11/03/23  Pt to be I with  advanced HEP for carry over and continuing recommendations for improved outcomes.   Baseline:  Goal status: INITIAL  2.  Pt to report improved time  between bladder voids to at least 2.5 hours for improved QOL with decreased urinary frequency.   Baseline:  Goal status: INITIAL  3.  Pt to demonstrate improved coordination of pelvic floor and breathing mechanics with 10# squat with appropriate synergistic patterns to decrease pain  at least 75% of the time for improved ability to complete a 30 minute workout with strain at pelvic floor and symptoms.    Baseline:  Goal status: INITIAL  4.  Pt to report no more than 2/10 pain with use of size 6 vaginal dilator or equivalent for improved tolerance to vaginal penetration for intercourse and medical exams.  Baseline:  Goal status: INITIAL  5.  Pt will be independent with the knack, urge suppression technique, and double voiding in order to improve bladder habits and decrease urinary incontinence.   Baseline:  Goal status: INITIAL   PLAN:  PT FREQUENCY: 2x/week   PT DURATION: 10 sessions  PLANNED INTERVENTIONS: 97110-Therapeutic exercises, 97530- Therapeutic activity, [id number]- Neuromuscular re-education, 97535- Self Care, [id number]- Manual therapy, [phone]- Canalith repositioning, [id number]- Aquatic Therapy, [phone]- Electrical stimulation (manual), Patient/Family education, Taping, Dry Needling, Joint mobilization, Spinal mobilization, Scar mobilization, DME instructions, Cryotherapy, Moist heat, and Biofeedback  PLAN FOR NEXT SESSION: relaxation techniques, breathing mechanics, manual at back/hips/abdomen,  internal pelvic floor as needed  Avie Lemme, PT, DPT 06/08/2508:10 AM

## 2023-06-10 DIAGNOSIS — D474 Osteomyelofibrosis: Principal | ICD-10-CM

## 2023-06-10 DIAGNOSIS — D473 Essential (hemorrhagic) thrombocythemia: Principal | ICD-10-CM

## 2023-06-10 DIAGNOSIS — M79671 Pain in right foot: Secondary | ICD-10-CM | POA: Diagnosis not present

## 2023-06-10 DIAGNOSIS — M25561 Pain in right knee: Secondary | ICD-10-CM | POA: Diagnosis not present

## 2023-06-10 DIAGNOSIS — M25551 Pain in right hip: Secondary | ICD-10-CM | POA: Diagnosis not present

## 2023-06-10 DIAGNOSIS — M25552 Pain in left hip: Secondary | ICD-10-CM | POA: Diagnosis not present

## 2023-06-10 DIAGNOSIS — M79672 Pain in left foot: Secondary | ICD-10-CM | POA: Diagnosis not present

## 2023-06-10 DIAGNOSIS — F4321 Adjustment disorder with depressed mood: Secondary | ICD-10-CM | POA: Diagnosis not present

## 2023-06-10 DIAGNOSIS — M25562 Pain in left knee: Secondary | ICD-10-CM | POA: Diagnosis not present

## 2023-06-10 MED ORDER — SERTRALINE 25 MG TABLET
ORAL_TABLET | Freq: Every day | ORAL | 2 refills | 30.00000 days | Status: CP
Start: 2023-06-10 — End: ?

## 2023-06-15 DIAGNOSIS — M25551 Pain in right hip: Secondary | ICD-10-CM | POA: Diagnosis not present

## 2023-06-15 DIAGNOSIS — M25562 Pain in left knee: Secondary | ICD-10-CM | POA: Diagnosis not present

## 2023-06-15 DIAGNOSIS — M79672 Pain in left foot: Secondary | ICD-10-CM | POA: Diagnosis not present

## 2023-06-15 DIAGNOSIS — M79671 Pain in right foot: Secondary | ICD-10-CM | POA: Diagnosis not present

## 2023-06-15 DIAGNOSIS — M25561 Pain in right knee: Secondary | ICD-10-CM | POA: Diagnosis not present

## 2023-06-15 DIAGNOSIS — M25552 Pain in left hip: Secondary | ICD-10-CM | POA: Diagnosis not present

## 2023-06-16 DIAGNOSIS — M25562 Pain in left knee: Secondary | ICD-10-CM | POA: Diagnosis not present

## 2023-06-16 DIAGNOSIS — M79671 Pain in right foot: Secondary | ICD-10-CM | POA: Diagnosis not present

## 2023-06-16 DIAGNOSIS — M25552 Pain in left hip: Secondary | ICD-10-CM | POA: Diagnosis not present

## 2023-06-16 DIAGNOSIS — M79672 Pain in left foot: Secondary | ICD-10-CM | POA: Diagnosis not present

## 2023-06-16 DIAGNOSIS — M25551 Pain in right hip: Secondary | ICD-10-CM | POA: Diagnosis not present

## 2023-06-16 DIAGNOSIS — M25561 Pain in right knee: Secondary | ICD-10-CM | POA: Diagnosis not present

## 2023-06-17 DIAGNOSIS — M79672 Pain in left foot: Secondary | ICD-10-CM | POA: Diagnosis not present

## 2023-06-17 DIAGNOSIS — M79671 Pain in right foot: Secondary | ICD-10-CM | POA: Diagnosis not present

## 2023-06-17 DIAGNOSIS — M25551 Pain in right hip: Secondary | ICD-10-CM | POA: Diagnosis not present

## 2023-06-17 DIAGNOSIS — M25562 Pain in left knee: Secondary | ICD-10-CM | POA: Diagnosis not present

## 2023-06-17 DIAGNOSIS — M25561 Pain in right knee: Secondary | ICD-10-CM | POA: Diagnosis not present

## 2023-06-17 DIAGNOSIS — M25552 Pain in left hip: Secondary | ICD-10-CM | POA: Diagnosis not present

## 2023-06-22 DIAGNOSIS — D473 Essential (hemorrhagic) thrombocythemia: Principal | ICD-10-CM

## 2023-06-22 DIAGNOSIS — D474 Osteomyelofibrosis: Principal | ICD-10-CM

## 2023-06-22 DIAGNOSIS — Z9484 Stem cells transplant status: Principal | ICD-10-CM

## 2023-06-22 DIAGNOSIS — C44319 Basal cell carcinoma of skin of other parts of face: Secondary | ICD-10-CM | POA: Diagnosis not present

## 2023-06-22 DIAGNOSIS — M25551 Pain in right hip: Secondary | ICD-10-CM | POA: Diagnosis not present

## 2023-06-22 DIAGNOSIS — D2272 Melanocytic nevi of left lower limb, including hip: Secondary | ICD-10-CM | POA: Diagnosis not present

## 2023-06-22 DIAGNOSIS — M25561 Pain in right knee: Secondary | ICD-10-CM | POA: Diagnosis not present

## 2023-06-22 DIAGNOSIS — L720 Epidermal cyst: Secondary | ICD-10-CM | POA: Diagnosis not present

## 2023-06-22 DIAGNOSIS — D2262 Melanocytic nevi of left upper limb, including shoulder: Secondary | ICD-10-CM | POA: Diagnosis not present

## 2023-06-22 DIAGNOSIS — M79672 Pain in left foot: Secondary | ICD-10-CM | POA: Diagnosis not present

## 2023-06-22 DIAGNOSIS — L821 Other seborrheic keratosis: Secondary | ICD-10-CM | POA: Diagnosis not present

## 2023-06-22 DIAGNOSIS — D485 Neoplasm of uncertain behavior of skin: Secondary | ICD-10-CM | POA: Diagnosis not present

## 2023-06-22 DIAGNOSIS — M25552 Pain in left hip: Secondary | ICD-10-CM | POA: Diagnosis not present

## 2023-06-22 DIAGNOSIS — Q828 Other specified congenital malformations of skin: Secondary | ICD-10-CM | POA: Diagnosis not present

## 2023-06-22 DIAGNOSIS — L719 Rosacea, unspecified: Secondary | ICD-10-CM | POA: Diagnosis not present

## 2023-06-22 DIAGNOSIS — D2271 Melanocytic nevi of right lower limb, including hip: Secondary | ICD-10-CM | POA: Diagnosis not present

## 2023-06-22 DIAGNOSIS — L578 Other skin changes due to chronic exposure to nonionizing radiation: Secondary | ICD-10-CM | POA: Diagnosis not present

## 2023-06-22 DIAGNOSIS — C44311 Basal cell carcinoma of skin of nose: Secondary | ICD-10-CM | POA: Diagnosis not present

## 2023-06-22 DIAGNOSIS — F4321 Adjustment disorder with depressed mood: Secondary | ICD-10-CM | POA: Diagnosis not present

## 2023-06-22 DIAGNOSIS — M79671 Pain in right foot: Secondary | ICD-10-CM | POA: Diagnosis not present

## 2023-06-22 DIAGNOSIS — D225 Melanocytic nevi of trunk: Secondary | ICD-10-CM | POA: Diagnosis not present

## 2023-06-22 DIAGNOSIS — B078 Other viral warts: Secondary | ICD-10-CM | POA: Diagnosis not present

## 2023-06-22 DIAGNOSIS — M25562 Pain in left knee: Secondary | ICD-10-CM | POA: Diagnosis not present

## 2023-06-23 ENCOUNTER — Other Ambulatory Visit: Admit: 2023-06-23 | Discharge: 2023-06-24 | Payer: PRIVATE HEALTH INSURANCE

## 2023-06-23 ENCOUNTER — Ambulatory Visit: Admit: 2023-06-23 | Discharge: 2023-06-24 | Payer: PRIVATE HEALTH INSURANCE

## 2023-06-23 DIAGNOSIS — D89813 Graft-versus-host disease, unspecified: Principal | ICD-10-CM

## 2023-06-23 DIAGNOSIS — R112 Nausea with vomiting, unspecified: Principal | ICD-10-CM

## 2023-06-23 DIAGNOSIS — D473 Essential (hemorrhagic) thrombocythemia: Principal | ICD-10-CM

## 2023-06-23 DIAGNOSIS — D474 Osteomyelofibrosis: Principal | ICD-10-CM

## 2023-06-23 DIAGNOSIS — Z9484 Stem cells transplant status: Principal | ICD-10-CM

## 2023-06-23 DIAGNOSIS — D7581 Myelofibrosis: Secondary | ICD-10-CM | POA: Diagnosis not present

## 2023-06-23 DIAGNOSIS — D7589 Other specified diseases of blood and blood-forming organs: Secondary | ICD-10-CM | POA: Diagnosis not present

## 2023-06-23 DIAGNOSIS — D84822 Immunodeficiency due to external causes: Secondary | ICD-10-CM | POA: Diagnosis not present

## 2023-06-23 DIAGNOSIS — D61818 Other pancytopenia: Secondary | ICD-10-CM | POA: Diagnosis not present

## 2023-06-23 MED ORDER — ONDANSETRON 8 MG DISINTEGRATING TABLET
ORAL_TABLET | Freq: Two times a day (BID) | ORAL | 1 refills | 15.00000 days | Status: CP | PRN
Start: 2023-06-23 — End: 2023-06-23

## 2023-06-23 MED ORDER — RUXOLITINIB 5 MG TABLET
ORAL_TABLET | Freq: Two times a day (BID) | ORAL | 1 refills | 30.00000 days | Status: CP
Start: 2023-06-23 — End: 2023-08-22

## 2023-06-23 MED ORDER — PANTOPRAZOLE 20 MG TABLET,DELAYED RELEASE
ORAL_TABLET | Freq: Every day | ORAL | 1 refills | 30.00000 days | Status: CP
Start: 2023-06-23 — End: 2024-06-22

## 2023-06-23 MED ORDER — ONDANSETRON HCL 8 MG TABLET
ORAL_TABLET | Freq: Two times a day (BID) | ORAL | 2 refills | 15.00000 days | Status: CP | PRN
Start: 2023-06-23 — End: 2023-06-23

## 2023-06-23 MED ORDER — VALACYCLOVIR 500 MG TABLET
ORAL_TABLET | Freq: Every day | ORAL | 11 refills | 60.00000 days | Status: CP
Start: 2023-06-23 — End: ?

## 2023-06-24 DIAGNOSIS — D89811 Chronic graft-versus-host disease: Principal | ICD-10-CM

## 2023-06-24 DIAGNOSIS — M79671 Pain in right foot: Secondary | ICD-10-CM | POA: Diagnosis not present

## 2023-06-24 DIAGNOSIS — M25561 Pain in right knee: Secondary | ICD-10-CM | POA: Diagnosis not present

## 2023-06-24 DIAGNOSIS — M25551 Pain in right hip: Secondary | ICD-10-CM | POA: Diagnosis not present

## 2023-06-24 DIAGNOSIS — M25552 Pain in left hip: Secondary | ICD-10-CM | POA: Diagnosis not present

## 2023-06-24 DIAGNOSIS — M25562 Pain in left knee: Secondary | ICD-10-CM | POA: Diagnosis not present

## 2023-06-24 DIAGNOSIS — M79672 Pain in left foot: Secondary | ICD-10-CM | POA: Diagnosis not present

## 2023-06-28 ENCOUNTER — Ambulatory Visit: Admitting: Physical Therapy

## 2023-06-28 DIAGNOSIS — N941 Unspecified dyspareunia: Principal | ICD-10-CM

## 2023-06-28 DIAGNOSIS — M7918 Myalgia, other site: Principal | ICD-10-CM

## 2023-06-28 DIAGNOSIS — M62838 Other muscle spasm: Secondary | ICD-10-CM | POA: Insufficient documentation

## 2023-06-28 DIAGNOSIS — M6281 Muscle weakness (generalized): Secondary | ICD-10-CM | POA: Diagnosis not present

## 2023-06-28 DIAGNOSIS — R279 Unspecified lack of coordination: Secondary | ICD-10-CM | POA: Diagnosis not present

## 2023-06-28 MED ORDER — DIAZEPAM 5 MG TABLET
ORAL_TABLET | ORAL | 0 refills | 0.00000 days | Status: CP
Start: 2023-06-28 — End: ?

## 2023-06-28 NOTE — Patient Instructions (Signed)

## 2023-06-28 NOTE — Therapy (Signed)
 OUTPATIENT PHYSICAL THERAPY FEMALE PELVIC TREATMENT   Patient Name: Lynn Mccarthy MRN: [id number] DOB:[birth year], 53 y.o., female Today's Date: 06/28/2023  END OF SESSION:  PT End of Session - 06/28/23 1406     Visit Number 5    Date for PT Re-Evaluation 11/03/23    Authorization Type [employer]    PT Start Time 1404    PT Stop Time 1444    PT Time Calculation (min) 40 min    Activity Tolerance Patient tolerated treatment well;No increased pain    Behavior During Therapy [facility] for tasks assessed/performed           Past Medical History:  Diagnosis Date   Bacterial infection    Basal cell carcinoma of skin 06/2011   Candida vaginitis    Fibrocystic breast    GERD (gastroesophageal reflux disease)    History of chicken pox    HPV (human papilloma virus) infection    Iron deficiency anemia    Myelofibrosis (HCC)    Ovarian cyst    Thrombocytosis    Vulvar inflammation    Yeast infection    Past Surgical History:  Procedure Laterality Date   BONE MARROW BIOPSY     SHOULDER SURGERY  11/2009   Patient Active Problem List   Diagnosis Date Noted   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   Chronic left shoulder pain 08/22/2013   Acromioclavicular joint separation 08/22/2013    PCP: Darnelle Elders, PA-C   REFERRING PROVIDER: Marci Setter, NP  REFERRING DIAG: N94.10 (ICD-10-CM) - Unspecified dyspareunia M79.18 (ICD-10-CM) - Myalgia, other site M62.89 (ICD-10-CM) - Other specified disorders of muscle  THERAPY DIAG:  Muscle weakness (generalized)  Other muscle spasm  Unspecified lack of coordination  Rationale for Evaluation and Treatment: Rehabilitation  ONSET DATE: several years  SUBJECTIVE:                                                                                                                                                                                           SUBJECTIVE STATEMENT: Has had increased urinary  frequency with  up to 5x nightly. And does still have pain with vaginal penetration  Fluid intake: water - at least 40oz daily  PAIN:  Are you having pain? Yes NPRS scale: penetration 4/10, intercourse 2/10 no pressure now Pain location: Internal and Vaginal  Pain type: pressure at all times, shooting with intercourse Pain description: constant   Aggravating factors: penetration Relieving factors: removing penetration  PRECAUTIONS: Other: cancer treatments  RED FLAGS: None   WEIGHT BEARING RESTRICTIONS: No  FALLS:  Has patient fallen in last 6 months? 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 Candida vaginitis, Fibrocystic breast, HPV,  Myelofibrosis , Vulvar inflammation  Sexual abuse: No  BOWEL MOVEMENT: Pain with bowel movement: No Type of bowel movement:Type (Bristol Stool Scale) 4, Frequency daily, and Strain no Fully empty rectum: Yes:   Leakage: No Pads: No Fiber supplement/laxative magnesium  URINATION: Pain with urination: No Fully empty bladder: Yes: had a residual test and fully empties but reports she feels like there is still urine present Stream: Strong Urgency: No Frequency: urges very often but distraction is helpful, sometimes urinates and within 15 mins has urge to go, most of the time around 2 hours. 1-2x Leakage: none Pads: No  INTERCOURSE:  Ability to have vaginal penetration Yes  Pain with intercourse: Initial Penetration, During Penetration, Deep Penetration, After Intercourse, and Pain Interrupts Intercourse DrynessYes - uses estrodial and Vaseline at vulva  Climax: external vibration only Marinoff Scale: 2/3 Uses a  lubricant  PREGNANCY: Vaginal deliveries 2 Tearing Yes: with both Episiotomy No C-section deliveries 0 Currently pregnant No  PROLAPSE: Pressure   OBJECTIVE:  Note: Objective measures were completed at Evaluation unless otherwise noted.  DIAGNOSTIC FINDINGS:     COGNITION: Overall cognitive status: Within functional limits for tasks assessed     SENSATION: Light touch: Appears intact   FUNCTIONAL TESTS:  Functional squat - decreased descent mildly, mild bil knee valgus    POSTURE: rounded shoulders and forward head   LUMBARAROM/PROM:  A/PROM A/PROM  eval  Flexion WFL  Extension WFL  Right lateral flexion WFL  Left lateral flexion WFL  Right rotation Limited by25%  Left rotation Limited by 25%   (Blank rows = not tested)  LOWER EXTREMITY ROM:  Bil hips grossly limited by 25%  LOWER EXTREMITY MMT:  Bil hips grossly 4/5; knees 4+/5 PALPATION:   General: tightness noted in bil piriformis, bil lumbar paraspinals   Pelvic Alignment: WFL  Abdominal: mild tension in all quadrants but without pain                  External Perineal Exam: no TTP                             Internal Pelvic Floor: TTP in all directions at superficial layer, and bil deep layer  Patient confirms identification and approves PT to assess internal pelvic floor and treatment Yes No emotional/communication barriers or cognitive limitation. Patient is motivated to learn. Patient understands and agrees with treatment goals and plan. PT explains patient will be examined in standing, sitting, and lying down to see how their muscles and joints work. When they are ready, they will be asked to remove their underwear so PT can examine their perineum. The patient is also given the option of providing their own chaperone as one is not provided in our facility. The patient also has the right and is explained the right to defer or refuse any part of the evaluation or treatment including the internal  exam. With the patient's consent, PT will use one gloved finger to gently assess the muscles of the pelvic floor, seeing how well it contracts and relaxes and if there is muscle symmetry.  After, the patient will get dressed and PT and patient will discuss exam findings and plan of care. PT and patient discuss plan of care, schedule, attendance policy and HEP activities.  PELVIC MMT:   MMT eval  Vaginal 2/5; 5s, 4 reps  Internal Anal Sphincter   External Anal Sphincter   Puborectalis   Diastasis Recti   (Blank rows = not tested)        TONE: Slightly increased   PROLAPSE: Not seen in hooklying with cough  TODAY'S TREATMENT:                                                                                                                              DATE:    05/25/23: Patient consented to internal pelvic floor treatment vaginally this date and continues to have tension throughout. Tolerated gentle STM with focus being on superficial and lt/midline external pelvic floor. Pt had replicated pain/burning sensation with manual work here and noted great tissue tension here and good release noted. Progressed to clitoral mobilization with pt reporting tightness felt and moderate adhesion at clitoral hood and able to mobilize well without pain. Vibration plate 2x1 min low setting sitting PT present to discuss relaxation techniques, breathing mechanics.  06/09/23: Supine Diaphragmatic Breathing 2x10 with pelvic drops Supine Hip Internal and External Rotation x10 each Happy Baby with Pelvic Floor Lengthening  3x30s Supine Butterfly Stretch  3x30s Thoracic Rotation with Open Book x10 each Cat Cow to Child's Pose  x10 5s holds Single knee to chest 3x20s each Vibration plate all 1 min low setting in sitting: wide knee sitting, piriformis stretch each leg, happy baby Manual rt adductor with tension noted proximally and good release felt  06/28/23: Manual at abdomen with pt in supine for improved lower  abdominal tissue/fascial mobility for decreased urinary frequency and urgency. Pt did have tension at bil sides and midline but worse at Lt lateral side. Released well and pt reports decline in urge to urinate.  Patient consented to internal pelvic floor treatment vaginally this date with focus at introitus/bil bulbocavernosus for improved tolerance and less pain to penetration. Pt had high pain with initially palpation however with cues for diaphragmatic breathing, relaxation techs, and gentle stretching decreased with greatly improved mobility to 1/10. Progressed to bil urethra sides with lt side much tighter than Rt and gentle STM/release work completed here with good effect.   PATIENT EDUCATION:  Education details: feminine moisturizers, pelvic relaxation video  Person educated: Patient Education method: Explanation, Demonstration, Tactile cues, Verbal cues, and Handouts Education comprehension: verbalized understanding, returned demonstration, verbal cues required, tactile cues required, and needs further education  HOME EXERCISE PROGRAM: [id number]  ASSESSMENT:  CLINICAL IMPRESSION: Patient is a 53 y.o. female  who was seen today for physical therapy treatment for pelvic pain, increased urinary frequency, sensation of incomplete urinary emptying, and weakness in core and hips. Session focused downtraining pelvic floor with diaphragmatic breathing and internal and external Manual completed  for decreased pain with vaginal penetration, improved pelvic mobility and normalized bladder habits. Pt does report she was having great results with wand use with less pelvic pain but has stopped recently, will resume. Pt would benefit from additional PT to further address deficits.    OBJECTIVE IMPAIRMENTS: decreased activity tolerance, decreased coordination, decreased endurance, decreased mobility, decreased strength, increased fascial restrictions, increased muscle spasms, impaired flexibility, impaired  sensation, improper body mechanics, postural dysfunction, and pain.   ACTIVITY LIMITATIONS: continence, locomotion level, and incontinence   PARTICIPATION LIMITATIONS: interpersonal relationship and community activity  PERSONAL FACTORS: Past/current experiences, Time since onset of injury/illness/exacerbation, and 1 comorbidity: medical history are also affecting patient's functional outcome.   REHAB POTENTIAL: Good  CLINICAL DECISION MAKING: Evolving/moderate complexity  EVALUATION COMPLEXITY: Moderate   GOALS: Goals reviewed with patient? Yes  SHORT TERM GOALS: Target date: 06/01/23  Pt to be I with HEP for carry over and continuing recommendations for improved outcomes.   Baseline: Goal status: INITIAL  2.  Pt to report improved time between bladder voids to at least 2 hours for improved QOL with decreased urinary frequency.   Baseline:  Goal status: INITIAL  3.  Pt to be I with relaxation techniques and pelvic drops to decreased tension at pelvic floor and improved tolerance to vaginal penetration.  Baseline:  Goal status: INITIAL  4.  Pt to demonstrate ability to complete pelvic floor contraction/relaxation/bulge for full mobility of pelvic floor and decreased tension.  Baseline:  Goal status: INITIAL   LONG TERM GOALS: Target date: 11/03/23  Pt to be I with  advanced HEP for carry over and continuing recommendations for improved outcomes.   Baseline:  Goal status: INITIAL  2.  Pt to report improved time between bladder voids to at least 2.5 hours for improved QOL with decreased urinary frequency.   Baseline:  Goal status: INITIAL  3.  Pt to demonstrate improved coordination of pelvic floor and breathing mechanics with 10# squat with appropriate synergistic patterns to decrease pain  at least 75% of the time for improved ability to complete a 30 minute workout with strain at pelvic floor and symptoms.    Baseline:  Goal status: INITIAL  4.  Pt to report no more  than 2/10 pain with use of size 6 vaginal dilator or equivalent for improved tolerance to vaginal penetration for intercourse and medical exams.  Baseline:  Goal status: INITIAL  5.  Pt will be independent with the knack, urge suppression technique, and double voiding in order to improve bladder habits and decrease urinary incontinence.   Baseline:  Goal status: INITIAL   PLAN:  PT FREQUENCY: 2x/week   PT DURATION: 10 sessions  PLANNED INTERVENTIONS: 97110-Therapeutic exercises, 97530- Therapeutic activity, [id number]- Neuromuscular re-education, 97535- Self Care, 29562- Manual therapy, [phone]- Canalith repositioning, [id number]- Aquatic Therapy, [phone]- Electrical stimulation (manual), Patient/Family education, Taping, Dry Needling, Joint mobilization, Spinal mobilization, Scar mobilization, DME instructions, Cryotherapy, Moist heat, and Biofeedback  PLAN FOR NEXT SESSION: relaxation techniques, breathing mechanics, manual at back/hips/abdomen, internal pelvic floor as needed  Avie Lemme, PT, DPT 06/16/252:50 PM

## 2023-06-29 DIAGNOSIS — M25561 Pain in right knee: Secondary | ICD-10-CM | POA: Diagnosis not present

## 2023-06-29 DIAGNOSIS — F4321 Adjustment disorder with depressed mood: Secondary | ICD-10-CM | POA: Diagnosis not present

## 2023-06-29 DIAGNOSIS — M79672 Pain in left foot: Secondary | ICD-10-CM | POA: Diagnosis not present

## 2023-06-29 DIAGNOSIS — M79671 Pain in right foot: Secondary | ICD-10-CM | POA: Diagnosis not present

## 2023-06-29 DIAGNOSIS — M25551 Pain in right hip: Secondary | ICD-10-CM | POA: Diagnosis not present

## 2023-06-29 DIAGNOSIS — M25562 Pain in left knee: Secondary | ICD-10-CM | POA: Diagnosis not present

## 2023-06-29 DIAGNOSIS — M25552 Pain in left hip: Secondary | ICD-10-CM | POA: Diagnosis not present

## 2023-06-30 ENCOUNTER — Encounter: Admitting: Physical Therapy

## 2023-06-30 ENCOUNTER — Other Ambulatory Visit: Admit: 2023-06-30 | Discharge: 2023-07-01 | Payer: PRIVATE HEALTH INSURANCE

## 2023-06-30 ENCOUNTER — Ambulatory Visit: Admit: 2023-06-30 | Discharge: 2023-07-01 | Payer: PRIVATE HEALTH INSURANCE

## 2023-06-30 DIAGNOSIS — Z9484 Stem cells transplant status: Principal | ICD-10-CM

## 2023-06-30 DIAGNOSIS — D89813 Graft-versus-host disease, unspecified: Principal | ICD-10-CM

## 2023-06-30 DIAGNOSIS — D473 Essential (hemorrhagic) thrombocythemia: Principal | ICD-10-CM

## 2023-06-30 DIAGNOSIS — D474 Osteomyelofibrosis: Principal | ICD-10-CM

## 2023-06-30 DIAGNOSIS — D84822 Immunodeficiency due to external causes: Secondary | ICD-10-CM | POA: Diagnosis not present

## 2023-06-30 DIAGNOSIS — Z5181 Encounter for therapeutic drug level monitoring: Secondary | ICD-10-CM | POA: Diagnosis not present

## 2023-07-01 DIAGNOSIS — M79671 Pain in right foot: Secondary | ICD-10-CM | POA: Diagnosis not present

## 2023-07-01 DIAGNOSIS — M25562 Pain in left knee: Secondary | ICD-10-CM | POA: Diagnosis not present

## 2023-07-01 DIAGNOSIS — M25561 Pain in right knee: Secondary | ICD-10-CM | POA: Diagnosis not present

## 2023-07-01 DIAGNOSIS — M25552 Pain in left hip: Secondary | ICD-10-CM | POA: Diagnosis not present

## 2023-07-01 DIAGNOSIS — M25551 Pain in right hip: Secondary | ICD-10-CM | POA: Diagnosis not present

## 2023-07-01 DIAGNOSIS — M79672 Pain in left foot: Secondary | ICD-10-CM | POA: Diagnosis not present

## 2023-07-05 DIAGNOSIS — Z9484 Stem cells transplant status: Principal | ICD-10-CM

## 2023-07-05 MED ORDER — SERTRALINE 25 MG TABLET
ORAL_TABLET | Freq: Every day | ORAL | 1 refills | 90.00000 days | Status: CP
Start: 2023-07-05 — End: ?

## 2023-07-07 DIAGNOSIS — N958 Other specified menopausal and perimenopausal disorders: Principal | ICD-10-CM

## 2023-07-07 DIAGNOSIS — M7918 Myalgia, other site: Principal | ICD-10-CM

## 2023-07-07 DIAGNOSIS — M6289 Other specified disorders of muscle: Principal | ICD-10-CM

## 2023-07-07 DIAGNOSIS — N941 Unspecified dyspareunia: Principal | ICD-10-CM

## 2023-07-09 DIAGNOSIS — D474 Osteomyelofibrosis: Principal | ICD-10-CM

## 2023-07-09 DIAGNOSIS — D473 Essential (hemorrhagic) thrombocythemia: Principal | ICD-10-CM

## 2023-07-10 DIAGNOSIS — D473 Essential (hemorrhagic) thrombocythemia: Principal | ICD-10-CM

## 2023-07-10 DIAGNOSIS — Z9484 Stem cells transplant status: Principal | ICD-10-CM

## 2023-07-10 DIAGNOSIS — D474 Osteomyelofibrosis: Principal | ICD-10-CM

## 2023-07-10 MED ORDER — MG-PLUS-PROTEIN 133 MG TABLET
ORAL_TABLET | ORAL | 0 refills | 33.00000 days
Start: 2023-07-10 — End: ?

## 2023-07-11 DIAGNOSIS — D473 Essential (hemorrhagic) thrombocythemia: Principal | ICD-10-CM

## 2023-07-11 DIAGNOSIS — D474 Osteomyelofibrosis: Principal | ICD-10-CM

## 2023-07-11 MED ORDER — MG-PLUS-PROTEIN 133 MG TABLET
ORAL_TABLET | ORAL | 0 refills | 33.00000 days | Status: CP
Start: 2023-07-11 — End: ?
  Filled 2023-07-14: qty 200, 33d supply, fill #0

## 2023-07-12 ENCOUNTER — Ambulatory Visit: Admitting: Physical Therapy

## 2023-07-12 DIAGNOSIS — R279 Unspecified lack of coordination: Secondary | ICD-10-CM

## 2023-07-12 DIAGNOSIS — M6281 Muscle weakness (generalized): Secondary | ICD-10-CM

## 2023-07-12 DIAGNOSIS — M62838 Other muscle spasm: Secondary | ICD-10-CM | POA: Diagnosis not present

## 2023-07-12 NOTE — Therapy (Signed)
 OUTPATIENT PHYSICAL THERAPY FEMALE PELVIC TREATMENT   Patient Name: Seylah Wernert MRN: [id number] DOB:[birth year], 53 y.o., female Today's Date: 07/12/2023  END OF SESSION:  PT End of Session - 07/12/23 1152     Visit Number 6    Date for PT Re-Evaluation 11/03/23    Authorization Type [employer]    PT Start Time 1146    PT Stop Time 1225    PT Time Calculation (min) 39 min    Activity Tolerance Patient tolerated treatment well;No increased pain    Behavior During Therapy [facility] for tasks assessed/performed           Past Medical History:  Diagnosis Date   Bacterial infection    Basal cell carcinoma of skin 06/2011   Candida vaginitis    Fibrocystic breast    GERD (gastroesophageal reflux disease)    History of chicken pox    HPV (human papilloma virus) infection    Iron deficiency anemia    Myelofibrosis (HCC)    Ovarian cyst    Thrombocytosis    Vulvar inflammation    Yeast infection    Past Surgical History:  Procedure Laterality Date   BONE MARROW BIOPSY     SHOULDER SURGERY  11/2009   Patient Active Problem List   Diagnosis Date Noted   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   Chronic left shoulder pain 08/22/2013   Acromioclavicular joint separation 08/22/2013    PCP: Katina Pfeiffer, PA-C   REFERRING PROVIDER: Lord Powell CROME, NP  REFERRING DIAG: N94.10 (ICD-10-CM) - Unspecified dyspareunia M79.18 (ICD-10-CM) - Myalgia, other site M62.89 (ICD-10-CM) - Other specified disorders of muscle  THERAPY DIAG:  Other muscle spasm  Muscle weakness (generalized)  Unspecified lack of coordination  Rationale for Evaluation and Treatment: Rehabilitation  ONSET DATE: several years  SUBJECTIVE:                                                                                                                                                                                           SUBJECTIVE STATEMENT: Thought she was doing a lot better, but  did have intercourse for the first time in 2 weeks and it was painful. Pt reports sharp pain almost like needles throughout intercourse mostly with deep penetration.   Fluid intake: water - at least 40oz daily  PAIN:  Are you having pain? Yes NPRS scale: penetration 0/10, intercourse 4.5/10  Pain location: Internal and Vaginal  Pain type: sharp, needles/pins Pain description: constant   Aggravating factors: intercourse Relieving factors: removing penetration  PRECAUTIONS: Other: cancer treatments  RED FLAGS: None   WEIGHT BEARING RESTRICTIONS: No  FALLS:  Has patient  fallen in last 6 months? 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 Candida vaginitis, Fibrocystic breast, HPV,  Myelofibrosis , Vulvar inflammation  Sexual abuse: No  BOWEL MOVEMENT: Pain with bowel movement: No Type of bowel movement:Type (Bristol Stool Scale) 4, Frequency daily, and Strain no Fully empty rectum: Yes:   Leakage: No Pads: No Fiber supplement/laxative magnesium  URINATION: Pain with urination: No Fully empty bladder: Yes: had a residual test and fully empties but reports she feels like there is still urine present Stream: Strong Urgency: No Frequency: urges very often but distraction is helpful, sometimes urinates and within 15 mins has urge to go, most of the time around 2 hours. 1-2x Leakage: none Pads: No  INTERCOURSE:  Ability to have vaginal penetration Yes  Pain with intercourse: Initial Penetration, During Penetration, Deep Penetration, After Intercourse, and Pain Interrupts Intercourse DrynessYes - uses estrodial and Vaseline at vulva  Climax:  external vibration only Marinoff Scale: 2/3 Uses a lubricant  PREGNANCY: Vaginal deliveries 2 Tearing Yes: with both Episiotomy No C-section deliveries 0 Currently pregnant No  PROLAPSE: Pressure   OBJECTIVE:  Note: Objective measures were completed at Evaluation unless otherwise noted.  DIAGNOSTIC FINDINGS:     COGNITION: Overall cognitive status: Within functional limits for tasks assessed     SENSATION: Light touch: Appears intact   FUNCTIONAL TESTS:  Functional squat - decreased descent mildly, mild bil knee valgus    POSTURE: rounded shoulders and forward head   LUMBARAROM/PROM:  A/PROM A/PROM  eval  Flexion WFL  Extension WFL  Right lateral flexion WFL  Left lateral flexion WFL  Right rotation Limited by25%  Left rotation Limited by 25%   (Blank rows = not tested)  LOWER EXTREMITY ROM:  Bil hips grossly limited by 25%  LOWER EXTREMITY MMT:  Bil hips grossly 4/5; knees 4+/5 PALPATION:   General: tightness noted in bil piriformis, bil lumbar paraspinals   Pelvic Alignment: WFL  Abdominal: mild tension in all quadrants but without pain                  External Perineal Exam: no TTP                             Internal Pelvic Floor: TTP in all directions at superficial layer, and bil deep layer  Patient confirms identification and approves PT to assess internal pelvic floor and treatment Yes No emotional/communication barriers or cognitive limitation. Patient is motivated to learn. Patient understands and agrees with treatment goals and plan. PT explains patient will be examined in standing, sitting, and lying down to see how their muscles and joints work. When they are ready, they will be asked to remove their underwear so PT can examine their perineum. The patient is also given the option of providing their own chaperone as one is not provided in our facility. The patient also has the right and is explained the right to defer or refuse any part  of the evaluation or treatment including the internal exam. With the patient's consent, PT will use one gloved finger to gently assess the muscles of the pelvic floor, seeing  how well it contracts and relaxes and if there is muscle symmetry. After, the patient will get dressed and PT and patient will discuss exam findings and plan of care. PT and patient discuss plan of care, schedule, attendance policy and HEP activities.  PELVIC MMT:   MMT eval  Vaginal 2/5; 5s, 4 reps  Internal Anal Sphincter   External Anal Sphincter   Puborectalis   Diastasis Recti   (Blank rows = not tested)        TONE: Slightly increased   PROLAPSE: Not seen in hooklying with cough  TODAY'S TREATMENT:                                                                                                                              DATE:     06/09/23: Supine Diaphragmatic Breathing 2x10 with pelvic drops Supine Hip Internal and External Rotation x10 each Happy Baby with Pelvic Floor Lengthening  3x30s Supine Butterfly Stretch  3x30s Thoracic Rotation with Open Book x10 each Cat Cow to Child's Pose  x10 5s holds Single knee to chest 3x20s each Vibration plate all 1 min low setting in sitting: wide knee sitting, piriformis stretch each leg, happy baby Manual rt adductor with tension noted proximally and good release felt  06/28/23: Manual at abdomen with pt in supine for improved lower abdominal tissue/fascial mobility for decreased urinary frequency and urgency. Pt did have tension at bil sides and midline but worse at Lt lateral side. Released well and pt reports decline in urge to urinate.  Patient consented to internal pelvic floor treatment vaginally this date with focus at introitus/bil bulbocavernosus for improved tolerance and less pain to penetration. Pt had high pain with initially palpation however with cues for diaphragmatic breathing, relaxation techs, and gentle stretching decreased with greatly improved  mobility to 1/10. Progressed to bil urethra sides with lt side much tighter than Rt and gentle STM/release work completed here with good effect.   07/12/23: Patient consented to internal pelvic floor treatment vaginally this date with focus at deep pelvic floor today as pt reports no pain with penetration with intercourse recently and no pain with palpation today. Rt and midline deep pelvic floor were pt's painful areas, tension noted at Rt obturator. Pt educated on wand use here. Also TTP and replicated pain per pt, with palpation of lateral sides of cervix and gentle mobility at bil to cervix with good improvement noted and pt reported decreased pain at end of session Pt educated on possible benefits for [place], or dilators vs wand for improved deep pelvic floor mobility. Pt reports no pain with penetration and only depth this last time and educated on difference in wand and dilators but could attempt use of wand deeper within tolerance for gentle stretch in that area and pt agreed to attempt.    PATIENT EDUCATION:  Education details: feminine moisturizers, pelvic relaxation video  Person educated: Patient  Education method: Explanation, Demonstration, Tactile cues,  Verbal cues, and Handouts Education comprehension: verbalized understanding, returned demonstration, verbal cues required, tactile cues required, and needs further education  HOME EXERCISE PROGRAM: [id number]  ASSESSMENT:  CLINICAL IMPRESSION: Patient is a 54 y.o. female  who was seen today for physical therapy treatment for pelvic pain, increased urinary frequency, sensation of incomplete urinary emptying, and weakness in core and hips. Session focused downtraining pelvic floor with diaphragmatic breathing and internal Manual completed for decreased pain with vaginal deep penetration. Pt does report she was having continued improvement, a little disappointed in pain this last time but pleased no longer having penetration. Improved with  tissue mobility during session with less pain and more mobility at end of manual. Pt would benefit from additional PT to further address deficits.    OBJECTIVE IMPAIRMENTS: decreased activity tolerance, decreased coordination, decreased endurance, decreased mobility, decreased strength, increased fascial restrictions, increased muscle spasms, impaired flexibility, impaired sensation, improper body mechanics, postural dysfunction, and pain.   ACTIVITY LIMITATIONS: continence, locomotion level, and incontinence   PARTICIPATION LIMITATIONS: interpersonal relationship and community activity  PERSONAL FACTORS: Past/current experiences, Time since onset of injury/illness/exacerbation, and 1 comorbidity: medical history are also affecting patient's functional outcome.   REHAB POTENTIAL: Good  CLINICAL DECISION MAKING: Evolving/moderate complexity  EVALUATION COMPLEXITY: Moderate   GOALS: Goals reviewed with patient? Yes  SHORT TERM GOALS: Target date: 06/01/23  Pt to be I with HEP for carry over and continuing recommendations for improved outcomes.   Baseline: Goal status: INITIAL  2.  Pt to report improved time between bladder voids to at least 2 hours for improved QOL with decreased urinary frequency.   Baseline:  Goal status: INITIAL  3.  Pt to be I with relaxation techniques and pelvic drops to decreased tension at pelvic floor and improved tolerance to vaginal penetration.  Baseline:  Goal status: INITIAL  4.  Pt to demonstrate ability to complete pelvic floor contraction/relaxation/bulge for full mobility of pelvic floor and decreased tension.  Baseline:  Goal status: INITIAL   LONG TERM GOALS: Target date: 11/03/23  Pt to be I with  advanced HEP for carry over and continuing recommendations for improved outcomes.   Baseline:  Goal status: INITIAL  2.  Pt to report improved time between bladder voids to at least 2.5 hours for improved QOL with decreased urinary frequency.    Baseline:  Goal status: INITIAL  3.  Pt to demonstrate improved coordination of pelvic floor and breathing mechanics with 10# squat with appropriate synergistic patterns to decrease pain  at least 75% of the time for improved ability to complete a 30 minute workout with strain at pelvic floor and symptoms.    Baseline:  Goal status: INITIAL  4.  Pt to report no more than 2/10 pain with use of size 6 vaginal dilator or equivalent for improved tolerance to vaginal penetration for intercourse and medical exams.  Baseline:  Goal status: INITIAL  5.  Pt will be independent with the knack, urge suppression technique, and double voiding in order to improve bladder habits and decrease urinary incontinence.   Baseline:  Goal status: INITIAL   PLAN:  PT FREQUENCY: 2x/week   PT DURATION: 10 sessions  PLANNED INTERVENTIONS: 97110-Therapeutic exercises, 97530- Therapeutic activity, [id number]- Neuromuscular re-education, 97535- Self Care, [id number]- Manual therapy, [phone]- Canalith repositioning, [id number]- Aquatic Therapy, [phone]- Electrical stimulation (manual), Patient/Family education, Taping, Dry Needling, Joint mobilization, Spinal mobilization, Scar mobilization, DME instructions, Cryotherapy, Moist heat, and Biofeedback  PLAN FOR NEXT SESSION: relaxation techniques, breathing mechanics, manual at  back/hips/abdomen, internal pelvic floor as needed  Darryle Navy, PT, DPT 06/30/251:09 PM

## 2023-07-13 DIAGNOSIS — D473 Essential (hemorrhagic) thrombocythemia: Principal | ICD-10-CM

## 2023-07-13 DIAGNOSIS — D474 Osteomyelofibrosis: Principal | ICD-10-CM

## 2023-07-13 DIAGNOSIS — M79671 Pain in right foot: Secondary | ICD-10-CM | POA: Diagnosis not present

## 2023-07-13 DIAGNOSIS — M25551 Pain in right hip: Secondary | ICD-10-CM | POA: Diagnosis not present

## 2023-07-13 DIAGNOSIS — M79672 Pain in left foot: Secondary | ICD-10-CM | POA: Diagnosis not present

## 2023-07-13 DIAGNOSIS — M25552 Pain in left hip: Secondary | ICD-10-CM | POA: Diagnosis not present

## 2023-07-13 DIAGNOSIS — M25562 Pain in left knee: Secondary | ICD-10-CM | POA: Diagnosis not present

## 2023-07-13 DIAGNOSIS — M25561 Pain in right knee: Secondary | ICD-10-CM | POA: Diagnosis not present

## 2023-07-13 DIAGNOSIS — F4321 Adjustment disorder with depressed mood: Secondary | ICD-10-CM | POA: Diagnosis not present

## 2023-07-14 ENCOUNTER — Encounter: Admitting: Physical Therapy

## 2023-07-14 ENCOUNTER — Ambulatory Visit: Admit: 2023-07-14 | Discharge: 2023-07-14 | Payer: PRIVATE HEALTH INSURANCE

## 2023-07-14 ENCOUNTER — Other Ambulatory Visit: Admit: 2023-07-14 | Discharge: 2023-07-14 | Payer: PRIVATE HEALTH INSURANCE

## 2023-07-14 ENCOUNTER — Inpatient Hospital Stay: Admit: 2023-07-14 | Discharge: 2023-07-14 | Payer: PRIVATE HEALTH INSURANCE

## 2023-07-14 DIAGNOSIS — Z9484 Stem cells transplant status: Principal | ICD-10-CM

## 2023-07-14 DIAGNOSIS — D474 Osteomyelofibrosis: Principal | ICD-10-CM

## 2023-07-14 DIAGNOSIS — D473 Essential (hemorrhagic) thrombocythemia: Principal | ICD-10-CM

## 2023-07-14 DIAGNOSIS — D89813 Graft-versus-host disease, unspecified: Principal | ICD-10-CM

## 2023-07-14 DIAGNOSIS — D84822 Immunodeficiency due to external causes: Secondary | ICD-10-CM | POA: Diagnosis not present

## 2023-07-14 DIAGNOSIS — Z5181 Encounter for therapeutic drug level monitoring: Secondary | ICD-10-CM | POA: Diagnosis not present

## 2023-07-14 MED ORDER — OLANZAPINE 5 MG TABLET
ORAL_TABLET | Freq: Every evening | ORAL | 1 refills | 0.00000 days
Start: 2023-07-14 — End: ?

## 2023-07-14 MED ORDER — RUXOLITINIB 5 MG TABLET
ORAL_TABLET | Freq: Two times a day (BID) | ORAL | 1 refills | 30.00000 days | Status: CP
Start: 2023-07-14 — End: 2023-09-12

## 2023-07-14 MED FILL — CALCIUM 600 MG (AS CARBONATE)-VITAMIN D3 20 MCG (800 UNIT) TABLET: ORAL | 30 days supply | Qty: 60 | Fill #2

## 2023-07-15 MED ORDER — OLANZAPINE 5 MG TABLET
ORAL_TABLET | Freq: Every evening | ORAL | 1 refills | 90.00000 days
Start: 2023-07-15 — End: ?

## 2023-07-19 DIAGNOSIS — D474 Osteomyelofibrosis: Principal | ICD-10-CM

## 2023-07-19 DIAGNOSIS — D473 Essential (hemorrhagic) thrombocythemia: Principal | ICD-10-CM

## 2023-07-20 ENCOUNTER — Ambulatory Visit: Admitting: Physical Therapy

## 2023-07-20 DIAGNOSIS — R279 Unspecified lack of coordination: Secondary | ICD-10-CM | POA: Diagnosis not present

## 2023-07-20 DIAGNOSIS — M62838 Other muscle spasm: Secondary | ICD-10-CM | POA: Insufficient documentation

## 2023-07-20 DIAGNOSIS — R293 Abnormal posture: Secondary | ICD-10-CM | POA: Diagnosis not present

## 2023-07-20 DIAGNOSIS — M6281 Muscle weakness (generalized): Secondary | ICD-10-CM | POA: Insufficient documentation

## 2023-07-20 DIAGNOSIS — F4321 Adjustment disorder with depressed mood: Secondary | ICD-10-CM | POA: Diagnosis not present

## 2023-07-20 NOTE — Therapy (Signed)
 OUTPATIENT PHYSICAL THERAPY FEMALE PELVIC TREATMENT   Patient Name: Lynn Mccarthy MRN: [id number] DOB:Mar 27, 1970, 53 y.o., female Today's Date: 07/20/2023  END OF SESSION:  PT End of Session - 07/20/23 0938     Visit Number 7    Date for PT Re-Evaluation 11/03/23    Authorization Type [employer]    PT Start Time [phone]    PT Stop Time 1013    PT Time Calculation (min) 40 min    Activity Tolerance Patient tolerated treatment well;No increased pain    Behavior During Therapy [facility] for tasks assessed/performed            Past Medical History:  Diagnosis Date   Bacterial infection    Basal cell carcinoma of skin 06/2011   Candida vaginitis    Fibrocystic breast    GERD (gastroesophageal reflux disease)    History of chicken pox    HPV (human papilloma virus) infection    Iron deficiency anemia    Myelofibrosis (HCC)    Ovarian cyst    Thrombocytosis    Vulvar inflammation    Yeast infection    Past Surgical History:  Procedure Laterality Date   BONE MARROW BIOPSY     SHOULDER SURGERY  11/2009   Patient Active Problem List   Diagnosis Date Noted   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   Chronic left shoulder pain 08/22/2013   Acromioclavicular joint separation 08/22/2013    PCP: Katina Pfeiffer, PA-C   REFERRING PROVIDER: Lord Powell CROME, NP  REFERRING DIAG: N94.10 (ICD-10-CM) - Unspecified dyspareunia M79.18 (ICD-10-CM) - Myalgia, other site M62.89 (ICD-10-CM) - Other specified disorders of muscle  THERAPY DIAG:  Other muscle spasm  Muscle weakness (generalized)  Unspecified lack of coordination  Abnormal posture  Rationale for Evaluation and Treatment: Rehabilitation  ONSET DATE: several years  SUBJECTIVE:                                                                                                                                                                                           SUBJECTIVE STATEMENT: Pt reports she did  purchase Ohnut and vibration attachment with this and had no pain with intercourse. However unable to climax. Reports due to strength deficits she isn't able to tolerate most position changes for her to be active.   Fluid intake: water - at least 40oz daily  PAIN:  Are you having pain? No   PRECAUTIONS: Other: cancer treatments  RED FLAGS: None   WEIGHT BEARING RESTRICTIONS: No  FALLS:  Has patient fallen in last 6 months? 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 Candida vaginitis, Fibrocystic breast, HPV,  Myelofibrosis , Vulvar inflammation  Sexual abuse: No  BOWEL MOVEMENT: Pain with bowel movement: No Type of bowel movement:Type (Bristol Stool Scale) 4, Frequency daily, and Strain no Fully empty rectum: Yes:   Leakage: No Pads: No Fiber supplement/laxative magnesium  URINATION: Pain with urination: No Fully empty bladder: Yes: had a residual test and fully empties but reports she feels like there is still urine present Stream: Strong Urgency: No Frequency: urges very often but distraction is helpful, sometimes urinates and within 15 mins has urge to go, most of the time around 2 hours. 1-2x Leakage: none Pads: No  INTERCOURSE:  Ability to have vaginal penetration Yes  Pain with intercourse: Initial Penetration, During Penetration, Deep Penetration, After Intercourse, and Pain Interrupts Intercourse DrynessYes - uses estrodial and Vaseline at vulva  Climax: external vibration only Marinoff Scale: 2/3 Uses a lubricant  PREGNANCY: Vaginal deliveries 2 Tearing Yes: with both Episiotomy No C-section deliveries 0 Currently pregnant  No  PROLAPSE: Pressure   OBJECTIVE:  Note: Objective measures were completed at Evaluation unless otherwise noted.  DIAGNOSTIC FINDINGS:     COGNITION: Overall cognitive status: Within functional limits for tasks assessed     SENSATION: Light touch: Appears intact   FUNCTIONAL TESTS:  Functional squat - decreased descent mildly, mild bil knee valgus    POSTURE: rounded shoulders and forward head   LUMBARAROM/PROM:  A/PROM A/PROM  eval  Flexion WFL  Extension WFL  Right lateral flexion WFL  Left lateral flexion WFL  Right rotation Limited by25%  Left rotation Limited by 25%   (Blank rows = not tested)  LOWER EXTREMITY ROM:  Bil hips grossly limited by 25%  LOWER EXTREMITY MMT:  Bil hips grossly 4/5; knees 4+/5 PALPATION:   General: tightness noted in bil piriformis, bil lumbar paraspinals   Pelvic Alignment: WFL  Abdominal: mild tension in all quadrants but without pain                  External Perineal Exam: no TTP                             Internal Pelvic Floor: TTP in all directions at superficial layer, and bil deep layer  Patient confirms identification and approves PT to assess internal pelvic floor and treatment Yes No emotional/communication barriers or cognitive limitation. Patient is motivated to learn. Patient understands and agrees with treatment goals and plan. PT explains patient will be examined in standing, sitting, and lying down to see how their muscles and joints work. When they are ready, they will be asked to remove their underwear so PT can examine their perineum. The patient is also given the option of providing their own chaperone as one is not provided in our facility. The patient also has the right and is explained the right to defer or refuse any part of the evaluation or treatment including the internal exam. With the patient's consent, PT will use one gloved finger to gently assess the muscles of the pelvic floor, seeing how  well it contracts and relaxes and if there is muscle symmetry. After, the patient will get dressed and PT  and patient will discuss exam findings and plan of care. PT and patient discuss plan of care, schedule, attendance policy and HEP activities.  PELVIC MMT:   MMT eval  Vaginal 2/5; 5s, 4 reps  Internal Anal Sphincter   External Anal Sphincter   Puborectalis   Diastasis Recti   (Blank rows = not tested)        TONE: Slightly increased   PROLAPSE: Not seen in hooklying with cough  TODAY'S TREATMENT:                                                                                                                              DATE:    06/28/23: Manual at abdomen with pt in supine for improved lower abdominal tissue/fascial mobility for decreased urinary frequency and urgency. Pt did have tension at bil sides and midline but worse at Lt lateral side. Released well and pt reports decline in urge to urinate.  Patient consented to internal pelvic floor treatment vaginally this date with focus at introitus/bil bulbocavernosus for improved tolerance and less pain to penetration. Pt had high pain with initially palpation however with cues for diaphragmatic breathing, relaxation techs, and gentle stretching decreased with greatly improved mobility to 1/10. Progressed to bil urethra sides with lt side much tighter than Rt and gentle STM/release work completed here with good effect.   07/12/23: Patient consented to internal pelvic floor treatment vaginally this date with focus at deep pelvic floor today as pt reports no pain with penetration with intercourse recently and no pain with palpation today. Rt and midline deep pelvic floor were pt's painful areas, tension noted at Rt obturator. Pt educated on wand use here. Also TTP and replicated pain per pt, with palpation of lateral sides of cervix and gentle mobility at bil to cervix with good improvement noted and pt reported decreased pain at end of  session Pt educated on possible benefits for Ohnut, or dilators vs wand for improved deep pelvic floor mobility. Pt reports no pain with penetration and only depth this last time and educated on difference in wand and dilators but could attempt use of wand deeper within tolerance for gentle stretch in that area and pt agreed to attempt.   07/20/23: Ball sitting - pelvic circles x10 each, alt marching and shoulder flexion x10 each side, modified bird dogs x10 in standing, x10 pelvic drops with pelvic floor contractions 2x10 seated pelvic floor contraction with diaphragmatic breathing  Pt educated on possible benefits from sex counseling for improvement with communication and needs with intercourse/intimacy - pt open and agreeable to attempt  Pt educated on benefits of strengthening for improved pelvic floor activation during intercourse    PATIENT EDUCATION:  Education details: feminine moisturizers, pelvic relaxation video  Person educated: Patient  Education method: Explanation, Demonstration, Tactile cues, Verbal cues, and Handouts Education comprehension: verbalized understanding, returned demonstration, verbal cues required, tactile cues required, and needs further education  HOME EXERCISE PROGRAM: [id number]  ASSESSMENT:  CLINICAL IMPRESSION: Patient is a 53 y.o. female  who was seen today for physical therapy treatment for pelvic pain, increased urinary frequency, sensation of incomplete urinary emptying, and weakness in core and hips. As pt has been implementing at home internal mobility and stretching vaginally and relaxation techniques has seen improving with pain with intercourse and has started using ohnut. Pt session focused on breathing with strengthening of hips and core for improved mobility for pelvic stability. Pt would benefit from additional PT to further address deficits.    OBJECTIVE IMPAIRMENTS: decreased activity tolerance, decreased coordination, decreased endurance,  decreased mobility, decreased strength, increased fascial restrictions, increased muscle spasms, impaired flexibility, impaired sensation, improper body mechanics, postural dysfunction, and pain.   ACTIVITY LIMITATIONS: continence, locomotion level, and incontinence   PARTICIPATION LIMITATIONS: interpersonal relationship and community activity  PERSONAL FACTORS: Past/current experiences, Time since onset of injury/illness/exacerbation, and 1 comorbidity: medical history are also affecting patient's functional outcome.   REHAB POTENTIAL: Good  CLINICAL DECISION MAKING: Evolving/moderate complexity  EVALUATION COMPLEXITY: Moderate   GOALS: Goals reviewed with patient? Yes  SHORT TERM GOALS: Target date: 06/01/23  Pt to be I with HEP for carry over and continuing recommendations for improved outcomes.   Baseline: Goal status: MET  2.  Pt to report improved time between bladder voids to at least 2 hours for improved QOL with decreased urinary frequency.   Baseline:  Goal status: MET  3.  Pt to be I with relaxation techniques and pelvic drops to decreased tension at pelvic floor and improved tolerance to vaginal penetration.  Baseline:  Goal status: MET  4.  Pt to demonstrate ability to complete pelvic floor contraction/relaxation/bulge for full mobility of pelvic floor and decreased tension.  Baseline:  Goal status: MET   LONG TERM GOALS: Target date: 11/03/23  Pt to be I with  advanced HEP for carry over and continuing recommendations for improved outcomes.   Baseline:  Goal status: INITIAL  2.  Pt to report improved time between bladder voids to at least 2.5 hours for improved QOL with decreased urinary frequency.   Baseline:  Goal status: INITIAL  3.  Pt to demonstrate improved coordination of pelvic floor and breathing mechanics with 10# squat with appropriate synergistic patterns to decrease pain  at least 75% of the time for improved ability to complete a 30 minute  workout with strain at pelvic floor and symptoms.    Baseline:  Goal status: INITIAL  4.  Pt to report no more than 2/10 pain with use of size 6 vaginal dilator or equivalent for improved tolerance to vaginal penetration for intercourse and medical exams.  Baseline:  Goal status: INITIAL  5.  Pt will be independent with the knack, urge suppression technique, and double voiding in order to improve bladder habits and decrease urinary incontinence.   Baseline:  Goal status: INITIAL   PLAN:  PT FREQUENCY: 2x/week   PT DURATION: 10 sessions  PLANNED INTERVENTIONS: 97110-Therapeutic exercises, 97530- Therapeutic activity, 97112- Neuromuscular re-education, 97535- Self Care, [id number]- Manual therapy, [phone]- Canalith repositioning, [id number]- Aquatic Therapy, [phone]- Electrical stimulation (manual), Patient/Family education, Taping, Dry Needling, Joint mobilization, Spinal mobilization, Scar mobilization, DME instructions, Cryotherapy, Moist heat, and Biofeedback  PLAN FOR NEXT SESSION: relaxation techniques, breathing mechanics, manual at back/hips/abdomen, internal pelvic floor as needed  Darryle Navy, PT, DPT 07/19/2508:42 AM

## 2023-07-21 DIAGNOSIS — M25562 Pain in left knee: Secondary | ICD-10-CM | POA: Diagnosis not present

## 2023-07-21 DIAGNOSIS — M25552 Pain in left hip: Secondary | ICD-10-CM | POA: Diagnosis not present

## 2023-07-21 DIAGNOSIS — M79671 Pain in right foot: Secondary | ICD-10-CM | POA: Diagnosis not present

## 2023-07-21 DIAGNOSIS — M25551 Pain in right hip: Secondary | ICD-10-CM | POA: Diagnosis not present

## 2023-07-21 DIAGNOSIS — M79672 Pain in left foot: Secondary | ICD-10-CM | POA: Diagnosis not present

## 2023-07-21 DIAGNOSIS — M25561 Pain in right knee: Secondary | ICD-10-CM | POA: Diagnosis not present

## 2023-07-22 ENCOUNTER — Ambulatory Visit: Admitting: Physical Therapy

## 2023-07-22 DIAGNOSIS — R279 Unspecified lack of coordination: Secondary | ICD-10-CM | POA: Diagnosis not present

## 2023-07-22 DIAGNOSIS — R293 Abnormal posture: Secondary | ICD-10-CM | POA: Diagnosis not present

## 2023-07-22 DIAGNOSIS — M6281 Muscle weakness (generalized): Secondary | ICD-10-CM

## 2023-07-22 DIAGNOSIS — M62838 Other muscle spasm: Secondary | ICD-10-CM | POA: Diagnosis not present

## 2023-07-22 NOTE — Therapy (Signed)
 OUTPATIENT PHYSICAL THERAPY FEMALE PELVIC TREATMENT   Patient Name: Lynn Mccarthy MRN: [id number] DOB:12-Sep-1970, 53 y.o., female Today's Date: 07/22/2023  END OF SESSION:  PT End of Session - 07/22/23 0934     Visit Number 8    Date for PT Re-Evaluation 11/03/23    Authorization Type [employer]    PT Start Time 0930    PT Stop Time 1008    PT Time Calculation (min) 38 min    Activity Tolerance Patient tolerated treatment well;No increased pain    Behavior During Therapy [facility] for tasks assessed/performed            Past Medical History:  Diagnosis Date   Bacterial infection    Basal cell carcinoma of skin 06/2011   Candida vaginitis    Fibrocystic breast    GERD (gastroesophageal reflux disease)    History of chicken pox    HPV (human papilloma virus) infection    Iron deficiency anemia    Myelofibrosis (HCC)    Ovarian cyst    Thrombocytosis    Vulvar inflammation    Yeast infection    Past Surgical History:  Procedure Laterality Date   BONE MARROW BIOPSY     SHOULDER SURGERY  11/2009   Patient Active Problem List   Diagnosis Date Noted   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   Chronic left shoulder pain 08/22/2013   Acromioclavicular joint separation 08/22/2013    PCP: Katina Pfeiffer, PA-C   REFERRING PROVIDER: Lord Powell CROME, NP  REFERRING DIAG: N94.10 (ICD-10-CM) - Unspecified dyspareunia M79.18 (ICD-10-CM) - Myalgia, other site M62.89 (ICD-10-CM) - Other specified disorders of muscle  THERAPY DIAG:  Muscle weakness (generalized)  Abnormal posture  Unspecified lack of coordination  Rationale for Evaluation and Treatment: Rehabilitation  ONSET DATE: several years  SUBJECTIVE:                                                                                                                                                                                           SUBJECTIVE STATEMENT: Pt reports   Fluid intake: water - at  least 40oz daily  PAIN:  Are you having pain? No   PRECAUTIONS: Other: cancer treatments  RED FLAGS: None   WEIGHT BEARING RESTRICTIONS: No  FALLS:  Has patient fallen in last 6 months? 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 Candida vaginitis, Fibrocystic breast, HPV,  Myelofibrosis , Vulvar inflammation  Sexual abuse: No  BOWEL MOVEMENT: Pain with bowel movement: No Type of bowel movement:Type (Bristol Stool Scale) 4, Frequency daily, and Strain no Fully empty rectum: Yes:   Leakage: No Pads: No Fiber supplement/laxative magnesium  URINATION: Pain with urination: No Fully empty bladder: Yes: had a residual test and fully empties but reports she feels like there is still urine present Stream: Strong Urgency: No Frequency: urges very often but distraction is helpful, sometimes urinates and within 15 mins has urge to go, most of the time around 2 hours. 1-2x Leakage: none Pads: No  INTERCOURSE:  Ability to have vaginal penetration Yes  Pain with intercourse: Initial Penetration, During Penetration, Deep Penetration, After Intercourse, and Pain Interrupts Intercourse DrynessYes - uses estrodial and Vaseline at vulva  Climax: external vibration only Marinoff Scale: 2/3 Uses a lubricant  PREGNANCY: Vaginal deliveries 2 Tearing Yes: with both Episiotomy No C-section deliveries 0 Currently pregnant No  PROLAPSE: Pressure   OBJECTIVE:  Note: Objective measures were completed at Evaluation unless otherwise noted.  DIAGNOSTIC FINDINGS:     COGNITION: Overall cognitive status: Within functional limits for tasks  assessed     SENSATION: Light touch: Appears intact   FUNCTIONAL TESTS:  Functional squat - decreased descent mildly, mild bil knee valgus    POSTURE: rounded shoulders and forward head   LUMBARAROM/PROM:  A/PROM A/PROM  eval  Flexion WFL  Extension WFL  Right lateral flexion WFL  Left lateral flexion WFL  Right rotation Limited by25%  Left rotation Limited by 25%   (Blank rows = not tested)  LOWER EXTREMITY ROM:  Bil hips grossly limited by 25%  LOWER EXTREMITY MMT:  Bil hips grossly 4/5; knees 4+/5 PALPATION:   General: tightness noted in bil piriformis, bil lumbar paraspinals   Pelvic Alignment: WFL  Abdominal: mild tension in all quadrants but without pain                  External Perineal Exam: no TTP                             Internal Pelvic Floor: TTP in all directions at superficial layer, and bil deep layer  Patient confirms identification and approves PT to assess internal pelvic floor and treatment Yes No emotional/communication barriers or cognitive limitation. Patient is motivated to learn. Patient understands and agrees with treatment goals and plan. PT explains patient will be examined in standing, sitting, and lying down to see how their muscles and joints work. When they are ready, they will be asked to remove their underwear so PT can examine their perineum. The patient is also given the option of providing their own chaperone as one is not provided in our facility. The patient also has the right and is explained the right to defer or refuse any part of the evaluation or treatment including the internal exam. With the patient's consent, PT will use one gloved finger to gently assess the muscles of the pelvic floor, seeing how well it contracts and relaxes and if there is muscle symmetry. After, the patient will get dressed and PT and patient will discuss exam findings and plan of care. PT and patient discuss plan of care, schedule, attendance policy  and HEP activities.  PELVIC MMT:   MMT eval Discharge 7/10  Vaginal 2/5; 5s, 4 reps 3/5, 8s, 6  reps  Internal Anal Sphincter    External Anal Sphincter    Puborectalis    Diastasis Recti    (Blank rows = not tested)        TONE: Slightly increased   PROLAPSE: Not seen in hooklying with cough  TODAY'S TREATMENT:                                                                                                                              DATE:   07/12/23: Patient consented to internal pelvic floor treatment vaginally this date with focus at deep pelvic floor today as pt reports no pain with penetration with intercourse recently and no pain with palpation today. Rt and midline deep pelvic floor were pt's painful areas, tension noted at Rt obturator. Pt educated on wand use here. Also TTP and replicated pain per pt, with palpation of lateral sides of cervix and gentle mobility at bil to cervix with good improvement noted and pt reported decreased pain at end of session Pt educated on possible benefits for [place], or dilators vs wand for improved deep pelvic floor mobility. Pt reports no pain with penetration and only depth this last time and educated on difference in wand and dilators but could attempt use of wand deeper within tolerance for gentle stretch in that area and pt agreed to attempt.   07/20/23: Ball sitting - pelvic circles x10 each, alt marching and shoulder flexion x10 each side, modified bird dogs x10 in standing, x10 pelvic drops with pelvic floor contractions 2x10 seated pelvic floor contraction with diaphragmatic breathing  Pt educated on possible benefits from sex counseling for improvement with communication and needs with intercourse/intimacy - pt open and agreeable to attempt  Pt educated on benefits of strengthening for improved pelvic floor activation during intercourse  07/22/23: Patient consented to internal pelvic floor treatment vaginally this date and found to have  improved strength, endurance, and coordination overall. Did have slight tension in superficial layer in downward direction, and Rt obturator but released well with gentle release and Rt hip rotation.  Pt educated on continued relaxation techniques, reports she feels confident progressing strengthening of core and hips now and still getting PT for back and hip outside this clinic to assist with this too.      PATIENT EDUCATION:  Education details: feminine moisturizers, pelvic relaxation video  Person educated: Patient  Education method: Explanation, Demonstration, Tactile cues, Verbal cues, and Handouts Education comprehension: verbalized understanding, returned demonstration, verbal cues required, tactile cues required, and needs further education  HOME EXERCISE PROGRAM: [id number]  ASSESSMENT:  CLINICAL IMPRESSION: Patient is a 53 y.o. female  who was seen today for physical therapy treatment for pelvic pain, increased urinary frequency, sensation of incomplete urinary emptying, and weakness in core and hips. Pt tolerated session well, denied questions or concerns and very pleased with progress. All goals met and pt agreeable to DC today. Understands she would need new referral for future PT  here.   OBJECTIVE IMPAIRMENTS: decreased activity tolerance, decreased coordination, decreased endurance, decreased mobility, decreased strength, increased fascial restrictions, increased muscle spasms, impaired flexibility, impaired sensation, improper body mechanics, postural dysfunction, and pain.   ACTIVITY LIMITATIONS: continence, locomotion level, and incontinence   PARTICIPATION LIMITATIONS: interpersonal relationship and community activity  PERSONAL FACTORS: Past/current experiences, Time since onset of injury/illness/exacerbation, and 1 comorbidity: medical history are also affecting patient's functional outcome.   REHAB POTENTIAL: Good  CLINICAL DECISION MAKING: Evolving/moderate  complexity  EVALUATION COMPLEXITY: Moderate   GOALS: Goals reviewed with patient? Yes  SHORT TERM GOALS: Target date: 06/01/23  Pt to be I with HEP for carry over and continuing recommendations for improved outcomes.   Baseline: Goal status: MET  2.  Pt to report improved time between bladder voids to at least 2 hours for improved QOL with decreased urinary frequency.   Baseline:  Goal status: MET  3.  Pt to be I with relaxation techniques and pelvic drops to decreased tension at pelvic floor and improved tolerance to vaginal penetration.  Baseline:  Goal status: MET  4.  Pt to demonstrate ability to complete pelvic floor contraction/relaxation/bulge for full mobility of pelvic floor and decreased tension.  Baseline:  Goal status: MET   LONG TERM GOALS: Target date: 11/03/23  Pt to be I with  advanced HEP for carry over and continuing recommendations for improved outcomes.   Baseline:  Goal status: MET  2.  Pt to report improved time between bladder voids to at least 2.5 hours for improved QOL with decreased urinary frequency.   Baseline:  Goal status: MET  3.  Pt to demonstrate improved coordination of pelvic floor and breathing mechanics with 10# squat with appropriate synergistic patterns to decrease pain  at least 75% of the time for improved ability to complete a 30 minute workout with strain at pelvic floor and symptoms.    Baseline:  Goal status: pt reports MET outside of clinic  4.  Pt to report no more than 2/10 pain with use of size 6 vaginal dilator or equivalent for improved tolerance to vaginal penetration for intercourse and medical exams.  Baseline:  Goal status: MET  5.  Pt will be independent with the knack, urge suppression technique, and double voiding in order to improve bladder habits and decrease urinary incontinence.   Baseline:  Goal status: MET   PLAN:  PT FREQUENCY: 2x/week   PT DURATION: 10 sessions  PLANNED INTERVENTIONS:  97110-Therapeutic exercises, 97530- Therapeutic activity, 97112- Neuromuscular re-education, 97535- Self Care, [id number]- Manual therapy, [phone]- Canalith repositioning, [id number]- Aquatic Therapy, [phone]- Electrical stimulation (manual), Patient/Family education, Taping, Dry Needling, Joint mobilization, Spinal mobilization, Scar mobilization, DME instructions, Cryotherapy, Moist heat, and Biofeedback  PLAN FOR NEXT SESSION:   PHYSICAL THERAPY DISCHARGE SUMMARY  Visits from Start of Care: 8  Current functional level related to goals / functional outcomes: All goals met   Remaining deficits: Mild tension internal pelvic floor but pt I with continued mobility and relaxation techniques    Education / Equipment: HEP   Patient agrees to discharge. Patient goals were met. Patient is being discharged due to meeting the stated rehab goals.   Darryle Navy, PT, DPT 07/21/2508:09 AM

## 2023-07-23 DIAGNOSIS — D474 Osteomyelofibrosis: Principal | ICD-10-CM

## 2023-07-23 DIAGNOSIS — D473 Essential (hemorrhagic) thrombocythemia: Principal | ICD-10-CM

## 2023-07-24 MED ORDER — OLANZAPINE 5 MG TABLET
ORAL_TABLET | Freq: Every evening | ORAL | 2 refills | 0.00000 days
Start: 2023-07-24 — End: ?

## 2023-07-26 DIAGNOSIS — D473 Essential (hemorrhagic) thrombocythemia: Principal | ICD-10-CM

## 2023-07-26 DIAGNOSIS — Z9484 Stem cells transplant status: Principal | ICD-10-CM

## 2023-07-26 DIAGNOSIS — D474 Osteomyelofibrosis: Principal | ICD-10-CM

## 2023-07-26 MED ORDER — TACROLIMUS 0.5 MG CAPSULE, IMMEDIATE-RELEASE
ORAL_CAPSULE | 1 refills | 0.00000 days
Start: 2023-07-26 — End: ?

## 2023-07-26 MED ORDER — OLANZAPINE 5 MG TABLET
ORAL_TABLET | Freq: Every evening | ORAL | 2 refills | 30.00000 days | Status: CP
Start: 2023-07-26 — End: ?

## 2023-07-27 ENCOUNTER — Encounter: Admitting: Physical Therapy

## 2023-07-27 MED ORDER — TACROLIMUS 0.5 MG CAPSULE, IMMEDIATE-RELEASE
ORAL_CAPSULE | ORAL | 1 refills | 0.00000 days | Status: CP
Start: 2023-07-27 — End: ?

## 2023-07-28 ENCOUNTER — Ambulatory Visit: Admit: 2023-07-28 | Discharge: 2023-07-29 | Payer: PRIVATE HEALTH INSURANCE

## 2023-07-28 ENCOUNTER — Other Ambulatory Visit: Admit: 2023-07-28 | Discharge: 2023-07-29 | Payer: PRIVATE HEALTH INSURANCE

## 2023-07-28 ENCOUNTER — Ambulatory Visit
Admit: 2023-07-28 | Discharge: 2023-07-29 | Payer: PRIVATE HEALTH INSURANCE | Attending: Nurse Practitioner | Primary: Nurse Practitioner

## 2023-07-28 DIAGNOSIS — Z9484 Stem cells transplant status: Principal | ICD-10-CM

## 2023-07-28 DIAGNOSIS — D474 Osteomyelofibrosis: Principal | ICD-10-CM

## 2023-07-28 DIAGNOSIS — D473 Essential (hemorrhagic) thrombocythemia: Principal | ICD-10-CM

## 2023-07-28 DIAGNOSIS — D89813 Graft-versus-host disease, unspecified: Principal | ICD-10-CM

## 2023-07-28 DIAGNOSIS — D7581 Myelofibrosis: Secondary | ICD-10-CM | POA: Diagnosis not present

## 2023-07-28 DIAGNOSIS — R059 Cough, unspecified: Secondary | ICD-10-CM | POA: Diagnosis not present

## 2023-07-28 DIAGNOSIS — M25641 Stiffness of right hand, not elsewhere classified: Secondary | ICD-10-CM | POA: Diagnosis not present

## 2023-07-28 DIAGNOSIS — M1610 Unilateral primary osteoarthritis, unspecified hip: Secondary | ICD-10-CM | POA: Diagnosis not present

## 2023-07-28 DIAGNOSIS — M25642 Stiffness of left hand, not elsewhere classified: Secondary | ICD-10-CM | POA: Diagnosis not present

## 2023-07-28 DIAGNOSIS — Z20822 Contact with and (suspected) exposure to covid-19: Secondary | ICD-10-CM | POA: Diagnosis not present

## 2023-07-28 DIAGNOSIS — R200 Anesthesia of skin: Secondary | ICD-10-CM | POA: Diagnosis not present

## 2023-07-28 DIAGNOSIS — R0989 Other specified symptoms and signs involving the circulatory and respiratory systems: Secondary | ICD-10-CM | POA: Diagnosis not present

## 2023-07-29 ENCOUNTER — Encounter: Admitting: Physical Therapy

## 2023-07-29 DIAGNOSIS — D474 Osteomyelofibrosis: Principal | ICD-10-CM

## 2023-07-29 DIAGNOSIS — D473 Essential (hemorrhagic) thrombocythemia: Principal | ICD-10-CM

## 2023-07-29 MED ORDER — RUXOLITINIB 5 MG TABLET
ORAL_TABLET | ORAL | 1 refills | 30.00000 days | Status: CP
Start: 2023-07-29 — End: 2023-09-27

## 2023-07-30 DIAGNOSIS — D473 Essential (hemorrhagic) thrombocythemia: Principal | ICD-10-CM

## 2023-07-30 DIAGNOSIS — D474 Osteomyelofibrosis: Principal | ICD-10-CM

## 2023-07-30 DIAGNOSIS — B340 Adenovirus infection, unspecified: Principal | ICD-10-CM

## 2023-08-02 ENCOUNTER — Other Ambulatory Visit: Admit: 2023-08-02 | Discharge: 2023-08-03 | Payer: PRIVATE HEALTH INSURANCE

## 2023-08-02 DIAGNOSIS — D474 Osteomyelofibrosis: Principal | ICD-10-CM

## 2023-08-02 DIAGNOSIS — D473 Essential (hemorrhagic) thrombocythemia: Principal | ICD-10-CM

## 2023-08-02 DIAGNOSIS — B340 Adenovirus infection, unspecified: Principal | ICD-10-CM

## 2023-08-03 DIAGNOSIS — D474 Osteomyelofibrosis: Principal | ICD-10-CM

## 2023-08-03 DIAGNOSIS — D473 Essential (hemorrhagic) thrombocythemia: Principal | ICD-10-CM

## 2023-08-03 DIAGNOSIS — D801 Nonfamilial hypogammaglobulinemia: Principal | ICD-10-CM

## 2023-08-03 DIAGNOSIS — B340 Adenovirus infection, unspecified: Principal | ICD-10-CM

## 2023-08-04 DIAGNOSIS — D473 Essential (hemorrhagic) thrombocythemia: Principal | ICD-10-CM

## 2023-08-04 DIAGNOSIS — D474 Osteomyelofibrosis: Principal | ICD-10-CM

## 2023-08-05 DIAGNOSIS — M25561 Pain in right knee: Secondary | ICD-10-CM | POA: Diagnosis not present

## 2023-08-05 DIAGNOSIS — M25552 Pain in left hip: Secondary | ICD-10-CM | POA: Diagnosis not present

## 2023-08-05 DIAGNOSIS — M79672 Pain in left foot: Secondary | ICD-10-CM | POA: Diagnosis not present

## 2023-08-05 DIAGNOSIS — M25551 Pain in right hip: Secondary | ICD-10-CM | POA: Diagnosis not present

## 2023-08-05 DIAGNOSIS — M25562 Pain in left knee: Secondary | ICD-10-CM | POA: Diagnosis not present

## 2023-08-05 DIAGNOSIS — M79671 Pain in right foot: Secondary | ICD-10-CM | POA: Diagnosis not present

## 2023-08-05 DIAGNOSIS — F4321 Adjustment disorder with depressed mood: Secondary | ICD-10-CM | POA: Diagnosis not present

## 2023-08-07 DIAGNOSIS — Z9484 Stem cells transplant status: Principal | ICD-10-CM

## 2023-08-07 DIAGNOSIS — D473 Essential (hemorrhagic) thrombocythemia: Principal | ICD-10-CM

## 2023-08-07 DIAGNOSIS — D474 Osteomyelofibrosis: Principal | ICD-10-CM

## 2023-08-07 MED ORDER — MG-PLUS-PROTEIN 133 MG TABLET
ORAL_TABLET | ORAL | 0 refills | 50.00000 days
Start: 2023-08-07 — End: ?

## 2023-08-09 DIAGNOSIS — D473 Essential (hemorrhagic) thrombocythemia: Principal | ICD-10-CM

## 2023-08-09 DIAGNOSIS — D474 Osteomyelofibrosis: Principal | ICD-10-CM

## 2023-08-09 MED ORDER — MG-PLUS-PROTEIN 133 MG TABLET
ORAL_TABLET | ORAL | 0 refills | 50.00000 days | Status: CP
Start: 2023-08-09 — End: ?
  Filled 2023-08-11: qty 200, 50d supply, fill #0

## 2023-08-10 DIAGNOSIS — D473 Essential (hemorrhagic) thrombocythemia: Principal | ICD-10-CM

## 2023-08-10 DIAGNOSIS — D474 Osteomyelofibrosis: Principal | ICD-10-CM

## 2023-08-10 DIAGNOSIS — Z9484 Stem cells transplant status: Principal | ICD-10-CM

## 2023-08-10 DIAGNOSIS — D84822 Immunocompromised state associated with stem cell transplant: Principal | ICD-10-CM

## 2023-08-10 DIAGNOSIS — D801 Nonfamilial hypogammaglobulinemia: Principal | ICD-10-CM

## 2023-08-10 DIAGNOSIS — F4321 Adjustment disorder with depressed mood: Secondary | ICD-10-CM | POA: Diagnosis not present

## 2023-08-11 ENCOUNTER — Other Ambulatory Visit: Admit: 2023-08-11 | Discharge: 2023-08-11 | Payer: PRIVATE HEALTH INSURANCE

## 2023-08-11 ENCOUNTER — Ambulatory Visit: Admit: 2023-08-11 | Discharge: 2023-08-11 | Payer: PRIVATE HEALTH INSURANCE

## 2023-08-11 ENCOUNTER — Inpatient Hospital Stay: Admit: 2023-08-11 | Discharge: 2023-08-11 | Payer: PRIVATE HEALTH INSURANCE

## 2023-08-11 DIAGNOSIS — D474 Osteomyelofibrosis: Principal | ICD-10-CM

## 2023-08-11 DIAGNOSIS — D473 Essential (hemorrhagic) thrombocythemia: Principal | ICD-10-CM

## 2023-08-11 DIAGNOSIS — B340 Adenovirus infection, unspecified: Principal | ICD-10-CM

## 2023-08-11 DIAGNOSIS — D84822 Immunocompromised state associated with stem cell transplant: Principal | ICD-10-CM

## 2023-08-11 DIAGNOSIS — D89813 Graft-versus-host disease, unspecified: Principal | ICD-10-CM

## 2023-08-11 DIAGNOSIS — D801 Nonfamilial hypogammaglobulinemia: Principal | ICD-10-CM

## 2023-08-11 DIAGNOSIS — Z9484 Stem cells transplant status: Principal | ICD-10-CM

## 2023-08-11 DIAGNOSIS — D7581 Myelofibrosis: Secondary | ICD-10-CM | POA: Diagnosis not present

## 2023-08-11 DIAGNOSIS — D61818 Other pancytopenia: Secondary | ICD-10-CM | POA: Diagnosis not present

## 2023-08-11 DIAGNOSIS — M2550 Pain in unspecified joint: Secondary | ICD-10-CM | POA: Diagnosis not present

## 2023-08-11 DIAGNOSIS — R3915 Urgency of urination: Secondary | ICD-10-CM | POA: Diagnosis not present

## 2023-08-11 DIAGNOSIS — R197 Diarrhea, unspecified: Secondary | ICD-10-CM | POA: Diagnosis not present

## 2023-08-11 DIAGNOSIS — D7589 Other specified diseases of blood and blood-forming organs: Secondary | ICD-10-CM | POA: Diagnosis not present

## 2023-08-11 DIAGNOSIS — Z5181 Encounter for therapeutic drug level monitoring: Secondary | ICD-10-CM | POA: Diagnosis not present

## 2023-08-11 MED ORDER — RUXOLITINIB 5 MG TABLET
ORAL_TABLET | Freq: Two times a day (BID) | ORAL | 5 refills | 30.00000 days | Status: CP
Start: 2023-08-11 — End: 2024-02-07

## 2023-08-11 MED ORDER — RUXOLITINIB 10 MG TABLET
ORAL_TABLET | Freq: Two times a day (BID) | ORAL | 2 refills | 30.00000 days | Status: CP
Start: 2023-08-11 — End: 2023-08-11

## 2023-08-11 MED FILL — CALCIUM 600 MG (AS CARBONATE)-VITAMIN D3 20 MCG (800 UNIT) TABLET: ORAL | 30 days supply | Qty: 60 | Fill #3

## 2023-08-12 DIAGNOSIS — D474 Osteomyelofibrosis: Principal | ICD-10-CM

## 2023-08-12 DIAGNOSIS — D473 Essential (hemorrhagic) thrombocythemia: Principal | ICD-10-CM

## 2023-08-12 DIAGNOSIS — M25552 Pain in left hip: Secondary | ICD-10-CM | POA: Diagnosis not present

## 2023-08-12 DIAGNOSIS — M79672 Pain in left foot: Secondary | ICD-10-CM | POA: Diagnosis not present

## 2023-08-12 DIAGNOSIS — M79671 Pain in right foot: Secondary | ICD-10-CM | POA: Diagnosis not present

## 2023-08-12 DIAGNOSIS — M25562 Pain in left knee: Secondary | ICD-10-CM | POA: Diagnosis not present

## 2023-08-12 DIAGNOSIS — M25561 Pain in right knee: Secondary | ICD-10-CM | POA: Diagnosis not present

## 2023-08-12 DIAGNOSIS — M25551 Pain in right hip: Secondary | ICD-10-CM | POA: Diagnosis not present

## 2023-08-17 DIAGNOSIS — D474 Osteomyelofibrosis: Principal | ICD-10-CM

## 2023-08-17 DIAGNOSIS — D473 Essential (hemorrhagic) thrombocythemia: Principal | ICD-10-CM

## 2023-08-17 DIAGNOSIS — F4321 Adjustment disorder with depressed mood: Secondary | ICD-10-CM | POA: Diagnosis not present

## 2023-08-18 DIAGNOSIS — D474 Osteomyelofibrosis: Principal | ICD-10-CM

## 2023-08-18 DIAGNOSIS — D473 Essential (hemorrhagic) thrombocythemia: Principal | ICD-10-CM

## 2023-08-18 DIAGNOSIS — M79671 Pain in right foot: Secondary | ICD-10-CM | POA: Diagnosis not present

## 2023-08-18 DIAGNOSIS — M25562 Pain in left knee: Secondary | ICD-10-CM | POA: Diagnosis not present

## 2023-08-18 DIAGNOSIS — M25551 Pain in right hip: Secondary | ICD-10-CM | POA: Diagnosis not present

## 2023-08-18 DIAGNOSIS — M25552 Pain in left hip: Secondary | ICD-10-CM | POA: Diagnosis not present

## 2023-08-18 DIAGNOSIS — M25561 Pain in right knee: Secondary | ICD-10-CM | POA: Diagnosis not present

## 2023-08-18 DIAGNOSIS — M79672 Pain in left foot: Secondary | ICD-10-CM | POA: Diagnosis not present

## 2023-08-19 MED ORDER — PANTOPRAZOLE 20 MG TABLET,DELAYED RELEASE
ORAL_TABLET | Freq: Every day | ORAL | 1 refills | 30.00000 days | Status: CP
Start: 2023-08-19 — End: ?

## 2023-08-20 DIAGNOSIS — D474 Osteomyelofibrosis: Principal | ICD-10-CM

## 2023-08-20 DIAGNOSIS — D473 Essential (hemorrhagic) thrombocythemia: Principal | ICD-10-CM

## 2023-08-25 DIAGNOSIS — D474 Osteomyelofibrosis: Principal | ICD-10-CM

## 2023-08-25 DIAGNOSIS — D473 Essential (hemorrhagic) thrombocythemia: Principal | ICD-10-CM

## 2023-08-30 DIAGNOSIS — M79672 Pain in left foot: Secondary | ICD-10-CM | POA: Diagnosis not present

## 2023-08-30 DIAGNOSIS — M25552 Pain in left hip: Secondary | ICD-10-CM | POA: Diagnosis not present

## 2023-08-30 DIAGNOSIS — M25551 Pain in right hip: Secondary | ICD-10-CM | POA: Diagnosis not present

## 2023-08-30 DIAGNOSIS — M25562 Pain in left knee: Secondary | ICD-10-CM | POA: Diagnosis not present

## 2023-08-30 DIAGNOSIS — M79671 Pain in right foot: Secondary | ICD-10-CM | POA: Diagnosis not present

## 2023-08-30 DIAGNOSIS — M25561 Pain in right knee: Secondary | ICD-10-CM | POA: Diagnosis not present

## 2023-09-01 DIAGNOSIS — F4321 Adjustment disorder with depressed mood: Secondary | ICD-10-CM | POA: Diagnosis not present

## 2023-09-07 DIAGNOSIS — D473 Essential (hemorrhagic) thrombocythemia: Principal | ICD-10-CM

## 2023-09-07 DIAGNOSIS — B340 Adenovirus infection, unspecified: Principal | ICD-10-CM

## 2023-09-07 DIAGNOSIS — D474 Osteomyelofibrosis: Principal | ICD-10-CM

## 2023-09-08 ENCOUNTER — Ambulatory Visit
Admit: 2023-09-08 | Discharge: 2023-09-08 | Payer: PRIVATE HEALTH INSURANCE | Attending: Nurse Practitioner | Primary: Nurse Practitioner

## 2023-09-08 ENCOUNTER — Inpatient Hospital Stay: Admit: 2023-09-08 | Discharge: 2023-09-08 | Payer: PRIVATE HEALTH INSURANCE

## 2023-09-08 ENCOUNTER — Other Ambulatory Visit: Admit: 2023-09-08 | Discharge: 2023-09-08 | Payer: PRIVATE HEALTH INSURANCE

## 2023-09-08 DIAGNOSIS — D474 Osteomyelofibrosis: Principal | ICD-10-CM

## 2023-09-08 DIAGNOSIS — Z9484 Stem cells transplant status: Principal | ICD-10-CM

## 2023-09-08 DIAGNOSIS — D473 Essential (hemorrhagic) thrombocythemia: Principal | ICD-10-CM

## 2023-09-08 DIAGNOSIS — B340 Adenovirus infection, unspecified: Principal | ICD-10-CM

## 2023-09-08 DIAGNOSIS — M1990 Unspecified osteoarthritis, unspecified site: Principal | ICD-10-CM

## 2023-09-08 DIAGNOSIS — D89813 Graft-versus-host disease, unspecified: Principal | ICD-10-CM

## 2023-09-08 DIAGNOSIS — R109 Unspecified abdominal pain: Secondary | ICD-10-CM | POA: Diagnosis not present

## 2023-09-08 DIAGNOSIS — M47816 Spondylosis without myelopathy or radiculopathy, lumbar region: Secondary | ICD-10-CM | POA: Diagnosis not present

## 2023-09-08 MED ORDER — TRAMADOL 50 MG TABLET
ORAL_TABLET | Freq: Three times a day (TID) | ORAL | 0 refills | 10.00000 days | Status: CP | PRN
Start: 2023-09-08 — End: ?
  Filled 2023-09-08: qty 30, 10d supply, fill #0

## 2023-09-08 MED ORDER — MG-PLUS-PROTEIN 133 MG TABLET
ORAL_TABLET | ORAL | 0 refills | 50.00000 days | Status: CP
Start: 2023-09-08 — End: 2023-09-08
  Filled 2023-09-08: qty 200, 30d supply, fill #0

## 2023-09-08 MED ORDER — CALCIUM 600 MG (AS CARBONATE)-VITAMIN D3 20 MCG (800 UNIT) TABLET
ORAL_TABLET | Freq: Two times a day (BID) | ORAL | 3 refills | 30.00000 days | Status: CP
Start: 2023-09-08 — End: 2023-10-08

## 2023-09-09 DIAGNOSIS — D474 Osteomyelofibrosis: Principal | ICD-10-CM

## 2023-09-09 DIAGNOSIS — D473 Essential (hemorrhagic) thrombocythemia: Principal | ICD-10-CM

## 2023-09-10 DIAGNOSIS — D473 Essential (hemorrhagic) thrombocythemia: Principal | ICD-10-CM

## 2023-09-10 DIAGNOSIS — D474 Osteomyelofibrosis: Principal | ICD-10-CM

## 2023-09-10 DIAGNOSIS — Z9484 Stem cells transplant status: Principal | ICD-10-CM

## 2023-09-10 MED ORDER — CALCIUM 600 MG (AS CARBONATE)-VITAMIN D3 20 MCG (800 UNIT) TABLET
ORAL_TABLET | Freq: Two times a day (BID) | ORAL | 3 refills | 30.00000 days | Status: CP
Start: 2023-09-10 — End: 2023-10-10

## 2023-09-11 DIAGNOSIS — D89813 Graft-versus-host disease, unspecified: Principal | ICD-10-CM

## 2023-09-11 MED ORDER — MONTELUKAST 10 MG TABLET
ORAL_TABLET | Freq: Every evening | ORAL | 2 refills | 0.00000 days
Start: 2023-09-11 — End: ?

## 2023-09-13 MED ORDER — MONTELUKAST 10 MG TABLET
ORAL_TABLET | Freq: Every evening | ORAL | 2 refills | 30.00000 days | Status: CP
Start: 2023-09-13 — End: ?

## 2023-09-13 MED ORDER — PANTOPRAZOLE 20 MG TABLET,DELAYED RELEASE
ORAL_TABLET | Freq: Every day | ORAL | 1 refills | 90.00000 days | Status: CP
Start: 2023-09-13 — End: ?

## 2023-09-16 DIAGNOSIS — F4321 Adjustment disorder with depressed mood: Secondary | ICD-10-CM | POA: Diagnosis not present

## 2023-09-20 DIAGNOSIS — Z9484 Stem cells transplant status: Principal | ICD-10-CM

## 2023-09-20 DIAGNOSIS — D474 Osteomyelofibrosis: Principal | ICD-10-CM

## 2023-09-20 DIAGNOSIS — D473 Essential (hemorrhagic) thrombocythemia: Principal | ICD-10-CM

## 2023-09-20 MED ORDER — TACROLIMUS 0.5 MG CAPSULE, IMMEDIATE-RELEASE
ORAL_CAPSULE | 1 refills | 0.00000 days
Start: 2023-09-20 — End: ?

## 2023-09-21 MED ORDER — TACROLIMUS 0.5 MG CAPSULE, IMMEDIATE-RELEASE
ORAL_CAPSULE | ORAL | 1 refills | 0.00000 days | Status: CP
Start: 2023-09-21 — End: ?

## 2023-09-22 ENCOUNTER — Other Ambulatory Visit: Admit: 2023-09-22 | Discharge: 2023-09-23 | Payer: PRIVATE HEALTH INSURANCE

## 2023-09-22 ENCOUNTER — Ambulatory Visit: Admit: 2023-09-22 | Discharge: 2023-09-23 | Payer: PRIVATE HEALTH INSURANCE

## 2023-09-22 DIAGNOSIS — D473 Essential (hemorrhagic) thrombocythemia: Principal | ICD-10-CM

## 2023-09-22 DIAGNOSIS — Z9484 Stem cells transplant status: Principal | ICD-10-CM

## 2023-09-22 DIAGNOSIS — D474 Osteomyelofibrosis: Principal | ICD-10-CM

## 2023-09-22 DIAGNOSIS — D89813 Graft-versus-host disease, unspecified: Principal | ICD-10-CM

## 2023-09-22 DIAGNOSIS — D84822 Immunodeficiency due to external causes: Secondary | ICD-10-CM | POA: Diagnosis not present

## 2023-09-22 DIAGNOSIS — D7581 Myelofibrosis: Secondary | ICD-10-CM | POA: Diagnosis not present

## 2023-09-23 DIAGNOSIS — D473 Essential (hemorrhagic) thrombocythemia: Principal | ICD-10-CM

## 2023-09-23 DIAGNOSIS — Z9484 Stem cells transplant status: Principal | ICD-10-CM

## 2023-09-23 DIAGNOSIS — D474 Osteomyelofibrosis: Principal | ICD-10-CM

## 2023-09-27 DIAGNOSIS — D474 Osteomyelofibrosis: Principal | ICD-10-CM

## 2023-09-27 DIAGNOSIS — D473 Essential (hemorrhagic) thrombocythemia: Principal | ICD-10-CM

## 2023-10-06 ENCOUNTER — Other Ambulatory Visit: Admit: 2023-10-06 | Discharge: 2023-10-06 | Payer: PRIVATE HEALTH INSURANCE

## 2023-10-06 ENCOUNTER — Ambulatory Visit
Admit: 2023-10-06 | Discharge: 2023-10-06 | Payer: PRIVATE HEALTH INSURANCE | Attending: Critical Care Medicine | Primary: Critical Care Medicine

## 2023-10-06 ENCOUNTER — Inpatient Hospital Stay: Admit: 2023-10-06 | Discharge: 2023-10-06 | Payer: PRIVATE HEALTH INSURANCE

## 2023-10-06 DIAGNOSIS — D473 Essential (hemorrhagic) thrombocythemia: Principal | ICD-10-CM

## 2023-10-06 DIAGNOSIS — Z9484 Stem cells transplant status: Principal | ICD-10-CM

## 2023-10-06 DIAGNOSIS — D89813 Graft-versus-host disease, unspecified: Principal | ICD-10-CM

## 2023-10-06 DIAGNOSIS — D474 Osteomyelofibrosis: Principal | ICD-10-CM

## 2023-10-06 DIAGNOSIS — M1990 Unspecified osteoarthritis, unspecified site: Principal | ICD-10-CM

## 2023-10-06 DIAGNOSIS — Z23 Encounter for immunization: Principal | ICD-10-CM

## 2023-10-06 DIAGNOSIS — M7918 Myalgia, other site: Secondary | ICD-10-CM | POA: Diagnosis not present

## 2023-10-06 DIAGNOSIS — N958 Other specified menopausal and perimenopausal disorders: Secondary | ICD-10-CM | POA: Diagnosis not present

## 2023-10-06 MED ORDER — TRAMADOL 50 MG TABLET
ORAL_TABLET | Freq: Three times a day (TID) | ORAL | 0 refills | 10.00000 days | Status: CP | PRN
Start: 2023-10-06 — End: ?

## 2023-10-07 DIAGNOSIS — D474 Osteomyelofibrosis: Principal | ICD-10-CM

## 2023-10-07 DIAGNOSIS — D473 Essential (hemorrhagic) thrombocythemia: Principal | ICD-10-CM

## 2023-10-07 DIAGNOSIS — Z124 Encounter for screening for malignant neoplasm of cervix: Secondary | ICD-10-CM | POA: Diagnosis not present

## 2023-10-07 DIAGNOSIS — Z01419 Encounter for gynecological examination (general) (routine) without abnormal findings: Secondary | ICD-10-CM | POA: Diagnosis not present

## 2023-10-07 DIAGNOSIS — N898 Other specified noninflammatory disorders of vagina: Secondary | ICD-10-CM | POA: Diagnosis not present

## 2023-10-07 DIAGNOSIS — Z1211 Encounter for screening for malignant neoplasm of colon: Secondary | ICD-10-CM | POA: Diagnosis not present

## 2023-10-07 DIAGNOSIS — Z1330 Encounter for screening examination for mental health and behavioral disorders, unspecified: Secondary | ICD-10-CM | POA: Diagnosis not present

## 2023-10-07 DIAGNOSIS — Z1231 Encounter for screening mammogram for malignant neoplasm of breast: Secondary | ICD-10-CM | POA: Diagnosis not present

## 2023-10-10 DIAGNOSIS — D473 Essential (hemorrhagic) thrombocythemia: Principal | ICD-10-CM

## 2023-10-10 DIAGNOSIS — D474 Osteomyelofibrosis: Principal | ICD-10-CM

## 2023-10-13 DIAGNOSIS — D474 Osteomyelofibrosis: Principal | ICD-10-CM

## 2023-10-13 DIAGNOSIS — D473 Essential (hemorrhagic) thrombocythemia: Principal | ICD-10-CM

## 2023-10-15 DIAGNOSIS — M1990 Unspecified osteoarthritis, unspecified site: Principal | ICD-10-CM

## 2023-10-15 DIAGNOSIS — D473 Essential (hemorrhagic) thrombocythemia: Principal | ICD-10-CM

## 2023-10-15 DIAGNOSIS — D474 Osteomyelofibrosis: Principal | ICD-10-CM

## 2023-10-15 MED ORDER — TRAMADOL 50 MG TABLET
ORAL_TABLET | Freq: Three times a day (TID) | ORAL | 0 refills | 10.00000 days | Status: CP | PRN
Start: 2023-10-15 — End: ?

## 2023-10-19 DIAGNOSIS — M25551 Pain in right hip: Secondary | ICD-10-CM | POA: Diagnosis not present

## 2023-10-19 DIAGNOSIS — M25562 Pain in left knee: Secondary | ICD-10-CM | POA: Diagnosis not present

## 2023-10-19 DIAGNOSIS — M25552 Pain in left hip: Secondary | ICD-10-CM | POA: Diagnosis not present

## 2023-10-19 DIAGNOSIS — M79671 Pain in right foot: Secondary | ICD-10-CM | POA: Diagnosis not present

## 2023-10-19 DIAGNOSIS — M25561 Pain in right knee: Secondary | ICD-10-CM | POA: Diagnosis not present

## 2023-10-19 DIAGNOSIS — M79672 Pain in left foot: Secondary | ICD-10-CM | POA: Diagnosis not present

## 2023-10-20 ENCOUNTER — Inpatient Hospital Stay: Admit: 2023-10-20 | Discharge: 2023-10-20 | Payer: PRIVATE HEALTH INSURANCE

## 2023-10-20 DIAGNOSIS — Z9484 Stem cells transplant status: Secondary | ICD-10-CM | POA: Diagnosis not present

## 2023-10-20 DIAGNOSIS — M5126 Other intervertebral disc displacement, lumbar region: Secondary | ICD-10-CM | POA: Diagnosis not present

## 2023-10-20 DIAGNOSIS — R109 Unspecified abdominal pain: Secondary | ICD-10-CM | POA: Diagnosis not present

## 2023-10-20 DIAGNOSIS — M47816 Spondylosis without myelopathy or radiculopathy, lumbar region: Secondary | ICD-10-CM | POA: Diagnosis not present

## 2023-10-20 DIAGNOSIS — M5136 Other intervertebral disc degeneration, lumbar region with discogenic back pain only: Secondary | ICD-10-CM | POA: Diagnosis not present

## 2023-10-20 DIAGNOSIS — M47814 Spondylosis without myelopathy or radiculopathy, thoracic region: Secondary | ICD-10-CM | POA: Diagnosis not present

## 2023-10-20 DIAGNOSIS — M48061 Spinal stenosis, lumbar region without neurogenic claudication: Secondary | ICD-10-CM | POA: Diagnosis not present

## 2023-10-25 DIAGNOSIS — M25562 Pain in left knee: Secondary | ICD-10-CM | POA: Diagnosis not present

## 2023-10-25 DIAGNOSIS — M25552 Pain in left hip: Secondary | ICD-10-CM | POA: Diagnosis not present

## 2023-10-25 DIAGNOSIS — M25551 Pain in right hip: Secondary | ICD-10-CM | POA: Diagnosis not present

## 2023-10-25 DIAGNOSIS — M79671 Pain in right foot: Secondary | ICD-10-CM | POA: Diagnosis not present

## 2023-10-25 DIAGNOSIS — M79672 Pain in left foot: Secondary | ICD-10-CM | POA: Diagnosis not present

## 2023-10-25 DIAGNOSIS — M25561 Pain in right knee: Secondary | ICD-10-CM | POA: Diagnosis not present

## 2023-10-29 DIAGNOSIS — M25551 Pain in right hip: Secondary | ICD-10-CM | POA: Diagnosis not present

## 2023-10-29 DIAGNOSIS — M25552 Pain in left hip: Secondary | ICD-10-CM | POA: Diagnosis not present

## 2023-10-29 DIAGNOSIS — M25562 Pain in left knee: Secondary | ICD-10-CM | POA: Diagnosis not present

## 2023-10-29 DIAGNOSIS — M79671 Pain in right foot: Secondary | ICD-10-CM | POA: Diagnosis not present

## 2023-10-29 DIAGNOSIS — M79672 Pain in left foot: Secondary | ICD-10-CM | POA: Diagnosis not present

## 2023-10-29 DIAGNOSIS — M25561 Pain in right knee: Secondary | ICD-10-CM | POA: Diagnosis not present

## 2023-11-02 DIAGNOSIS — M25551 Pain in right hip: Secondary | ICD-10-CM | POA: Diagnosis not present

## 2023-11-02 DIAGNOSIS — M79671 Pain in right foot: Secondary | ICD-10-CM | POA: Diagnosis not present

## 2023-11-02 DIAGNOSIS — M25552 Pain in left hip: Secondary | ICD-10-CM | POA: Diagnosis not present

## 2023-11-02 DIAGNOSIS — M25561 Pain in right knee: Secondary | ICD-10-CM | POA: Diagnosis not present

## 2023-11-02 DIAGNOSIS — M25562 Pain in left knee: Secondary | ICD-10-CM | POA: Diagnosis not present

## 2023-11-02 DIAGNOSIS — M79672 Pain in left foot: Secondary | ICD-10-CM | POA: Diagnosis not present

## 2023-11-03 ENCOUNTER — Other Ambulatory Visit: Admit: 2023-11-03 | Discharge: 2023-11-03 | Payer: PRIVATE HEALTH INSURANCE

## 2023-11-03 ENCOUNTER — Inpatient Hospital Stay: Admit: 2023-11-03 | Discharge: 2023-11-03 | Payer: PRIVATE HEALTH INSURANCE

## 2023-11-03 ENCOUNTER — Ambulatory Visit
Admit: 2023-11-03 | Discharge: 2023-11-03 | Payer: PRIVATE HEALTH INSURANCE | Attending: Primary Care | Primary: Primary Care

## 2023-11-03 DIAGNOSIS — Z9484 Stem cells transplant status: Principal | ICD-10-CM

## 2023-11-03 DIAGNOSIS — D89813 Graft-versus-host disease, unspecified: Principal | ICD-10-CM

## 2023-11-03 DIAGNOSIS — M1990 Unspecified osteoarthritis, unspecified site: Principal | ICD-10-CM

## 2023-11-03 DIAGNOSIS — M545 Acute midline low back pain without sciatica: Principal | ICD-10-CM

## 2023-11-03 DIAGNOSIS — D696 Thrombocytopenia, unspecified: Principal | ICD-10-CM

## 2023-11-03 DIAGNOSIS — Z5181 Encounter for therapeutic drug level monitoring: Principal | ICD-10-CM

## 2023-11-03 DIAGNOSIS — D84822 Immunocompromised state associated with stem cell transplant    (CMS-HCC): Principal | ICD-10-CM

## 2023-11-03 DIAGNOSIS — D473 Essential (hemorrhagic) thrombocythemia: Principal | ICD-10-CM

## 2023-11-03 DIAGNOSIS — D702 Other drug-induced agranulocytosis: Principal | ICD-10-CM

## 2023-11-03 DIAGNOSIS — D474 Osteomyelofibrosis: Principal | ICD-10-CM

## 2023-11-03 MED ORDER — TRAMADOL 50 MG TABLET
ORAL_TABLET | Freq: Three times a day (TID) | ORAL | 0 refills | 10.00000 days | Status: CP | PRN
Start: 2023-11-03 — End: 2023-12-01

## 2023-11-03 MED ORDER — TACROLIMUS 0.5 MG CAPSULE, IMMEDIATE-RELEASE
ORAL_CAPSULE | Freq: Two times a day (BID) | ORAL | 6 refills | 30.00000 days | Status: CP
Start: 2023-11-03 — End: 2024-05-31

## 2023-11-09 DIAGNOSIS — M79671 Pain in right foot: Secondary | ICD-10-CM | POA: Diagnosis not present

## 2023-11-09 DIAGNOSIS — M79672 Pain in left foot: Secondary | ICD-10-CM | POA: Diagnosis not present

## 2023-11-09 DIAGNOSIS — M25552 Pain in left hip: Secondary | ICD-10-CM | POA: Diagnosis not present

## 2023-11-09 DIAGNOSIS — M25561 Pain in right knee: Secondary | ICD-10-CM | POA: Diagnosis not present

## 2023-11-09 DIAGNOSIS — M25551 Pain in right hip: Secondary | ICD-10-CM | POA: Diagnosis not present

## 2023-11-09 DIAGNOSIS — M25562 Pain in left knee: Secondary | ICD-10-CM | POA: Diagnosis not present

## 2023-11-16 DIAGNOSIS — M25552 Pain in left hip: Secondary | ICD-10-CM | POA: Diagnosis not present

## 2023-11-16 DIAGNOSIS — M25561 Pain in right knee: Secondary | ICD-10-CM | POA: Diagnosis not present

## 2023-11-16 DIAGNOSIS — M25551 Pain in right hip: Secondary | ICD-10-CM | POA: Diagnosis not present

## 2023-11-16 DIAGNOSIS — M79672 Pain in left foot: Secondary | ICD-10-CM | POA: Diagnosis not present

## 2023-11-16 DIAGNOSIS — M25562 Pain in left knee: Secondary | ICD-10-CM | POA: Diagnosis not present

## 2023-11-16 DIAGNOSIS — M79671 Pain in right foot: Secondary | ICD-10-CM | POA: Diagnosis not present

## 2023-11-18 DIAGNOSIS — D474 Osteomyelofibrosis: Principal | ICD-10-CM

## 2023-11-18 DIAGNOSIS — Z9484 Stem cells transplant status: Principal | ICD-10-CM

## 2023-11-18 DIAGNOSIS — D473 Essential (hemorrhagic) thrombocythemia: Principal | ICD-10-CM

## 2023-11-18 MED ORDER — TACROLIMUS 0.5 MG CAPSULE, IMMEDIATE-RELEASE
ORAL_CAPSULE | ORAL | 1 refills | 0.00000 days | Status: CP
Start: 2023-11-18 — End: ?

## 2023-11-26 DIAGNOSIS — D474 Osteomyelofibrosis: Principal | ICD-10-CM

## 2023-11-26 DIAGNOSIS — D473 Essential (hemorrhagic) thrombocythemia: Principal | ICD-10-CM

## 2023-11-27 MED ORDER — GABAPENTIN 300 MG CAPSULE
ORAL_CAPSULE | 1 refills | 0.00000 days
Start: 2023-11-27 — End: ?

## 2023-11-30 ENCOUNTER — Inpatient Hospital Stay: Admit: 2023-11-30 | Discharge: 2023-11-30 | Payer: PRIVATE HEALTH INSURANCE

## 2023-11-30 ENCOUNTER — Ambulatory Visit: Admit: 2023-11-30 | Discharge: 2023-11-30 | Payer: PRIVATE HEALTH INSURANCE

## 2023-11-30 ENCOUNTER — Other Ambulatory Visit: Admit: 2023-11-30 | Discharge: 2023-11-30 | Payer: PRIVATE HEALTH INSURANCE

## 2023-11-30 DIAGNOSIS — Z9484 Stem cells transplant status: Principal | ICD-10-CM

## 2023-11-30 DIAGNOSIS — D473 Essential (hemorrhagic) thrombocythemia: Principal | ICD-10-CM

## 2023-11-30 DIAGNOSIS — D474 Osteomyelofibrosis: Principal | ICD-10-CM

## 2023-11-30 DIAGNOSIS — D89813 Graft-versus-host disease, unspecified: Principal | ICD-10-CM

## 2023-11-30 DIAGNOSIS — Z79621 Long term (current) use of calcineurin inhibitor: Secondary | ICD-10-CM | POA: Diagnosis not present

## 2023-11-30 DIAGNOSIS — D7581 Myelofibrosis: Secondary | ICD-10-CM | POA: Diagnosis not present

## 2023-11-30 DIAGNOSIS — D61818 Other pancytopenia: Secondary | ICD-10-CM | POA: Diagnosis not present

## 2023-11-30 DIAGNOSIS — M1610 Unilateral primary osteoarthritis, unspecified hip: Secondary | ICD-10-CM | POA: Diagnosis not present

## 2023-11-30 DIAGNOSIS — D84822 Immunodeficiency due to external causes: Secondary | ICD-10-CM | POA: Diagnosis not present

## 2023-11-30 MED ORDER — MG-PLUS-PROTEIN 133 MG TABLET
ORAL_TABLET | Freq: Two times a day (BID) | ORAL | 0 refills | 100.00000 days | Status: CP
Start: 2023-11-30 — End: 2023-11-30
  Filled 2023-11-30: qty 100, 50d supply, fill #0

## 2023-11-30 MED ORDER — GABAPENTIN 300 MG CAPSULE
ORAL_CAPSULE | ORAL | 1 refills | 0.00000 days | Status: CP
Start: 2023-11-30 — End: ?

## 2023-12-01 DIAGNOSIS — M79671 Pain in right foot: Secondary | ICD-10-CM | POA: Diagnosis not present

## 2023-12-01 DIAGNOSIS — M25561 Pain in right knee: Secondary | ICD-10-CM | POA: Diagnosis not present

## 2023-12-01 DIAGNOSIS — M79672 Pain in left foot: Secondary | ICD-10-CM | POA: Diagnosis not present

## 2023-12-01 DIAGNOSIS — M25552 Pain in left hip: Secondary | ICD-10-CM | POA: Diagnosis not present

## 2023-12-01 DIAGNOSIS — M25551 Pain in right hip: Secondary | ICD-10-CM | POA: Diagnosis not present

## 2023-12-01 DIAGNOSIS — M25562 Pain in left knee: Secondary | ICD-10-CM | POA: Diagnosis not present

## 2023-12-02 DIAGNOSIS — N958 Other specified menopausal and perimenopausal disorders: Principal | ICD-10-CM

## 2023-12-02 DIAGNOSIS — N9089 Other specified noninflammatory disorders of vulva and perineum: Principal | ICD-10-CM

## 2023-12-02 MED ORDER — ESTRADIOL 0.01% (0.1 MG/GRAM) VAGINAL CREAM
1 refills | 0.00000 days | Status: CP
Start: 2023-12-02 — End: ?

## 2023-12-06 DIAGNOSIS — D473 Essential (hemorrhagic) thrombocythemia: Principal | ICD-10-CM

## 2023-12-06 DIAGNOSIS — D474 Osteomyelofibrosis: Principal | ICD-10-CM

## 2023-12-12 DIAGNOSIS — D89813 Graft-versus-host disease, unspecified: Principal | ICD-10-CM

## 2023-12-12 MED ORDER — MONTELUKAST 10 MG TABLET
ORAL_TABLET | Freq: Every evening | ORAL | 0 refills | 30.00000 days | Status: CP
Start: 2023-12-12 — End: ?

## 2023-12-14 DIAGNOSIS — Z9484 Stem cells transplant status: Principal | ICD-10-CM

## 2023-12-14 DIAGNOSIS — D473 Essential (hemorrhagic) thrombocythemia: Principal | ICD-10-CM

## 2023-12-14 DIAGNOSIS — D474 Osteomyelofibrosis: Principal | ICD-10-CM

## 2023-12-14 DIAGNOSIS — M25562 Pain in left knee: Secondary | ICD-10-CM | POA: Diagnosis not present

## 2023-12-14 DIAGNOSIS — M79672 Pain in left foot: Secondary | ICD-10-CM | POA: Diagnosis not present

## 2023-12-14 DIAGNOSIS — M25552 Pain in left hip: Secondary | ICD-10-CM | POA: Diagnosis not present

## 2023-12-14 DIAGNOSIS — M25561 Pain in right knee: Secondary | ICD-10-CM | POA: Diagnosis not present

## 2023-12-14 DIAGNOSIS — M25551 Pain in right hip: Secondary | ICD-10-CM | POA: Diagnosis not present

## 2023-12-14 DIAGNOSIS — M79671 Pain in right foot: Secondary | ICD-10-CM | POA: Diagnosis not present

## 2023-12-21 DIAGNOSIS — M25561 Pain in right knee: Secondary | ICD-10-CM | POA: Diagnosis not present

## 2023-12-21 DIAGNOSIS — M25562 Pain in left knee: Secondary | ICD-10-CM | POA: Diagnosis not present

## 2023-12-21 DIAGNOSIS — M25551 Pain in right hip: Secondary | ICD-10-CM | POA: Diagnosis not present

## 2023-12-21 DIAGNOSIS — M79671 Pain in right foot: Secondary | ICD-10-CM | POA: Diagnosis not present

## 2023-12-21 DIAGNOSIS — M79672 Pain in left foot: Secondary | ICD-10-CM | POA: Diagnosis not present

## 2023-12-21 DIAGNOSIS — M25552 Pain in left hip: Secondary | ICD-10-CM | POA: Diagnosis not present

## 2023-12-23 DIAGNOSIS — M25562 Pain in left knee: Secondary | ICD-10-CM | POA: Diagnosis not present

## 2023-12-23 DIAGNOSIS — M25561 Pain in right knee: Secondary | ICD-10-CM | POA: Diagnosis not present

## 2023-12-23 DIAGNOSIS — M25552 Pain in left hip: Secondary | ICD-10-CM | POA: Diagnosis not present

## 2023-12-23 DIAGNOSIS — M79671 Pain in right foot: Secondary | ICD-10-CM | POA: Diagnosis not present

## 2023-12-23 DIAGNOSIS — M25551 Pain in right hip: Secondary | ICD-10-CM | POA: Diagnosis not present

## 2023-12-23 DIAGNOSIS — M79672 Pain in left foot: Secondary | ICD-10-CM | POA: Diagnosis not present

## 2023-12-27 DIAGNOSIS — M79672 Pain in left foot: Secondary | ICD-10-CM | POA: Diagnosis not present

## 2023-12-27 DIAGNOSIS — M25561 Pain in right knee: Secondary | ICD-10-CM | POA: Diagnosis not present

## 2023-12-27 DIAGNOSIS — M25562 Pain in left knee: Secondary | ICD-10-CM | POA: Diagnosis not present

## 2023-12-27 DIAGNOSIS — M25552 Pain in left hip: Secondary | ICD-10-CM | POA: Diagnosis not present

## 2023-12-27 DIAGNOSIS — M25551 Pain in right hip: Secondary | ICD-10-CM | POA: Diagnosis not present

## 2023-12-27 DIAGNOSIS — M79671 Pain in right foot: Secondary | ICD-10-CM | POA: Diagnosis not present

## 2023-12-29 ENCOUNTER — Other Ambulatory Visit: Admit: 2023-12-29 | Discharge: 2023-12-30 | Payer: PRIVATE HEALTH INSURANCE

## 2023-12-29 ENCOUNTER — Inpatient Hospital Stay: Admit: 2023-12-29 | Discharge: 2023-12-30 | Payer: PRIVATE HEALTH INSURANCE

## 2023-12-29 ENCOUNTER — Ambulatory Visit
Admit: 2023-12-29 | Discharge: 2023-12-30 | Payer: PRIVATE HEALTH INSURANCE | Attending: Primary Care | Primary: Primary Care

## 2023-12-29 DIAGNOSIS — Z9484 Stem cells transplant status: Principal | ICD-10-CM

## 2023-12-29 DIAGNOSIS — D84822 Immunocompromised state associated with stem cell transplant    (CMS-HCC): Principal | ICD-10-CM

## 2023-12-29 DIAGNOSIS — D474 Osteomyelofibrosis: Principal | ICD-10-CM

## 2023-12-29 DIAGNOSIS — M2550 Pain in unspecified joint: Principal | ICD-10-CM

## 2023-12-29 DIAGNOSIS — D473 Essential (hemorrhagic) thrombocythemia: Principal | ICD-10-CM

## 2023-12-29 DIAGNOSIS — M1990 Unspecified osteoarthritis, unspecified site: Principal | ICD-10-CM

## 2023-12-29 DIAGNOSIS — R7989 Other specified abnormal findings of blood chemistry: Principal | ICD-10-CM

## 2023-12-29 DIAGNOSIS — G8929 Other chronic pain: Principal | ICD-10-CM

## 2023-12-29 DIAGNOSIS — D89813 Graft-versus-host disease, unspecified: Principal | ICD-10-CM

## 2023-12-29 DIAGNOSIS — M47816 Spondylosis without myelopathy or radiculopathy, lumbar region: Secondary | ICD-10-CM | POA: Diagnosis not present

## 2023-12-29 DIAGNOSIS — M791 Myalgia, unspecified site: Secondary | ICD-10-CM | POA: Diagnosis not present

## 2023-12-29 DIAGNOSIS — D7581 Myelofibrosis: Secondary | ICD-10-CM | POA: Diagnosis not present

## 2023-12-29 DIAGNOSIS — M79644 Pain in right finger(s): Secondary | ICD-10-CM | POA: Diagnosis not present

## 2023-12-29 DIAGNOSIS — Z79621 Long term (current) use of calcineurin inhibitor: Secondary | ICD-10-CM | POA: Diagnosis not present

## 2023-12-29 DIAGNOSIS — M1610 Unilateral primary osteoarthritis, unspecified hip: Secondary | ICD-10-CM | POA: Diagnosis not present

## 2023-12-29 DIAGNOSIS — T865 Complications of stem cell transplant: Secondary | ICD-10-CM | POA: Diagnosis not present

## 2023-12-29 DIAGNOSIS — D89811 Chronic graft-versus-host disease: Secondary | ICD-10-CM | POA: Diagnosis not present

## 2023-12-30 DIAGNOSIS — Z9484 Stem cells transplant status: Principal | ICD-10-CM

## 2023-12-30 DIAGNOSIS — M25552 Pain in left hip: Secondary | ICD-10-CM | POA: Diagnosis not present

## 2023-12-30 DIAGNOSIS — M25562 Pain in left knee: Secondary | ICD-10-CM | POA: Diagnosis not present

## 2023-12-30 DIAGNOSIS — M79672 Pain in left foot: Secondary | ICD-10-CM | POA: Diagnosis not present

## 2023-12-30 DIAGNOSIS — M79671 Pain in right foot: Secondary | ICD-10-CM | POA: Diagnosis not present

## 2023-12-30 DIAGNOSIS — M25551 Pain in right hip: Secondary | ICD-10-CM | POA: Diagnosis not present

## 2023-12-30 DIAGNOSIS — M25561 Pain in right knee: Secondary | ICD-10-CM | POA: Diagnosis not present

## 2023-12-30 MED ORDER — SERTRALINE 25 MG TABLET
ORAL_TABLET | Freq: Every day | ORAL | 1 refills | 90.00000 days | Status: CP
Start: 2023-12-30 — End: ?

## 2024-01-04 DIAGNOSIS — M25562 Pain in left knee: Secondary | ICD-10-CM | POA: Diagnosis not present

## 2024-01-04 DIAGNOSIS — M79671 Pain in right foot: Secondary | ICD-10-CM | POA: Diagnosis not present

## 2024-01-04 DIAGNOSIS — M25551 Pain in right hip: Secondary | ICD-10-CM | POA: Diagnosis not present

## 2024-01-04 DIAGNOSIS — M79672 Pain in left foot: Secondary | ICD-10-CM | POA: Diagnosis not present

## 2024-01-04 DIAGNOSIS — M25561 Pain in right knee: Secondary | ICD-10-CM | POA: Diagnosis not present

## 2024-01-04 DIAGNOSIS — M25552 Pain in left hip: Secondary | ICD-10-CM | POA: Diagnosis not present

## 2024-01-07 DIAGNOSIS — M79672 Pain in left foot: Secondary | ICD-10-CM | POA: Diagnosis not present

## 2024-01-07 DIAGNOSIS — M25561 Pain in right knee: Secondary | ICD-10-CM | POA: Diagnosis not present

## 2024-01-07 DIAGNOSIS — M25562 Pain in left knee: Secondary | ICD-10-CM | POA: Diagnosis not present

## 2024-01-07 DIAGNOSIS — M25551 Pain in right hip: Secondary | ICD-10-CM | POA: Diagnosis not present

## 2024-01-07 DIAGNOSIS — M79671 Pain in right foot: Secondary | ICD-10-CM | POA: Diagnosis not present

## 2024-01-07 DIAGNOSIS — M25552 Pain in left hip: Secondary | ICD-10-CM | POA: Diagnosis not present

## 2024-01-09 DIAGNOSIS — D89813 Graft-versus-host disease, unspecified: Principal | ICD-10-CM

## 2024-01-09 MED ORDER — MONTELUKAST 10 MG TABLET
ORAL_TABLET | Freq: Every evening | ORAL | 0 refills | 30.00000 days | Status: CP
Start: 2024-01-09 — End: ?

## 2024-01-13 DIAGNOSIS — D474 Osteomyelofibrosis: Principal | ICD-10-CM

## 2024-01-13 DIAGNOSIS — Z9484 Stem cells transplant status: Principal | ICD-10-CM

## 2024-01-13 DIAGNOSIS — D473 Essential (hemorrhagic) thrombocythemia: Principal | ICD-10-CM

## 2024-01-17 DIAGNOSIS — D474 Osteomyelofibrosis: Principal | ICD-10-CM

## 2024-01-17 DIAGNOSIS — Z9484 Stem cells transplant status: Principal | ICD-10-CM

## 2024-01-17 DIAGNOSIS — D473 Essential (hemorrhagic) thrombocythemia: Principal | ICD-10-CM

## 2024-01-17 MED ORDER — JAKAFI 5 MG TABLET
ORAL_TABLET | Freq: Two times a day (BID) | ORAL | 5 refills | 0.00000 days
Start: 2024-01-17 — End: ?

## 2024-01-17 MED ORDER — TACROLIMUS 0.5 MG CAPSULE, IMMEDIATE-RELEASE
ORAL_CAPSULE | 1 refills | 0.00000 days
Start: 2024-01-17 — End: ?

## 2024-01-18 MED ORDER — TACROLIMUS 0.5 MG CAPSULE, IMMEDIATE-RELEASE
ORAL_CAPSULE | ORAL | 1 refills | 0.00000 days | Status: CP
Start: 2024-01-18 — End: ?

## 2024-01-18 MED ORDER — JAKAFI 5 MG TABLET
ORAL_TABLET | Freq: Two times a day (BID) | ORAL | 5 refills | 30.00000 days | Status: CP
Start: 2024-01-18 — End: ?

## 2024-01-24 ENCOUNTER — Other Ambulatory Visit: Payer: Self-pay | Admitting: Family Medicine

## 2024-01-24 DIAGNOSIS — D474 Osteomyelofibrosis: Principal | ICD-10-CM

## 2024-01-24 DIAGNOSIS — D473 Essential (hemorrhagic) thrombocythemia: Principal | ICD-10-CM

## 2024-01-24 DIAGNOSIS — Z1231 Encounter for screening mammogram for malignant neoplasm of breast: Secondary | ICD-10-CM

## 2024-01-25 ENCOUNTER — Inpatient Hospital Stay: Admit: 2024-01-25 | Discharge: 2024-01-25 | Payer: PRIVATE HEALTH INSURANCE

## 2024-01-25 ENCOUNTER — Other Ambulatory Visit: Admit: 2024-01-25 | Discharge: 2024-01-25 | Payer: PRIVATE HEALTH INSURANCE

## 2024-01-25 ENCOUNTER — Ambulatory Visit: Admit: 2024-01-25 | Discharge: 2024-01-25 | Payer: PRIVATE HEALTH INSURANCE

## 2024-01-25 DIAGNOSIS — Z9484 Stem cells transplant status: Principal | ICD-10-CM

## 2024-01-25 DIAGNOSIS — D474 Osteomyelofibrosis: Principal | ICD-10-CM

## 2024-01-25 DIAGNOSIS — D473 Essential (hemorrhagic) thrombocythemia: Principal | ICD-10-CM

## 2024-01-25 DIAGNOSIS — D89813 Graft-versus-host disease, unspecified: Principal | ICD-10-CM

## 2024-01-25 DIAGNOSIS — T451X5A Adverse effect of antineoplastic and immunosuppressive drugs, initial encounter: Principal | ICD-10-CM

## 2024-01-25 DIAGNOSIS — D701 Agranulocytosis secondary to cancer chemotherapy: Principal | ICD-10-CM

## 2024-01-25 MED ORDER — MONTELUKAST 10 MG TABLET
ORAL_TABLET | Freq: Every evening | ORAL | 1 refills | 90.00000 days | Status: CP
Start: 2024-01-25 — End: ?

## 2024-01-25 MED ORDER — TACROLIMUS 0.5 MG CAPSULE, IMMEDIATE-RELEASE
ORAL_CAPSULE | ORAL | 1 refills | 0.00000 days | Status: CP
Start: 2024-01-25 — End: ?

## 2024-01-25 MED ORDER — SERTRALINE 25 MG TABLET
ORAL_TABLET | Freq: Every day | ORAL | 1 refills | 90.00000 days | Status: CP
Start: 2024-01-25 — End: ?

## 2024-01-25 MED ORDER — PANTOPRAZOLE 20 MG TABLET,DELAYED RELEASE
ORAL_TABLET | Freq: Every day | ORAL | 1 refills | 90.00000 days | Status: CP
Start: 2024-01-25 — End: ?

## 2024-01-28 DIAGNOSIS — D473 Essential (hemorrhagic) thrombocythemia: Principal | ICD-10-CM

## 2024-01-28 DIAGNOSIS — Z9484 Stem cells transplant status: Principal | ICD-10-CM

## 2024-01-28 DIAGNOSIS — D649 Anemia, unspecified: Principal | ICD-10-CM

## 2024-01-28 DIAGNOSIS — D474 Osteomyelofibrosis: Principal | ICD-10-CM

## 2024-01-28 MED ORDER — VALACYCLOVIR 500 MG TABLET
ORAL_TABLET | Freq: Two times a day (BID) | ORAL | 11 refills | 30.00000 days | Status: CP
Start: 2024-01-28 — End: ?

## 2024-01-31 DIAGNOSIS — D474 Osteomyelofibrosis: Principal | ICD-10-CM

## 2024-01-31 DIAGNOSIS — D473 Essential (hemorrhagic) thrombocythemia: Principal | ICD-10-CM

## 2024-02-07 ENCOUNTER — Ambulatory Visit

## 2024-02-14 ENCOUNTER — Ambulatory Visit

## 2024-02-25 ENCOUNTER — Ambulatory Visit
